# Patient Record
Sex: Male | Born: 1945 | Race: White | Hispanic: No | Marital: Married | Smoking: Former smoker
Health system: Southern US, Community
[De-identification: ages and names within clinical notes are randomized; demographics above are authoritative.]

## PROBLEM LIST (undated history)

## (undated) DIAGNOSIS — M1990 Unspecified osteoarthritis, unspecified site: Secondary | ICD-10-CM

## (undated) DIAGNOSIS — K219 Gastro-esophageal reflux disease without esophagitis: Secondary | ICD-10-CM

## (undated) DIAGNOSIS — E785 Hyperlipidemia, unspecified: Secondary | ICD-10-CM

## (undated) DIAGNOSIS — E7800 Pure hypercholesterolemia, unspecified: Secondary | ICD-10-CM

## (undated) DIAGNOSIS — I10 Essential (primary) hypertension: Secondary | ICD-10-CM

## (undated) DIAGNOSIS — C801 Malignant (primary) neoplasm, unspecified: Secondary | ICD-10-CM

## (undated) DIAGNOSIS — I517 Cardiomegaly: Secondary | ICD-10-CM

## (undated) DIAGNOSIS — H269 Unspecified cataract: Secondary | ICD-10-CM

## (undated) DIAGNOSIS — J309 Allergic rhinitis, unspecified: Secondary | ICD-10-CM

## (undated) DIAGNOSIS — R569 Unspecified convulsions: Secondary | ICD-10-CM

## (undated) DIAGNOSIS — E669 Obesity, unspecified: Secondary | ICD-10-CM

## (undated) DIAGNOSIS — I82409 Acute embolism and thrombosis of unspecified deep veins of unspecified lower extremity: Secondary | ICD-10-CM

## (undated) DIAGNOSIS — I6523 Occlusion and stenosis of bilateral carotid arteries: Secondary | ICD-10-CM

## (undated) HISTORY — DX: Cardiomegaly: I51.7

## (undated) HISTORY — DX: Obesity, unspecified: E66.9

## (undated) HISTORY — DX: Unspecified osteoarthritis, unspecified site: M19.90

## (undated) HISTORY — DX: Unspecified cataract: H26.9

## (undated) HISTORY — PX: CATARACT EXTRACTION, BILATERAL: SHX1313

## (undated) HISTORY — DX: Essential (primary) hypertension: I10

## (undated) HISTORY — DX: Acute embolism and thrombosis of unspecified deep veins of unspecified lower extremity: I82.409

## (undated) HISTORY — DX: Allergic rhinitis, unspecified: J30.9

## (undated) HISTORY — DX: Occlusion and stenosis of bilateral carotid arteries: I65.23

## (undated) HISTORY — DX: Hyperlipidemia, unspecified: E78.5

## (undated) HISTORY — DX: Pure hypercholesterolemia, unspecified: E78.00

---
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
## 2008-04-03 HISTORY — PX: SKIN CANCER EXCISION: SHX779

## 2012-11-22 DIAGNOSIS — I82409 Acute embolism and thrombosis of unspecified deep veins of unspecified lower extremity: Secondary | ICD-10-CM

## 2012-11-22 HISTORY — DX: Acute embolism and thrombosis of unspecified deep veins of unspecified lower extremity: I82.409

## 2013-04-03 HISTORY — PX: TOTAL KNEE ARTHROPLASTY: SHX125

## 2013-04-03 HISTORY — PX: COLONOSCOPY: SHX174

## 2015-04-07 LAB — HEPATIC FUNCTION PANEL
ALT: 54 U/L — AB (ref 10–40)
AST: 31 U/L (ref 14–40)
Alkaline Phosphatase: 73 U/L (ref 25–125)
BILIRUBIN, TOTAL: 0.6 mg/dL

## 2015-04-07 LAB — BASIC METABOLIC PANEL
BUN: 16 mg/dL (ref 4–21)
Creatinine: 1.3 mg/dL (ref 0.6–1.3)
GLUCOSE: 120 mg/dL
Potassium: 4.2 mmol/L (ref 3.4–5.3)
SODIUM: 140 mmol/L (ref 137–147)

## 2015-04-07 LAB — LIPID PANEL
Cholesterol: 155 mg/dL (ref 0–200)
HDL: 30 mg/dL — AB (ref 35–70)
LDL Cholesterol: 90 mg/dL
Triglycerides: 175 mg/dL — AB (ref 40–160)

## 2015-04-07 LAB — CBC AND DIFFERENTIAL
HEMATOCRIT: 47 % (ref 41–53)
HEMOGLOBIN: 16 g/dL (ref 13.5–17.5)
PLATELETS: 276 10*3/uL (ref 150–399)
WBC: 6.3 10*3/mL

## 2015-04-07 LAB — TSH: TSH: 2.26 u[IU]/mL (ref 0.41–5.90)

## 2015-07-09 LAB — HEPATIC FUNCTION PANEL
ALT: 61 U/L — AB (ref 10–40)
AST: 36 U/L (ref 14–40)
Alkaline Phosphatase: 79 U/L (ref 25–125)
BILIRUBIN, TOTAL: 0.6 mg/dL

## 2015-07-09 LAB — BASIC METABOLIC PANEL
BUN: 15 mg/dL (ref 4–21)
Creatinine: 1.2 mg/dL (ref 0.6–1.3)
Glucose: 132 mg/dL
POTASSIUM: 4.2 mmol/L (ref 3.4–5.3)
SODIUM: 141 mmol/L (ref 137–147)

## 2015-07-09 LAB — LIPID PANEL
Cholesterol: 163 mg/dL (ref 0–200)
HDL: 32 mg/dL — AB (ref 35–70)
LDL CALC: 94 mg/dL
TRIGLYCERIDES: 185 mg/dL — AB (ref 40–160)

## 2015-07-09 LAB — CBC AND DIFFERENTIAL
HEMATOCRIT: 49 % (ref 41–53)
Hemoglobin: 16.9 g/dL (ref 13.5–17.5)
Platelets: 285 10*3/uL (ref 150–399)
WBC: 9.5 10^3/mL

## 2015-10-12 DIAGNOSIS — I517 Cardiomegaly: Secondary | ICD-10-CM | POA: Diagnosis not present

## 2015-10-12 DIAGNOSIS — E669 Obesity, unspecified: Secondary | ICD-10-CM | POA: Diagnosis not present

## 2015-10-12 DIAGNOSIS — I6523 Occlusion and stenosis of bilateral carotid arteries: Secondary | ICD-10-CM | POA: Diagnosis not present

## 2015-10-12 DIAGNOSIS — Z1389 Encounter for screening for other disorder: Secondary | ICD-10-CM | POA: Diagnosis not present

## 2015-10-12 DIAGNOSIS — I10 Essential (primary) hypertension: Secondary | ICD-10-CM | POA: Diagnosis not present

## 2015-10-12 DIAGNOSIS — Z136 Encounter for screening for cardiovascular disorders: Secondary | ICD-10-CM | POA: Diagnosis not present

## 2015-10-12 DIAGNOSIS — Z6836 Body mass index (BMI) 36.0-36.9, adult: Secondary | ICD-10-CM | POA: Diagnosis not present

## 2015-10-12 DIAGNOSIS — E7800 Pure hypercholesterolemia, unspecified: Secondary | ICD-10-CM | POA: Diagnosis not present

## 2015-10-12 DIAGNOSIS — Z125 Encounter for screening for malignant neoplasm of prostate: Secondary | ICD-10-CM | POA: Diagnosis not present

## 2015-10-12 DIAGNOSIS — I80203 Phlebitis and thrombophlebitis of unspecified deep vessels of lower extremities, bilateral: Secondary | ICD-10-CM | POA: Diagnosis not present

## 2015-10-12 DIAGNOSIS — Z0000 Encounter for general adult medical examination without abnormal findings: Secondary | ICD-10-CM | POA: Diagnosis not present

## 2015-10-12 LAB — HEPATIC FUNCTION PANEL
ALT: 56 U/L — AB (ref 10–40)
AST: 33 U/L (ref 14–40)
Alkaline Phosphatase: 67 U/L (ref 25–125)
Bilirubin, Total: 0.5 mg/dL

## 2015-10-12 LAB — LIPID PANEL
CHOLESTEROL: 148 mg/dL (ref 0–200)
HDL: 35 mg/dL (ref 35–70)
LDL Cholesterol: 99 mg/dL
TRIGLYCERIDES: 138 mg/dL (ref 40–160)

## 2015-10-12 LAB — BASIC METABOLIC PANEL
BUN: 10 mg/dL (ref 4–21)
CREATININE: 1 mg/dL (ref 0.6–1.3)
Glucose: 123 mg/dL
POTASSIUM: 4.2 mmol/L (ref 3.4–5.3)
Sodium: 140 mmol/L (ref 137–147)

## 2015-10-12 LAB — CBC AND DIFFERENTIAL
HCT: 48 % (ref 41–53)
HEMOGLOBIN: 16.3 g/dL (ref 13.5–17.5)
PLATELETS: 318 10*3/uL (ref 150–399)
WBC: 7 10^3/mL

## 2015-10-12 LAB — PSA: PSA: 2.79

## 2015-10-12 LAB — TSH: TSH: 2.01 u[IU]/mL (ref 0.41–5.90)

## 2015-10-12 LAB — HEMOGLOBIN A1C: HEMOGLOBIN A1C: 6.34

## 2016-02-16 DIAGNOSIS — Z23 Encounter for immunization: Secondary | ICD-10-CM | POA: Diagnosis not present

## 2016-03-22 ENCOUNTER — Non-Acute Institutional Stay: Payer: Medicare Other | Admitting: Internal Medicine

## 2016-03-22 ENCOUNTER — Encounter: Payer: Self-pay | Admitting: Internal Medicine

## 2016-03-22 VITALS — BP 120/60 | HR 83 | Temp 98.3°F | Ht 72.0 in | Wt 245.0 lb

## 2016-03-22 DIAGNOSIS — G250 Essential tremor: Secondary | ICD-10-CM

## 2016-03-22 DIAGNOSIS — Z860100 Personal history of colon polyps, unspecified: Secondary | ICD-10-CM

## 2016-03-22 DIAGNOSIS — I10 Essential (primary) hypertension: Secondary | ICD-10-CM | POA: Diagnosis not present

## 2016-03-22 DIAGNOSIS — E7800 Pure hypercholesterolemia, unspecified: Secondary | ICD-10-CM

## 2016-03-22 DIAGNOSIS — M5442 Lumbago with sciatica, left side: Secondary | ICD-10-CM | POA: Diagnosis not present

## 2016-03-22 DIAGNOSIS — M25552 Pain in left hip: Secondary | ICD-10-CM

## 2016-03-22 DIAGNOSIS — G8929 Other chronic pain: Secondary | ICD-10-CM

## 2016-03-22 DIAGNOSIS — R739 Hyperglycemia, unspecified: Secondary | ICD-10-CM | POA: Diagnosis not present
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
## 2016-03-23 DIAGNOSIS — E7800 Pure hypercholesterolemia, unspecified: Secondary | ICD-10-CM | POA: Diagnosis not present

## 2016-03-23 DIAGNOSIS — I10 Essential (primary) hypertension: Secondary | ICD-10-CM | POA: Diagnosis not present

## 2016-03-23 DIAGNOSIS — G250 Essential tremor: Secondary | ICD-10-CM | POA: Diagnosis not present

## 2016-03-23 DIAGNOSIS — R739 Hyperglycemia, unspecified: Secondary | ICD-10-CM | POA: Diagnosis not present

## 2016-03-23 LAB — CBC AND DIFFERENTIAL
HCT: 48 % (ref 41–53)
Hemoglobin: 16.2 g/dL (ref 13.5–17.5)
Platelets: 256 10*3/uL (ref 150–399)
WBC: 6.7 10^3/mL

## 2016-03-24 DIAGNOSIS — R739 Hyperglycemia, unspecified: Secondary | ICD-10-CM | POA: Diagnosis not present

## 2016-03-24 DIAGNOSIS — E7800 Pure hypercholesterolemia, unspecified: Secondary | ICD-10-CM | POA: Diagnosis not present

## 2016-03-24 DIAGNOSIS — I10 Essential (primary) hypertension: Secondary | ICD-10-CM | POA: Diagnosis not present

## 2016-03-24 DIAGNOSIS — G250 Essential tremor: Secondary | ICD-10-CM | POA: Diagnosis not present

## 2016-03-24 LAB — HEPATIC FUNCTION PANEL
ALT: 38 U/L (ref 10–40)
AST: 25 U/L (ref 14–40)
Alkaline Phosphatase: 64 U/L (ref 25–125)
Bilirubin, Total: 0.5 mg/dL

## 2016-03-24 LAB — BASIC METABOLIC PANEL
BUN: 17 mg/dL (ref 4–21)
Creatinine: 1.1 mg/dL (ref 0.6–1.3)
Glucose: 134 mg/dL
Potassium: 4.2 mmol/L (ref 3.4–5.3)
Sodium: 142 mmol/L (ref 137–147)

## 2016-03-24 LAB — HEMOGLOBIN A1C: Hemoglobin A1C: 6.2

## 2016-03-24 LAB — LIPID PANEL
Cholesterol: 168 mg/dL (ref 0–200)
HDL: 36 mg/dL (ref 35–70)
LDL Cholesterol: 98 mg/dL
Triglycerides: 170 mg/dL — AB (ref 40–160)

## 2016-04-04 ENCOUNTER — Encounter: Payer: Self-pay | Admitting: Internal Medicine

## 2016-04-05 ENCOUNTER — Ambulatory Visit
Admission: RE | Admit: 2016-04-05 | Discharge: 2016-04-05 | Disposition: A | Discharge status: Home / Self Care | Payer: Medicare Other | Source: Ambulatory Visit | Attending: Internal Medicine | Admitting: Internal Medicine

## 2016-04-05 ENCOUNTER — Other Ambulatory Visit: Payer: Self-pay | Admitting: Internal Medicine

## 2016-04-05 DIAGNOSIS — M5442 Lumbago with sciatica, left side: Principal | ICD-10-CM

## 2016-04-05 DIAGNOSIS — G8929 Other chronic pain: Secondary | ICD-10-CM

## 2016-04-05 DIAGNOSIS — M25552 Pain in left hip: Secondary | ICD-10-CM

## 2016-04-05 DIAGNOSIS — M5126 Other intervertebral disc displacement, lumbar region: Secondary | ICD-10-CM | POA: Diagnosis not present

## 2016-04-05 DIAGNOSIS — M545 Low back pain: Principal | ICD-10-CM

## 2016-04-05 DIAGNOSIS — M1612 Unilateral primary osteoarthritis, left hip: Secondary | ICD-10-CM | POA: Diagnosis not present

## 2016-04-05 NOTE — Addendum Note (Signed)
Addended by: Logan Bores on: 04/05/2016 03:32 PM   Modules accepted: Orders

## 2016-04-11 ENCOUNTER — Encounter: Payer: Self-pay | Admitting: Internal Medicine

## 2016-04-11 ENCOUNTER — Other Ambulatory Visit: Payer: Self-pay | Admitting: Internal Medicine

## 2016-04-11 MED ORDER — GABAPENTIN 100 MG PO CAPS
100.0000 mg | ORAL_CAPSULE | Freq: Every day | ORAL | 0 refills | Status: DC
Start: 2016-04-11 — End: 2016-05-08

## 2016-04-11 MED ORDER — LOSARTAN POTASSIUM-HCTZ 100-12.5 MG PO TABS: 1.0000 | ORAL_TABLET | Freq: Every day | ORAL | 3 refills | Status: DC

## 2016-04-11 MED ORDER — ATORVASTATIN CALCIUM 20 MG PO TABS: 20.0000 mg | ORAL_TABLET | Freq: Every day | ORAL | 3 refills | Status: DC

## 2016-05-08 ENCOUNTER — Encounter: Payer: Self-pay | Admitting: Internal Medicine

## 2016-05-08 MED ORDER — GABAPENTIN 100 MG PO CAPS
100.0000 mg | ORAL_CAPSULE | Freq: Every day | ORAL | 0 refills | Status: DC
Start: 2016-05-08 — End: 2016-06-07

## 2016-06-07 ENCOUNTER — Other Ambulatory Visit: Payer: Self-pay | Admitting: Internal Medicine

## 2016-06-07 MED ORDER — GABAPENTIN 100 MG PO CAPS: 100.0000 mg | ORAL_CAPSULE | Freq: Every day | ORAL | 3 refills | Status: DC

## 2016-06-07 NOTE — Telephone Encounter (Signed)
Patient requested a refill via MyChart. Rx was sent to pharmacy electronically for gabapentin 100 mg. #30 with 3 RF.

## 2016-06-21 ENCOUNTER — Encounter: Payer: Self-pay | Admitting: Internal Medicine

## 2016-06-21 ENCOUNTER — Non-Acute Institutional Stay: Payer: Medicare Other | Admitting: Internal Medicine

## 2016-06-21 VITALS — BP 108/60 | HR 79 | Temp 97.9°F | Wt 238.0 lb

## 2016-06-21 DIAGNOSIS — I8390 Asymptomatic varicose veins of unspecified lower extremity: Secondary | ICD-10-CM | POA: Diagnosis not present

## 2016-06-21 DIAGNOSIS — I10 Essential (primary) hypertension: Secondary | ICD-10-CM

## 2016-06-21 DIAGNOSIS — G8929 Other chronic pain: Secondary | ICD-10-CM

## 2016-06-21 DIAGNOSIS — E66811 Obesity, class 1: Secondary | ICD-10-CM

## 2016-06-21 DIAGNOSIS — E669 Obesity, unspecified: Secondary | ICD-10-CM | POA: Diagnosis not present

## 2016-06-21 DIAGNOSIS — R739 Hyperglycemia, unspecified: Secondary | ICD-10-CM

## 2016-06-21 DIAGNOSIS — M5442 Lumbago with sciatica, left side: Secondary | ICD-10-CM

## 2016-06-21 DIAGNOSIS — M25552 Pain in left hip: Secondary | ICD-10-CM

## 2016-06-21 DIAGNOSIS — E7800 Pure hypercholesterolemia, unspecified: Secondary | ICD-10-CM | POA: Diagnosis not present

## 2016-06-21 MED ORDER — ATORVASTATIN CALCIUM 20 MG PO TABS: 20.0000 mg | ORAL_TABLET | Freq: Every day | ORAL | 3 refills | Status: DC

## 2016-06-21 MED ORDER — GABAPENTIN 100 MG PO CAPS: 100.0000 mg | ORAL_CAPSULE | Freq: Every day | ORAL | 3 refills | Status: DC

## 2016-06-21 MED ORDER — LOSARTAN POTASSIUM-HCTZ 100-12.5 MG PO TABS: 1.0000 | ORAL_TABLET | Freq: Every day | ORAL | 3 refills | Status: DC

## 2016-06-21 NOTE — Progress Notes (Signed)
Location:  Occupational psychologist of Service:  Clinic (12)  Provider: Khristina Janota L. Mariea Clonts, D.O., C.M.D.  Code Status: full code--had discussion with him about this today.  Goals of Care:  Advanced Directives 03/22/2016  Does Patient Have a Medical Advance Directive? Yes  Type of Advance Directive -  Does patient want to make changes to medical advance directive? -  Copy of [place] in Chart? No - copy requested   Chief Complaint  Patient presents with  . Medical Management of Chronic Issues    62mth follow-up    HPI: Patient is a 71 y.o. male seen today for medical management of chronic diseases.    When he started the gabapentin, he "blew up like a balloon".  He says he has the sense that his muscle mass is deteriorating and his abdomen is growing.  He's no longer having the urges to eat.  It is working well at keeping his back pain at [place].  It's only been a couple of times and similarly with his hip pain.    He signed up for the balance class that begins in May.  He's also going to the chair class.    HTN:  bp excellent.  Weight down and so is bp.  Usually 696-295 systolic.  Hyperglycemia:  Tries to avoid desserts.  Does not specifically eat a low carb diet, but he does not generally eat a lot of them.    Hyperlipidemia:  Lipids at goal at 28 in Dec.  On atorvastatin.    Past Medical History:  Diagnosis Date  . Allergic rhinitis   . Arthritis   . Carotid atherosclerosis, bilateral   . DVT (deep venous thrombosis) ([place]) 11/22/2012  . High blood pressure   . Hypercholesteremia   . Hyperlipidemia   . Left atrial dilatation   . LVH (left ventricular hypertrophy)   . Obesity     Past Surgical History:  Procedure Laterality Date  . right leg fracture s/p surgical repair with screws Right 1966  . SKIN CANCER EXCISION  2010   from his nose, no recurrence  . TOTAL KNEE ARTHROPLASTY Right 2015    Allergies  Allergen Reactions  . Asa  [Aspirin]     Allergies as of 06/21/2016      Reactions   Asa [aspirin]       Medication List       Accurate as of 06/21/16  8:29 AM. Always use your most recent med list.          atorvastatin 20 MG tablet Commonly known as:  LIPITOR Take 1 tablet (20 mg total) by mouth daily.   gabapentin 100 MG capsule Commonly known as:  NEURONTIN Take 1 capsule (100 mg total) by mouth at bedtime.   losartan-hydrochlorothiazide 100-12.5 MG tablet Commonly known as:  HYZAAR Take 1 tablet by mouth daily.   Vitamin D3 2000 units Tabs Take 1 tablet by mouth daily.       Review of Systems:  Review of Systems  Constitutional: Negative for chills and fever.  HENT: Negative for congestion.   Eyes:       Glasses  Respiratory: Negative for shortness of breath.   Cardiovascular: Negative for chest pain and palpitations.  Gastrointestinal: Negative for abdominal pain.  Genitourinary: Negative for dysuria.  Musculoskeletal: Positive for back pain and joint pain. Negative for falls.  Skin: Negative for rash.  Neurological: Positive for tremors. Negative for dizziness and loss of consciousness.  Psychiatric/Behavioral: Negative for depression.    Health Maintenance  Topic Date Due  . Hepatitis C Screening  21-Apr-1945  . PNA vac Low Risk Adult (2 of 2 - PCV13) 01/02/2016  . COLONOSCOPY  04/04/2023  . TETANUS/TDAP  04/04/2023  . INFLUENZA VACCINE  Completed    Physical Exam: Vitals:   06/21/16 0823  BP: 108/60  Pulse: 79  Temp: 97.9 F (36.6 C)  TempSrc: Oral  SpO2: 95%  Weight: 238 lb (108 kg)   Body mass index is 32.28 kg/m. Physical Exam  Constitutional: He is oriented to person, place, and time. He appears well-developed and well-nourished. No distress.  Cardiovascular: Normal rate, regular rhythm, normal heart sounds and intact distal pulses.   Pulmonary/Chest: Effort normal and breath sounds normal. No respiratory distress.  Musculoskeletal: Normal range of motion.    Ambulates w/o assistive device  Neurological: He is alert and oriented to person, place, and time.  Skin: Skin is warm and dry.  Right posterior calf with small dilated varicose vein that itches at times  Psychiatric: He has a normal mood and affect.    Labs reviewed: Basic Metabolic Panel:  Recent Labs  07/09/15 10/12/15 03/24/16  NA 141 140 142  K 4.2 4.2 4.2  BUN 15 10 17   CREATININE 1.2 1.0 1.1  TSH  --  2.01  --    Liver Function Tests:  Recent Labs  07/09/15 10/12/15 03/24/16 0202  AST 36 33 25  ALT 61* 56* 38  ALKPHOS 79 67 64   No results for input(s): LIPASE, AMYLASE in the last 8760 hours. No results for input(s): AMMONIA in the last 8760 hours. CBC:  Recent Labs  07/09/15 10/12/15 03/23/16  WBC 9.5 7.0 6.7  HGB 16.9 16.3 16.2  HCT 49 48 48  PLT 285 318 256   Lipid Panel:  Recent Labs  07/09/15 10/12/15  CHOL 163 148  HDL 32* 35  LDLCALC 94 99  TRIG 185* 138   Lab Results  Component Value Date   HGBA1C 6.2 03/24/2016    Assessment/Plan 1. Chronic left-sided low back pain with left-sided sciatica - doing better since starting gabapentin, also doing chair exercise class and will begin balance class in may - gabapentin (NEURONTIN) 100 MG capsule; Take 1 capsule (100 mg total) by mouth at bedtime.  Dispense: 90 capsule; Refill: 3  2. Chronic hip pain, left -also improved with gabapentin so cont same as well as exercise regimen - gabapentin (NEURONTIN) 100 MG capsule; Take 1 capsule (100 mg total) by mouth at bedtime.  Dispense: 90 capsule; Refill: 3  3. Essential hypertension, benign -bp at goal with current therapy, did not c/o dizziness - losartan-hydrochlorothiazide (HYZAAR) 100-12.5 MG tablet; Take 1 tablet by mouth daily.  Dispense: 90 tablet; Refill: 3 -check bmp due to diuretic use and gabapentin therapy  4. Hyperglycemia -suspect improved with 7 lb weight loss, cont diet and exercise -will reassess hba1c before his next appt in 3  mos  5. Pure hypercholesterolemia - cont statin therapy then recheck flp before next visit, cont diet and exercise - atorvastatin (LIPITOR) 20 MG tablet; Take 1 tablet (20 mg total) by mouth daily.  Dispense: 90 tablet; Refill: 3  6.  Varicose vein right leg:   -advised that this could become itchier or painful in which case the next intervention would be compression socks/hose -he is to let me know if this occurs  Labs/tests ordered:  Bmp, flp, hba1c before Next appt:  09/20/2016  [facility]  Lynelle Doctor, D.O. [facility] [address],  [address] Cell Phone (Mon-Fri 8am-5pm):  [phone] On Call:  [phone] & follow prompts after 5pm & weekends Office Phone:  [phone] Office Fax:  [phone]

## 2016-07-11 ENCOUNTER — Encounter: Payer: Self-pay | Admitting: Internal Medicine

## 2016-07-11 DIAGNOSIS — I10 Essential (primary) hypertension: Secondary | ICD-10-CM

## 2016-07-11 MED ORDER — LOSARTAN POTASSIUM-HCTZ 100-12.5 MG PO TABS: 1.0000 | ORAL_TABLET | Freq: Every day | ORAL | 3 refills | Status: DC

## 2016-07-12 ENCOUNTER — Encounter: Payer: Self-pay | Admitting: Internal Medicine

## 2016-07-12 DIAGNOSIS — I10 Essential (primary) hypertension: Secondary | ICD-10-CM

## 2016-07-12 MED ORDER — LOSARTAN POTASSIUM-HCTZ 100-12.5 MG PO TABS: 1.0000 | ORAL_TABLET | Freq: Every day | ORAL | 3 refills | Status: DC

## 2016-07-13 ENCOUNTER — Encounter: Payer: Self-pay | Admitting: Internal Medicine

## 2016-09-12 ENCOUNTER — Encounter: Payer: Self-pay | Admitting: Internal Medicine

## 2016-09-12 DIAGNOSIS — R739 Hyperglycemia, unspecified: Secondary | ICD-10-CM | POA: Diagnosis not present

## 2016-09-12 DIAGNOSIS — E7800 Pure hypercholesterolemia, unspecified: Secondary | ICD-10-CM | POA: Diagnosis not present

## 2016-09-12 DIAGNOSIS — E785 Hyperlipidemia, unspecified: Secondary | ICD-10-CM | POA: Diagnosis not present

## 2016-09-12 DIAGNOSIS — I10 Essential (primary) hypertension: Secondary | ICD-10-CM | POA: Diagnosis not present

## 2016-09-12 DIAGNOSIS — D649 Anemia, unspecified: Secondary | ICD-10-CM | POA: Diagnosis not present

## 2016-09-12 DIAGNOSIS — E119 Type 2 diabetes mellitus without complications: Secondary | ICD-10-CM | POA: Diagnosis not present

## 2016-09-12 LAB — BASIC METABOLIC PANEL WITH GFR
BUN: 11 (ref 4–21)
Creatinine: 1.1 (ref 0.6–1.3)
Glucose: 116
Potassium: 4.1 (ref 3.4–5.3)
Sodium: 140 (ref 137–147)

## 2016-09-12 LAB — LIPID PANEL
Cholesterol: 140 (ref 0–200)
HDL: 35 (ref 35–70)
LDL Cholesterol: 79
Triglycerides: 127 (ref 40–160)

## 2016-09-12 LAB — HEMOGLOBIN A1C: Hemoglobin A1C: 5.6

## 2016-09-15 ENCOUNTER — Encounter: Payer: Self-pay | Admitting: Internal Medicine

## 2016-09-20 ENCOUNTER — Encounter: Payer: Self-pay | Admitting: Internal Medicine

## 2016-09-20 ENCOUNTER — Non-Acute Institutional Stay: Payer: Medicare Other | Admitting: Internal Medicine

## 2016-09-20 VITALS — BP 128/80 | HR 74 | Temp 98.9°F | Wt 236.0 lb

## 2016-09-20 DIAGNOSIS — L821 Other seborrheic keratosis: Secondary | ICD-10-CM | POA: Diagnosis not present

## 2016-09-20 DIAGNOSIS — M25552 Pain in left hip: Secondary | ICD-10-CM

## 2016-09-20 DIAGNOSIS — K635 Polyp of colon: Secondary | ICD-10-CM

## 2016-09-20 DIAGNOSIS — I10 Essential (primary) hypertension: Secondary | ICD-10-CM | POA: Diagnosis not present

## 2016-09-20 DIAGNOSIS — E7800 Pure hypercholesterolemia, unspecified: Secondary | ICD-10-CM | POA: Diagnosis not present

## 2016-09-20 DIAGNOSIS — M48062 Spinal stenosis, lumbar region with neurogenic claudication: Secondary | ICD-10-CM

## 2016-09-20 DIAGNOSIS — G8929 Other chronic pain: Secondary | ICD-10-CM | POA: Diagnosis not present

## 2016-09-20 DIAGNOSIS — M5442 Lumbago with sciatica, left side: Secondary | ICD-10-CM | POA: Diagnosis not present

## 2016-09-20 DIAGNOSIS — R739 Hyperglycemia, unspecified: Secondary | ICD-10-CM

## 2016-09-20 NOTE — Progress Notes (Addendum)
Location:  [facility] Provider:  Taryn Nave L. Mariea Clonts, D.O., C.M.D.  Goals of Care: still need to discuss DNR Advanced Directives 09/20/2016  Does Patient Have a Medical Advance Directive? Yes  Type of Advance Directive [place]  Does patient want to make changes to medical advance directive? No - Patient declined  Copy of Joy in Chart? -   Chief Complaint  Patient presents with  . Medical Management of Chronic Issues    13mth follow-up    HPI: Patient is a 71 y.o. male seen today for medical management of chronic diseases.    Left temple lesion has been there for years--darkens in summer and lightens in winter, but has not grown or otherwise changed over years.    Nose extra sensitive to sun.  He's been using sunscreen.  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  Advised he should see a dermatologist annually for skin checks due to this history.    Due for his colonoscopy in October per his initial visit.  He went through a period where he was increasaingly constipated. Took a laxative and got around it.  That had him worried.  He is due to for his next.  Dr. Lezlie Octave was the GI physician.  Had polyps on three prior occasions.  Pt's wife has requested for the GI records to be sent.  Back in particular is bothersome.  Gabapentin works especially well much of the time.  hutst if there is pressure on his back--goes away if lies down.  If he wants to walk or sit, it's bothersome.  Can be really bad.  Had a couple of days where he took naproxen.  He did try two gabapentin instead of one.  When he took it a few days in a row, his muscles felt weak, he was nauseous and fatigued. He almost had to leave the reception at a wedding due to pain.  He's afraid to go anywhere b/c of this.  He does notice gabapentin affects balance, but he's doing well in the balance class.    Hip painful when he lies down.  Uses naproxen there.     He did switch his statin to hs and now his LDL has trended down 20 pts.  His diet is consistent.    Hyperglycemia has improved.  hba1c now 5.6.    Past Medical History:  Diagnosis Date  . Allergic rhinitis   . Arthritis   . Carotid atherosclerosis, bilateral   . DVT (deep venous thrombosis) ([place]) 11/22/2012  . High blood pressure   . Hypercholesteremia   . Hyperlipidemia   . Left atrial dilatation   . LVH (left ventricular hypertrophy)   . Obesity     Past Surgical History:  Procedure Laterality Date  . right leg fracture s/p surgical repair with screws Right 1966  . SKIN CANCER EXCISION  2010   from his nose, no recurrence  . TOTAL KNEE ARTHROPLASTY Right 2015    Allergies  Allergen Reactions  . Asa [Aspirin]     Allergies as of 09/20/2016      Reactions   Asa [aspirin]       Medication List       Accurate as of 09/20/16  8:36 AM. Always use your most recent med list.          atorvastatin 20 MG tablet Commonly known as:  LIPITOR Take 1 tablet (20 mg total) by mouth  daily.   gabapentin 100 MG capsule Commonly known as:  NEURONTIN Take 1 capsule (100 mg total) by mouth at bedtime.   losartan-hydrochlorothiazide 100-12.5 MG tablet Commonly known as:  HYZAAR Take 1 tablet by mouth daily.   Vitamin D3 2000 units Tabs Take 1 tablet by mouth daily.       Review of Systems:  Review of Systems  Constitutional: Negative for chills, fever and malaise/fatigue.  HENT: Positive for hearing loss. Negative for congestion.   Eyes: Negative for blurred vision.       Glasses  Respiratory: Negative for shortness of breath.   Cardiovascular: Negative for chest pain and palpitations.  Gastrointestinal: Positive for constipation. Negative for abdominal pain, blood in stool, diarrhea and melena.  Genitourinary: Negative for dysuria.  Musculoskeletal: Positive for back pain and joint pain. Negative for falls and myalgias.  Skin:       Left temple lesion    Neurological: Positive for dizziness, tingling and sensory change. Negative for loss of consciousness and weakness.  Endo/Heme/Allergies: Does not bruise/bleed easily.  Psychiatric/Behavioral: Negative for depression and memory loss. The patient is nervous/anxious.     Health Maintenance  Topic Date Due  . Hepatitis C Screening  03-30-1946  . PNA vac Low Risk Adult (2 of 2 - PCV13) 01/02/2016  . INFLUENZA VACCINE  11/01/2016  . COLONOSCOPY  04/04/2023  . TETANUS/TDAP  04/04/2023    Physical Exam: Vitals:   09/20/16 0826  BP: 128/80  Pulse: 74  Temp: 98.9 F (37.2 C)  TempSrc: Oral  SpO2: 96%  Weight: 236 lb (107 kg)   Body mass index is 32.01 kg/m. Physical Exam  Constitutional: He is oriented to person, place, and time. He appears well-developed and well-nourished. No distress.  HENT:  Head: Normocephalic and atraumatic.  Cardiovascular: Normal rate, regular rhythm, normal heart sounds and intact distal pulses.   Pulmonary/Chest: Effort normal and breath sounds normal. No respiratory distress.  Musculoskeletal: Normal range of motion. He exhibits tenderness.  Over lower lumbar spine and SI regions bilaterally  Neurological: He is alert and oriented to person, place, and time.  Skin: Skin is warm and dry.  Psychiatric: He has a normal mood and affect.    Labs reviewed: Basic Metabolic Panel:  Recent Labs  10/12/15 03/24/16 09/12/16 0800  NA 140 142 140  K 4.2 4.2 4.1  BUN 10 17 11   CREATININE 1.0 1.1 1.1  TSH 2.01  --   --    Liver Function Tests:  Recent Labs  10/12/15 03/24/16 0202  AST 33 25  ALT 56* 38  ALKPHOS 67 64   No results for input(s): LIPASE, AMYLASE in the last 8760 hours. No results for input(s): AMMONIA in the last 8760 hours. CBC:  Recent Labs  10/12/15 03/23/16  WBC 7.0 6.7  HGB 16.3 16.2  HCT 48 48  PLT 318 256   Lipid Panel:  Recent Labs  10/12/15 09/12/16 0800  CHOL 148 140  HDL 35 35  LDLCALC 99 79  TRIG 138 127    Lab Results  Component Value Date   HGBA1C 5.6 09/12/2016    Assessment/Plan 1. Polyp of colon, unspecified part of colon, unspecified type - historically on 3 prior cscopes, his wife requested these records from GI, but they have not been received by [place] -needs f/u cscope, did also have increased constipation for a period of time resolved with a laxative use - Ambulatory referral to Gastroenterology  2. Spinal stenosis of lumbar region with  neurogenic claudication - cont current gabapentin 100mg  only--higher doses lead to balance problems, nausea--not tolerated -he was not interested in lyrica unless I could give some sort of guarantee it would help him more than gabapentin - Ambulatory referral to Neurosurgery at his request - he seems to want a quick solution and I"m not sure his spinal stenosis and protruding disc can be fixed so easily  3. Seborrheic keratosis -left temple--stable, monitor this -advised due to his prior skin cancer on his nose, that he should follow with dermatology annually   4. Essential hypertension, benign -bp is well controlled, cont hyzaar which has been effective and well tolerated  5. Chronic left-sided low back pain with left-sided sciatica -he has a disc protrusion and a spinal stenosis noted on the MRI we did a few months ago -cont low dose gabapentin  6. Chronic hip pain, left -I believe this is actually his back based on results of imaging obtained, but he continues to call it hip pain  7. Hyperglycemia -cont to work on diet, has improved  8. Pure hypercholesterolemia -cont diet, exercise and lipitor therapy in the evening which has helped to trend down LDL--at goal considering now cad or diabetes  Labs/tests ordered:  No new--labs just done and normal F/u 6 mos med mgt   Leidi Astle L. Deonna Krummel, D.O. [facility] [address], [place] [address] Cell Phone (Mon-Fri 8am-5pm):   [phone] On Call:  [phone] & follow prompts after 5pm & weekends Office Phone:  [phone] Office Fax:  [phone]

## 2016-09-25 ENCOUNTER — Telehealth: Payer: Self-pay | Admitting: Gastroenterology

## 2016-10-02 ENCOUNTER — Encounter: Payer: Self-pay | Admitting: Gastroenterology

## 2016-10-02 NOTE — Telephone Encounter (Signed)
Dr. Havery Moros reviewed records and stated recall colon 01/2024 unless family hx of colon cancer and could schedule OV to clarify history.   Scheduled appt with patient for 11-21-16.  Patient notified

## 2016-10-16 ENCOUNTER — Other Ambulatory Visit: Payer: Self-pay | Admitting: Neurosurgery

## 2016-10-16 DIAGNOSIS — I10 Essential (primary) hypertension: Secondary | ICD-10-CM | POA: Diagnosis not present

## 2016-10-16 DIAGNOSIS — M7138 Other bursal cyst, other site: Secondary | ICD-10-CM | POA: Diagnosis not present

## 2016-10-16 DIAGNOSIS — Z6832 Body mass index (BMI) 32.0-32.9, adult: Secondary | ICD-10-CM | POA: Diagnosis not present

## 2016-10-27 ENCOUNTER — Ambulatory Visit
Admission: RE | Admit: 2016-10-27 | Discharge: 2016-10-27 | Disposition: A | Discharge status: Home / Self Care | Payer: Medicare Other | Source: Ambulatory Visit | Attending: Neurosurgery | Admitting: Neurosurgery

## 2016-10-27 DIAGNOSIS — M48061 Spinal stenosis, lumbar region without neurogenic claudication: Secondary | ICD-10-CM | POA: Diagnosis not present

## 2016-10-27 DIAGNOSIS — M7138 Other bursal cyst, other site: Secondary | ICD-10-CM

## 2016-10-30 DIAGNOSIS — M7138 Other bursal cyst, other site: Secondary | ICD-10-CM | POA: Diagnosis not present

## 2016-10-30 DIAGNOSIS — Z6832 Body mass index (BMI) 32.0-32.9, adult: Secondary | ICD-10-CM | POA: Diagnosis not present

## 2016-10-31 ENCOUNTER — Other Ambulatory Visit: Payer: Self-pay | Admitting: Neurosurgery

## 2016-11-02 ENCOUNTER — Telehealth: Payer: Self-pay

## 2016-11-02 DIAGNOSIS — H9190 Unspecified hearing loss, unspecified ear: Secondary | ICD-10-CM

## 2016-11-02 NOTE — Telephone Encounter (Signed)
Has appt with [facility] 11/16/16 needs referral from PCP decrease hearing. Phone [phone] fax [phone]

## 2016-11-02 NOTE — Telephone Encounter (Signed)
Referral placed.

## 2016-11-16 DIAGNOSIS — H903 Sensorineural hearing loss, bilateral: Secondary | ICD-10-CM | POA: Diagnosis not present

## 2016-11-21 ENCOUNTER — Ambulatory Visit (INDEPENDENT_AMBULATORY_CARE_PROVIDER_SITE_OTHER): Payer: Medicare Other | Admitting: Gastroenterology

## 2016-11-21 ENCOUNTER — Encounter: Payer: Self-pay | Admitting: Gastroenterology

## 2016-11-21 VITALS — BP 120/78 | HR 90 | Ht 72.0 in | Wt 238.0 lb

## 2016-11-21 DIAGNOSIS — Z86010 Personal history of colonic polyps: Secondary | ICD-10-CM | POA: Diagnosis not present

## 2016-11-21 DIAGNOSIS — Z800 Family history of malignant neoplasm of digestive organs: Secondary | ICD-10-CM

## 2016-11-21 NOTE — Progress Notes (Signed)
HPI :  71 y/o male with a history of DVT, obesity, LVH, here to discuss colon cancer screening, referred by Dr. Hollace Kinnier.  Father had colon cancer - diagnosed around age 92s or so. No other known FH of CRC. Father also had [place] but no evidence of cirrhosis per patient. He denies any routine bowel habits changes. No blood in the stools. No abdominal pains. He has occasional constipation and loose stools periodically. HE has been taking gabapentin which he thinks caused constipation initially, but has not been bothering him recently. He had a colonoscopy in 2011 showing 2 small adenomas. Last colonoscopy in 2015 without any polyps. He states he was told by his last GI he needed a colonoscopy every 3 years. Bowel prep was reported to be "good" on both colonoscopies.  History of DVT in 2014 following knee replacement, no anticoagulation. Prior tobacco use but quit  Colonoscopy 03/2010 - 2 x 49mm adenomas Colonoscopy 01/2014 - no polyps, diverticulosis, internal hemorrhoids     Past Medical History:  Diagnosis Date  . Allergic rhinitis   . Arthritis   . Carotid atherosclerosis, bilateral   . DVT (deep venous thrombosis) ([place]) 11/22/2012  . High blood pressure   . Hypercholesteremia   . Hyperlipidemia   . Left atrial dilatation   . LVH (left ventricular hypertrophy)   . Obesity      Past Surgical History:  Procedure Laterality Date  . right leg fracture s/p surgical repair with screws Right 1966  . SKIN CANCER EXCISION  2010   from his nose, no recurrence  . TOTAL KNEE ARTHROPLASTY Right 2015   Family History  Problem Relation Age of Onset  . Alzheimer's disease Mother   . Cancer Mother        Skin  . Alzheimer's disease Father   . Cancer Father        colon, liver, skin  . Colon polyps Father   . Colon cancer Father    Social History  Substance Use Topics  . Smoking status: Former Smoker    Packs/day: 1.00    Years: 20.00    Types: Cigarettes  . Smokeless tobacco:  Never Used  . Alcohol use No   Current Outpatient Prescriptions  Medication Sig Dispense Refill  . atorvastatin (LIPITOR) 20 MG tablet Take 1 tablet (20 mg total) by mouth daily. 90 tablet 3  . Cholecalciferol (VITAMIN D3) 2000 units TABS Take 1 tablet by mouth daily.    Marland Kitchen gabapentin (NEURONTIN) 100 MG capsule Take 1 capsule (100 mg total) by mouth at bedtime. 90 capsule 3  . losartan-hydrochlorothiazide (HYZAAR) 100-12.5 MG tablet Take 1 tablet by mouth daily. 90 tablet 3   No current facility-administered medications for this visit.    Allergies  Allergen Reactions  . Asa [Aspirin]      Review of Systems: All systems reviewed and negative except where noted in HPI.     Lab Results  Component Value Date   WBC 6.7 03/23/2016   HGB 16.2 03/23/2016   HCT 48 03/23/2016   PLT 256 03/23/2016    Physical Exam: BP 120/78   Pulse 90   Ht 6' (1.829 m)   Wt 238 lb (108 kg)   SpO2 98%   BMI 32.28 kg/m  Constitutional: Pleasant,well-developed, male in no acute distress. HEENT: Normocephalic and atraumatic. Conjunctivae are normal. No scleral icterus. Neck supple.  Cardiovascular: Normal rate, regular rhythm.  Pulmonary/chest: Effort normal and breath sounds normal. No wheezing, rales or rhonchi.  Abdominal: Soft, nondistended, nontender. There are no masses palpable. No hepatomegaly. Extremities: no edema Lymphadenopathy: No cervical adenopathy noted. Neurological: Alert and oriented to person place and time. Skin: Skin is warm and dry. No rashes noted. Psychiatric: Normal mood and affect. Behavior is normal.   ASSESSMENT AND PLAN: 71 year old male with history as outlined above, in consultation to discuss timing of his next colonoscopy.  While his father had colon cancer, it was diagnosed in age 33s, thus the patient should be screened at frequency of an average risk patient, or based on his history of polyps, per tri-service guidelines. The patient had 2 small adenomas in  2011, and a follow up colonoscopy in 2015 was normal. Based on current guidelines, he is not due for a colonoscopy until 2025.  The patient was a bit surprised by this, he was told he needed a colonoscopy every 3 years by his last GI provider, which is far too frequent, especially with no polyps on his last exam. I reviewed the tri-service guidelines with him reguarding this issue. He agreed that in the absence of any symptoms that colonoscopy is not needed currently, although he is not comfortable with waiting 10 years in between his exams, and is requesting to do this when is 5 years out from his last exam. For peace of mind I will offer this to him, we'll put in a recall for his next exam in 01/2019.   Wittmann Cellar, MD [facility] Pager [phone]  CC: Gayland Curry, DO

## 2016-11-24 NOTE — Pre-Procedure Instructions (Signed)
Colton Hart  11/24/2016      [id number] [place], [place] [address] [place] [address] Phone: [phone] Fax: [phone]    Your procedure is scheduled on Thursday, August 30th    Report to [facility] Admitting at 1:05 PM              (posted surgery time 3:05p -  5:01p                Call this number if you have problems the morning of surgery:  [phone], for other questions call [phone] Mon-Fri from 8a-4p   Remember:              4-5 days prior to surgery, STOP TAKING any Vitamins, Herbal Supplements, Anti-inflammatories.   Do not eat food or drink liquids after midnight Wednesday.   Take these medicines the morning of surgery with A SIP OF WATER : Gabapentin   Do not wear jewelry - no rings or watches.  Do not wear lotions, colognes or deoderant.             Men may shave face and neck.   Do not bring valuables to the hospital.  [facility] is not responsible for any belongings or valuables.  Contacts, dentures or bridgework may not be worn into surgery.  Leave your suitcase in the car.  After surgery it may be brought to your room.  For patients admitted to the hospital, discharge time will be determined by your treatment team.  Please read over the following fact sheets that you were given. Pain Booklet, MRSA Information and Surgical Site Infection Prevention       [place]- Preparing For Surgery  Before surgery, you can play an important role. Because skin is not sterile, your skin needs to be as free of germs as possible. You can reduce the number of germs on your skin by washing with CHG (chlorahexidine gluconate) Soap before surgery.  CHG is an antiseptic cleaner which kills germs and bonds with the skin to continue killing germs even after washing.  Please do not use if you have an allergy to CHG or antibacterial soaps. If your skin becomes reddened/irritated stop using the CHG.  Do not shave  (including legs and underarms) for at least 48 hours prior to first CHG shower. It is OK to shave your face.  Please follow these instructions carefully.   1. Shower the NIGHT BEFORE SURGERY and the MORNING OF SURGERY with CHG.   2. If you chose to wash your hair, wash your hair first as usual with your normal shampoo.  3. After you shampoo, rinse your hair and body thoroughly to remove the shampoo.  4. Use CHG as you would any other liquid soap. You can apply CHG directly to the skin and wash gently with a scrungie or a clean washcloth.   5. Apply the CHG Soap to your body ONLY FROM THE NECK DOWN.  Do not use on open wounds or open sores. Avoid contact with your eyes, ears, mouth and genitals (private parts). Wash genitals (private parts) with your normal soap.  6. Wash thoroughly, paying special attention to the area where your surgery will be performed.  7. Thoroughly rinse your body with warm water from the neck down.  8. DO NOT shower/wash with your normal soap after using and rinsing off the CHG Soap.  9. Pat yourself dry with a CLEAN TOWEL.  10. Wear CLEAN PAJAMAS   11. Place CLEAN SHEETS on your bed the night of your first shower and DO NOT SLEEP WITH PETS.    Day of Surgery: Do not apply any deodorants/lotions. Please wear clean clothes to the hospital/surgery center.

## 2016-11-27 ENCOUNTER — Encounter (HOSPITAL_COMMUNITY): Payer: Self-pay

## 2016-11-27 ENCOUNTER — Encounter (HOSPITAL_COMMUNITY)
Admission: RE | Admit: 2016-11-27 | Discharge: 2016-11-27 | Disposition: A | Discharge status: Home / Self Care | Payer: Medicare Other | Source: Ambulatory Visit | Attending: Neurosurgery | Admitting: Neurosurgery

## 2016-11-27 DIAGNOSIS — M7138 Other bursal cyst, other site: Secondary | ICD-10-CM | POA: Diagnosis not present

## 2016-11-27 DIAGNOSIS — Z01812 Encounter for preprocedural laboratory examination: Secondary | ICD-10-CM | POA: Insufficient documentation

## 2016-11-27 DIAGNOSIS — I4510 Unspecified right bundle-branch block: Secondary | ICD-10-CM | POA: Diagnosis not present

## 2016-11-27 DIAGNOSIS — Z01810 Encounter for preprocedural cardiovascular examination: Secondary | ICD-10-CM | POA: Diagnosis not present

## 2016-11-27 HISTORY — DX: Unspecified convulsions: R56.9

## 2016-11-27 HISTORY — DX: Malignant (primary) neoplasm, unspecified: C80.1

## 2016-11-27 HISTORY — DX: Gastro-esophageal reflux disease without esophagitis: K21.9

## 2016-11-27 LAB — CBC
HCT: 45.3 % (ref 39.0–52.0)
Hemoglobin: 15.6 g/dL (ref 13.0–17.0)
MCH: 32.9 pg (ref 26.0–34.0)
MCHC: 34.4 g/dL (ref 30.0–36.0)
MCV: 95.6 fL (ref 78.0–100.0)
Platelets: 243 10*3/uL (ref 150–400)
RBC: 4.74 MIL/uL (ref 4.22–5.81)
RDW: 13.8 % (ref 11.5–15.5)
WBC: 8.8 10*3/uL (ref 4.0–10.5)

## 2016-11-27 LAB — BASIC METABOLIC PANEL
Anion gap: 9 (ref 5–15)
BUN: 13 mg/dL (ref 6–20)
CALCIUM: 9.4 mg/dL (ref 8.9–10.3)
CO2: 24 mmol/L (ref 22–32)
Chloride: 107 mmol/L (ref 101–111)
Creatinine, Ser: 1.18 mg/dL (ref 0.61–1.24)
GFR calc Af Amer: >60 mL/min (ref >60)
GLUCOSE: 108 mg/dL — AB (ref 65–99)
Potassium: 3.9 mmol/L (ref 3.5–5.1)
Sodium: 140 mmol/L (ref 135–145)

## 2016-11-27 LAB — SURGICAL PCR SCREEN
MRSA, PCR: NEGATIVE
STAPHYLOCOCCUS AUREUS: NEGATIVE

## 2016-11-27 NOTE — Pre-Procedure Instructions (Addendum)
Garron Eline  11/27/2016      [id number] - [place], [place] [address] [place] [address] Phone: [phone] Fax: [phone]    Your procedure is scheduled on Thursday, August 30th    Report to [facility] Admitting   at 1:00 PM                         Call this number if you have problems the morning of surgery:  [phone], for other questions call [phone] Mon-Fri from 8a-4p   Remember:              7 days prior to surgery, STOP TAKING all Vitamins, Herbal Supplements, Anti-inflammatories.fish oil ,aspirin   Do not eat food or drink liquids after midnight Wednesday.   Take these medicines the morning of surgery with A SIP OF WATER : Gabapentin   Do not wear jewelry - no rings or watches.  Do not wear lotions, colognes or deoderant.             Men may shave face and neck.   Do not bring valuables to the hospital.  [facility] is not responsible for any belongings or valuables.  Contacts, dentures or bridgework may not be worn into surgery.  Leave your suitcase in the car.  After surgery it may be brought to your room.  For patients admitted to the hospital, discharge time will be determined by your treatment team.  Please read over the following fact sheets that you were given. Pain Booklet, MRSA Information and Surgical Site Infection Prevention       [place]- Preparing For Surgery  Before surgery, you can play an important role. Because skin is not sterile, your skin needs to be as free of germs as possible. You can reduce the number of germs on your skin by washing with CHG (chlorahexidine gluconate) Soap before surgery.  CHG is an antiseptic cleaner which kills germs and bonds with the skin to continue killing germs even after washing.  Please do not use if you have an allergy to CHG or antibacterial soaps. If your skin becomes reddened/irritated stop using the CHG.  Do not shave (including legs and  underarms) for at least 48 hours prior to first CHG shower. It is OK to shave your face.  Please follow these instructions carefully.   1. Shower the NIGHT BEFORE SURGERY and the MORNING OF SURGERY with CHG.   2. If you chose to wash your hair, wash your hair first as usual with your normal shampoo.  3. After you shampoo, rinse your hair and body thoroughly to remove the shampoo.  4. Use CHG as you would any other liquid soap. You can apply CHG directly to the skin and wash gently with a scrungie or a clean washcloth.   5. Apply the CHG Soap to your body ONLY FROM THE NECK DOWN.  Do not use on open wounds or open sores. Avoid contact with your eyes, ears, mouth and genitals (private parts). Wash genitals (private parts) with your normal soap.  6. Wash thoroughly, paying special attention to the area where your surgery will be performed.  7. Thoroughly rinse your body with warm water from the neck down.  8. DO NOT shower/wash with your normal soap after using and rinsing off the CHG Soap.  9. Pat yourself dry with a CLEAN TOWEL.   10. Wear CLEAN PAJAMAS  11. Place CLEAN SHEETS on your bed the night of your first shower and DO NOT SLEEP WITH PETS.    Day of Surgery: Do not apply any deodorants/lotions. Please wear clean clothes to the hospital/surgery center.

## 2016-11-28 NOTE — Progress Notes (Addendum)
Anesthesia Chart Review:  Pt is a 71 year old male scheduled for L4-5 laminectomy for facet/synovial cyst on 11/30/2016 with Ashok Pall, M.D.  - PCP is Hollace Kinnier, DO - Saw cardiologist Sonda Primes, MD in [place], [place] in 2014 for eval prior to TKA.  Echo and stress ordered, results below. Pt does not routinely see cardiology.  PMH includes: HTN, hyperlipidemia, DVT (2014), seizures (in 20s), carotid atherosclerosis, GERD. Former smoker. BMI 33.5. Sp TKA 2015  Medications include: Lipitor, losartan-HCTZ  Preoperative labs reviewed.  EKG 11/27/16: NSR. LAD. RBBB  Nuclear stress test 09/17/12 ([facility] in [place], [place]):  1. Normal pharmacologic nuclear stress test. 2. Images reveal no evidence for myocardial ischemia or myocardial infarction. 3. There is normal LV wall motion, EF 70%.  Echo 09/16/12 ([facility] in [place], [place]):  1. Technically limited and abnormal echo  2. Normal LV size with normal LV function and EF 50-55%.  3. Normal RV size and function. 4. LA dilatation, normal RA. 5. Aortic valve is normal. 6. Mitral valve is normal. Trace mitral regurgitation. 7. Tricuspid valve is normal. Trace tricuspid regurgitation. 8. Pulmonary valve is normal. Trace pulmonary regurgitation. 9. No pericardial effusion.  Carotid duplex 06/05/11 ([facility] in [place], [place]) 1. There is minimal amount of calcified plaque at the carotid bifurcations extending into the proximal L ICA 2. There is antegrade flow vertebral arteries with triphasic subclavian artery waveform  If no changes, I anticipate pt can proceed with surgery as scheduled.   Willeen Cass, FNP-BC [facility] Short Stay Surgical Center/Anesthesiology Phone: [phone] 11/28/2016 3:06 PM

## 2016-11-30 ENCOUNTER — Encounter (HOSPITAL_COMMUNITY)
Admission: RE | Disposition: A | Discharge status: Home / Self Care | Payer: Self-pay | Source: Ambulatory Visit | Attending: Neurosurgery

## 2016-11-30 ENCOUNTER — Inpatient Hospital Stay (HOSPITAL_COMMUNITY): Payer: Medicare Other

## 2016-11-30 ENCOUNTER — Encounter (HOSPITAL_COMMUNITY): Payer: Self-pay | Admitting: Urology

## 2016-11-30 ENCOUNTER — Ambulatory Visit (HOSPITAL_COMMUNITY)
Admission: RE | Admit: 2016-11-30 | Discharge: 2016-12-01 | Disposition: A | Discharge status: Home / Self Care | Payer: Medicare Other | Source: Ambulatory Visit | Attending: Neurosurgery | Admitting: Neurosurgery

## 2016-11-30 ENCOUNTER — Inpatient Hospital Stay (HOSPITAL_COMMUNITY): Payer: Medicare Other | Admitting: Emergency Medicine

## 2016-11-30 ENCOUNTER — Inpatient Hospital Stay (HOSPITAL_COMMUNITY): Payer: Medicare Other | Admitting: Anesthesiology

## 2016-11-30 DIAGNOSIS — M5126 Other intervertebral disc displacement, lumbar region: Secondary | ICD-10-CM | POA: Diagnosis not present

## 2016-11-30 DIAGNOSIS — I10 Essential (primary) hypertension: Secondary | ICD-10-CM | POA: Insufficient documentation

## 2016-11-30 DIAGNOSIS — Z886 Allergy status to analgesic agent status: Secondary | ICD-10-CM | POA: Diagnosis not present

## 2016-11-30 DIAGNOSIS — M7138 Other bursal cyst, other site: Secondary | ICD-10-CM | POA: Diagnosis not present

## 2016-11-30 DIAGNOSIS — Z87891 Personal history of nicotine dependence: Secondary | ICD-10-CM | POA: Insufficient documentation

## 2016-11-30 DIAGNOSIS — M5442 Lumbago with sciatica, left side: Secondary | ICD-10-CM | POA: Diagnosis not present

## 2016-11-30 DIAGNOSIS — Z419 Encounter for procedure for purposes other than remedying health state, unspecified: Secondary | ICD-10-CM

## 2016-11-30 DIAGNOSIS — M961 Postlaminectomy syndrome, not elsewhere classified: Secondary | ICD-10-CM | POA: Diagnosis not present

## 2016-11-30 DIAGNOSIS — R739 Hyperglycemia, unspecified: Secondary | ICD-10-CM | POA: Diagnosis not present

## 2016-11-30 HISTORY — PX: LUMBAR LAMINECTOMY/DECOMPRESSION MICRODISCECTOMY: SHX5026

## 2016-11-30 SURGERY — LUMBAR LAMINECTOMY/DECOMPRESSION MICRODISCECTOMY 1 LEVEL
Anesthesia: General | Site: Spine Lumbar | Laterality: Left

## 2016-11-30 MED ORDER — ACETAMINOPHEN 500 MG PO TABS
1000.0000 mg | ORAL_TABLET | Freq: Four times a day (QID) | ORAL | Status: AC
Start: 2016-11-30 — End: 2016-12-01
  Administered 2016-11-30 – 2016-12-01 (×3): 1000 mg via ORAL
  Filled 2016-11-30 (×3): qty 2

## 2016-11-30 MED ORDER — DEXAMETHASONE SODIUM PHOSPHATE 10 MG/ML IJ SOLN
INTRAMUSCULAR | Status: AC
  Filled 2016-11-30: qty 1

## 2016-11-30 MED ORDER — PHENYLEPHRINE 40 MCG/ML (10ML) SYRINGE FOR IV PUSH (FOR BLOOD PRESSURE SUPPORT)
PREFILLED_SYRINGE | INTRAVENOUS | Status: AC
  Filled 2016-11-30: qty 10

## 2016-11-30 MED ORDER — PROPOFOL 10 MG/ML IV BOLUS
INTRAVENOUS | Status: AC
  Filled 2016-11-30: qty 20

## 2016-11-30 MED ORDER — MIDAZOLAM HCL 2 MG/2ML IJ SOLN
INTRAMUSCULAR | Status: AC
  Filled 2016-11-30: qty 2

## 2016-11-30 MED ORDER — POTASSIUM CHLORIDE IN NACL 20-0.9 MEQ/L-% IV SOLN: INTRAVENOUS | Status: DC

## 2016-11-30 MED ORDER — SODIUM CHLORIDE 0.9% FLUSH: 3.0000 mL | INTRAVENOUS | Status: DC | PRN

## 2016-11-30 MED ORDER — LOSARTAN POTASSIUM 50 MG PO TABS
100.0000 mg | ORAL_TABLET | Freq: Every day | ORAL | Status: DC
  Administered 2016-11-30: 100 mg via ORAL
  Filled 2016-11-30 (×2): qty 2

## 2016-11-30 MED ORDER — HEMOSTATIC AGENTS (NO CHARGE) OPTIME
TOPICAL | Status: DC | PRN
  Administered 2016-11-30: 1 via TOPICAL

## 2016-11-30 MED ORDER — SUGAMMADEX SODIUM 200 MG/2ML IV SOLN
INTRAVENOUS | Status: DC | PRN
  Administered 2016-11-30: 200 mg via INTRAVENOUS

## 2016-11-30 MED ORDER — HYDROCHLOROTHIAZIDE 12.5 MG PO CAPS
12.5000 mg | ORAL_CAPSULE | Freq: Every day | ORAL | Status: DC
  Administered 2016-11-30: 12.5 mg via ORAL
  Filled 2016-11-30 (×2): qty 1

## 2016-11-30 MED ORDER — FENTANYL CITRATE (PF) 250 MCG/5ML IJ SOLN
INTRAMUSCULAR | Status: AC
  Filled 2016-11-30: qty 5

## 2016-11-30 MED ORDER — ZOLPIDEM TARTRATE 5 MG PO TABS: 5.0000 mg | ORAL_TABLET | Freq: Every evening | ORAL | Status: DC | PRN

## 2016-11-30 MED ORDER — CALCIUM CARBONATE ANTACID 500 MG PO CHEW
1.0000 | CHEWABLE_TABLET | ORAL | Status: DC | PRN
  Filled 2016-11-30: qty 1

## 2016-11-30 MED ORDER — ONDANSETRON HCL 4 MG/2ML IJ SOLN
INTRAMUSCULAR | Status: AC
  Filled 2016-11-30: qty 2

## 2016-11-30 MED ORDER — ACETAMINOPHEN 325 MG PO TABS: 650.0000 mg | ORAL_TABLET | ORAL | Status: DC | PRN

## 2016-11-30 MED ORDER — CHLORHEXIDINE GLUCONATE CLOTH 2 % EX PADS: 6.0000 | MEDICATED_PAD | Freq: Once | CUTANEOUS | Status: DC

## 2016-11-30 MED ORDER — GABAPENTIN 100 MG PO CAPS: 100.0000 mg | ORAL_CAPSULE | Freq: Every evening | ORAL | Status: DC | PRN

## 2016-11-30 MED ORDER — LACTATED RINGERS IV SOLN
INTRAVENOUS | Status: DC
  Administered 2016-11-30 (×2): via INTRAVENOUS

## 2016-11-30 MED ORDER — PHENYLEPHRINE HCL 10 MG/ML IJ SOLN
INTRAMUSCULAR | Status: AC
  Filled 2016-11-30: qty 1

## 2016-11-30 MED ORDER — CEFAZOLIN SODIUM-DEXTROSE 2-4 GM/100ML-% IV SOLN
INTRAVENOUS | Status: AC
  Filled 2016-11-30: qty 100

## 2016-11-30 MED ORDER — LIDOCAINE 2% (20 MG/ML) 5 ML SYRINGE
INTRAMUSCULAR | Status: AC
  Filled 2016-11-30: qty 5

## 2016-11-30 MED ORDER — PHENYLEPHRINE 40 MCG/ML (10ML) SYRINGE FOR IV PUSH (FOR BLOOD PRESSURE SUPPORT)
PREFILLED_SYRINGE | INTRAVENOUS | Status: DC | PRN
  Administered 2016-11-30 (×2): 80 ug via INTRAVENOUS
  Administered 2016-11-30: 40 ug via INTRAVENOUS
  Administered 2016-11-30: 80 ug via INTRAVENOUS
  Administered 2016-11-30: 40 ug via INTRAVENOUS
  Administered 2016-11-30: 80 ug via INTRAVENOUS

## 2016-11-30 MED ORDER — EPHEDRINE SULFATE-NACL 50-0.9 MG/10ML-% IV SOSY
PREFILLED_SYRINGE | INTRAVENOUS | Status: DC | PRN
  Administered 2016-11-30: 10 mg via INTRAVENOUS

## 2016-11-30 MED ORDER — DIAZEPAM 5 MG PO TABS
5.0000 mg | ORAL_TABLET | Freq: Four times a day (QID) | ORAL | Status: DC | PRN
  Administered 2016-11-30 – 2016-12-01 (×3): 5 mg via ORAL
  Filled 2016-11-30 (×3): qty 1

## 2016-11-30 MED ORDER — LOSARTAN POTASSIUM-HCTZ 100-12.5 MG PO TABS: 1.0000 | ORAL_TABLET | Freq: Every evening | ORAL | Status: DC

## 2016-11-30 MED ORDER — LIDOCAINE-EPINEPHRINE 0.5 %-1:200000 IJ SOLN
INTRAMUSCULAR | Status: DC | PRN
  Administered 2016-11-30: 10 mL

## 2016-11-30 MED ORDER — MENTHOL 3 MG MT LOZG: 1.0000 | LOZENGE | OROMUCOSAL | Status: DC | PRN

## 2016-11-30 MED ORDER — HYDROMORPHONE HCL 1 MG/ML IJ SOLN
0.2500 mg | INTRAMUSCULAR | Status: DC | PRN
  Administered 2016-11-30: 0.5 mg via INTRAVENOUS

## 2016-11-30 MED ORDER — PHENYLEPHRINE HCL 10 MG/ML IJ SOLN
INTRAVENOUS | Status: DC | PRN
  Administered 2016-11-30: 40 ug/min via INTRAVENOUS

## 2016-11-30 MED ORDER — LIDOCAINE 2% (20 MG/ML) 5 ML SYRINGE
INTRAMUSCULAR | Status: DC | PRN
  Administered 2016-11-30: 60 mg via INTRAVENOUS

## 2016-11-30 MED ORDER — THROMBIN 5000 UNITS EX SOLR
CUTANEOUS | Status: AC
  Filled 2016-11-30: qty 10000

## 2016-11-30 MED ORDER — PROPOFOL 10 MG/ML IV BOLUS
INTRAVENOUS | Status: DC | PRN
  Administered 2016-11-30: 50 mg via INTRAVENOUS
  Administered 2016-11-30: 150 mg via INTRAVENOUS

## 2016-11-30 MED ORDER — ONDANSETRON HCL 4 MG/2ML IJ SOLN
INTRAMUSCULAR | Status: DC | PRN
  Administered 2016-11-30: 4 mg via INTRAVENOUS

## 2016-11-30 MED ORDER — DEXAMETHASONE SODIUM PHOSPHATE 10 MG/ML IJ SOLN
INTRAMUSCULAR | Status: DC | PRN
  Administered 2016-11-30: 10 mg via INTRAVENOUS

## 2016-11-30 MED ORDER — 0.9 % SODIUM CHLORIDE (POUR BTL) OPTIME
TOPICAL | Status: DC | PRN
  Administered 2016-11-30: 1000 mL

## 2016-11-30 MED ORDER — VITAMIN D 1000 UNITS PO TABS
2000.0000 [IU] | ORAL_TABLET | Freq: Every evening | ORAL | Status: DC
  Administered 2016-11-30: 2000 [IU] via ORAL
  Filled 2016-11-30: qty 2

## 2016-11-30 MED ORDER — SODIUM CHLORIDE 0.9 % IV SOLN: 250.0000 mL | INTRAVENOUS | Status: DC

## 2016-11-30 MED ORDER — OXYCODONE HCL 5 MG PO TABS
5.0000 mg | ORAL_TABLET | ORAL | Status: DC | PRN
  Administered 2016-11-30 – 2016-12-01 (×4): 10 mg via ORAL
  Filled 2016-11-30 (×4): qty 2

## 2016-11-30 MED ORDER — SUGAMMADEX SODIUM 200 MG/2ML IV SOLN
INTRAVENOUS | Status: AC
  Filled 2016-11-30: qty 2

## 2016-11-30 MED ORDER — THROMBIN 5000 UNITS EX SOLR
CUTANEOUS | Status: DC | PRN
  Administered 2016-11-30 (×2): 5000 [IU] via TOPICAL

## 2016-11-30 MED ORDER — MIDAZOLAM HCL 2 MG/2ML IJ SOLN
INTRAMUSCULAR | Status: DC | PRN
  Administered 2016-11-30: 2 mg via INTRAVENOUS

## 2016-11-30 MED ORDER — EPHEDRINE 5 MG/ML INJ
INTRAVENOUS | Status: AC
  Filled 2016-11-30: qty 10

## 2016-11-30 MED ORDER — PHENOL 1.4 % MT LIQD: 1.0000 | OROMUCOSAL | Status: DC | PRN

## 2016-11-30 MED ORDER — HYDROMORPHONE HCL 1 MG/ML IJ SOLN
INTRAMUSCULAR | Status: AC
Start: 2016-11-30 — End: 2016-12-01
  Filled 2016-11-30: qty 1

## 2016-11-30 MED ORDER — ONDANSETRON HCL 4 MG PO TABS: 4.0000 mg | ORAL_TABLET | Freq: Four times a day (QID) | ORAL | Status: DC | PRN

## 2016-11-30 MED ORDER — ALBUTEROL SULFATE HFA 108 (90 BASE) MCG/ACT IN AERS
INHALATION_SPRAY | RESPIRATORY_TRACT | Status: AC
  Filled 2016-11-30: qty 6.7

## 2016-11-30 MED ORDER — PROMETHAZINE HCL 25 MG/ML IJ SOLN: 6.2500 mg | INTRAMUSCULAR | Status: DC | PRN

## 2016-11-30 MED ORDER — HYDROMORPHONE HCL 1 MG/ML IJ SOLN: 1.0000 mg | INTRAMUSCULAR | Status: DC | PRN

## 2016-11-30 MED ORDER — ATORVASTATIN CALCIUM 20 MG PO TABS
20.0000 mg | ORAL_TABLET | Freq: Every evening | ORAL | Status: DC
  Administered 2016-11-30: 20 mg via ORAL
  Filled 2016-11-30: qty 1

## 2016-11-30 MED ORDER — SODIUM CHLORIDE 0.9% FLUSH: 3.0000 mL | Freq: Two times a day (BID) | INTRAVENOUS | Status: DC

## 2016-11-30 MED ORDER — LIDOCAINE-EPINEPHRINE 0.5 %-1:200000 IJ SOLN
INTRAMUSCULAR | Status: AC
  Filled 2016-11-30: qty 1

## 2016-11-30 MED ORDER — FENTANYL CITRATE (PF) 250 MCG/5ML IJ SOLN
INTRAMUSCULAR | Status: DC | PRN
  Administered 2016-11-30: 50 ug via INTRAVENOUS

## 2016-11-30 MED ORDER — ARTIFICIAL TEARS OPHTHALMIC OINT
TOPICAL_OINTMENT | OPHTHALMIC | Status: AC
  Filled 2016-11-30: qty 3.5

## 2016-11-30 MED ORDER — CEFAZOLIN SODIUM-DEXTROSE 2-4 GM/100ML-% IV SOLN
2.0000 g | INTRAVENOUS | Status: AC
  Administered 2016-11-30: 2 g via INTRAVENOUS

## 2016-11-30 MED ORDER — ACETAMINOPHEN 650 MG RE SUPP: 650.0000 mg | RECTAL | Status: DC | PRN

## 2016-11-30 MED ORDER — ROCURONIUM BROMIDE 10 MG/ML (PF) SYRINGE
PREFILLED_SYRINGE | INTRAVENOUS | Status: AC
  Filled 2016-11-30: qty 5

## 2016-11-30 MED ORDER — OXYCODONE HCL ER 10 MG PO T12A
10.0000 mg | EXTENDED_RELEASE_TABLET | Freq: Two times a day (BID) | ORAL | Status: DC
  Administered 2016-11-30 – 2016-12-01 (×2): 10 mg via ORAL
  Filled 2016-11-30 (×2): qty 1

## 2016-11-30 MED ORDER — ALBUMIN HUMAN 5 % IV SOLN
INTRAVENOUS | Status: DC | PRN
  Administered 2016-11-30: 15:00:00 via INTRAVENOUS

## 2016-11-30 MED ORDER — ONDANSETRON HCL 4 MG/2ML IJ SOLN
4.0000 mg | Freq: Four times a day (QID) | INTRAMUSCULAR | Status: DC | PRN
Start: 2016-11-30 — End: 2016-12-01

## 2016-11-30 MED ORDER — ROCURONIUM BROMIDE 10 MG/ML (PF) SYRINGE
PREFILLED_SYRINGE | INTRAVENOUS | Status: DC | PRN
  Administered 2016-11-30: 60 mg via INTRAVENOUS

## 2016-11-30 SURGICAL SUPPLY — 55 items
BAG DECANTER FOR FLEXI CONT (MISCELLANEOUS) ×3 IMPLANT
BENZOIN TINCTURE PRP APPL 2/3 (GAUZE/BANDAGES/DRESSINGS) IMPLANT
BLADE CLIPPER SURG (BLADE) IMPLANT
BUR MATCHSTICK NEURO 3.0 LAGG (BURR) ×3 IMPLANT
BUR PRECISION FLUTE 5.0 (BURR) IMPLANT
CANISTER SUCT 3000ML PPV (MISCELLANEOUS) ×3 IMPLANT
CARTRIDGE OIL MAESTRO DRILL (MISCELLANEOUS) ×1 IMPLANT
CLOSURE WOUND 1/2 X4 (GAUZE/BANDAGES/DRESSINGS)
DECANTER SPIKE VIAL GLASS SM (MISCELLANEOUS) ×3 IMPLANT
DERMABOND ADVANCED (GAUZE/BANDAGES/DRESSINGS) ×2
DERMABOND ADVANCED .7 DNX12 (GAUZE/BANDAGES/DRESSINGS) ×1 IMPLANT
DIFFUSER DRILL AIR PNEUMATIC (MISCELLANEOUS) ×3 IMPLANT
DRAPE LAPAROTOMY 100X72X124 (DRAPES) ×3 IMPLANT
DRAPE MICROSCOPE LEICA (MISCELLANEOUS) ×3 IMPLANT
DRAPE POUCH INSTRU U-SHP 10X18 (DRAPES) ×3 IMPLANT
DRAPE SURG 17X23 STRL (DRAPES) ×3 IMPLANT
DURAPREP 26ML APPLICATOR (WOUND CARE) ×3 IMPLANT
ELECT REM PT RETURN 9FT ADLT (ELECTROSURGICAL) ×3
ELECTRODE REM PT RTRN 9FT ADLT (ELECTROSURGICAL) ×1 IMPLANT
GAUZE SPONGE 4X4 12PLY STRL (GAUZE/BANDAGES/DRESSINGS) IMPLANT
GAUZE SPONGE 4X4 16PLY XRAY LF (GAUZE/BANDAGES/DRESSINGS) IMPLANT
GLOVE BIOGEL PI IND STRL 7.0 (GLOVE) ×2 IMPLANT
GLOVE BIOGEL PI IND STRL 7.5 (GLOVE) ×2 IMPLANT
GLOVE BIOGEL PI INDICATOR 7.0 (GLOVE) ×4
GLOVE BIOGEL PI INDICATOR 7.5 (GLOVE) ×4
GLOVE ECLIPSE 6.5 STRL STRAW (GLOVE) ×3 IMPLANT
GLOVE EXAM NITRILE LRG STRL (GLOVE) IMPLANT
GLOVE EXAM NITRILE XL STR (GLOVE) IMPLANT
GLOVE EXAM NITRILE XS STR PU (GLOVE) IMPLANT
GLOVE SS N UNI LF 6.5 STRL (GLOVE) ×6 IMPLANT
GLOVE SS N UNI LF 7.0 STRL (GLOVE) ×6 IMPLANT
GOWN STRL REUS W/ TWL LRG LVL3 (GOWN DISPOSABLE) ×2 IMPLANT
GOWN STRL REUS W/ TWL XL LVL3 (GOWN DISPOSABLE) IMPLANT
GOWN STRL REUS W/TWL 2XL LVL3 (GOWN DISPOSABLE) IMPLANT
GOWN STRL REUS W/TWL LRG LVL3 (GOWN DISPOSABLE) ×4
GOWN STRL REUS W/TWL XL LVL3 (GOWN DISPOSABLE)
KIT BASIN OR (CUSTOM PROCEDURE TRAY) ×3 IMPLANT
KIT ROOM TURNOVER OR (KITS) ×3 IMPLANT
NEEDLE HYPO 25X1 1.5 SAFETY (NEEDLE) ×3 IMPLANT
NEEDLE SPNL 18GX3.5 QUINCKE PK (NEEDLE) IMPLANT
NS IRRIG 1000ML POUR BTL (IV SOLUTION) ×3 IMPLANT
OIL CARTRIDGE MAESTRO DRILL (MISCELLANEOUS) ×3
PACK LAMINECTOMY NEURO (CUSTOM PROCEDURE TRAY) ×3 IMPLANT
PAD ARMBOARD 7.5X6 YLW CONV (MISCELLANEOUS) ×9 IMPLANT
RUBBERBAND STERILE (MISCELLANEOUS) ×6 IMPLANT
SPONGE LAP 4X18 X RAY DECT (DISPOSABLE) IMPLANT
SPONGE SURGIFOAM ABS GEL SZ50 (HEMOSTASIS) ×3 IMPLANT
STRIP CLOSURE SKIN 1/2X4 (GAUZE/BANDAGES/DRESSINGS) IMPLANT
SUT VIC AB 0 CT1 18XCR BRD8 (SUTURE) ×1 IMPLANT
SUT VIC AB 0 CT1 8-18 (SUTURE) ×2
SUT VIC AB 2-0 CT1 18 (SUTURE) ×3 IMPLANT
SUT VIC AB 3-0 SH 8-18 (SUTURE) ×3 IMPLANT
TOWEL GREEN STERILE (TOWEL DISPOSABLE) ×3 IMPLANT
TOWEL GREEN STERILE FF (TOWEL DISPOSABLE) ×3 IMPLANT
WATER STERILE IRR 1000ML POUR (IV SOLUTION) ×3 IMPLANT

## 2016-11-30 NOTE — Transfer of Care (Signed)
Immediate Anesthesia Transfer of Care Note  Patient: Colton Hart  Procedure(s) Performed: Procedure(s) with comments: LEFT LUMBAR FOUR-FIVE LAMINECTOMY FOR FACET/SYNOVIAL CYST (Left) - LAMINECTOMY FOR FACET/SYNOVIAL CYST LUMBAR 4-LUMBAR 5, LEFT  Patient Location: PACU  Anesthesia Type:General  Level of Consciousness: awake, alert  and oriented  Airway & Oxygen Therapy: Patient Spontanous Breathing and Patient connected to nasal cannula oxygen  Post-op Assessment: Report given to RN and Post -op Vital signs reviewed and stable  Post vital signs: Reviewed and stable  Last Vitals:  Vitals:   11/30/16 1030 11/30/16 1634  BP: 138/76 (!) 124/58  Pulse: 78 92  Resp: 18 18  Temp: 36.7 C (!) 36.2 C  SpO2: 93% 96%    Last Pain: There were no vitals filed for this visit.       Complications: No apparent anesthesia complications

## 2016-11-30 NOTE — Anesthesia Procedure Notes (Signed)
Procedure Name: Intubation Date/Time: 11/30/2016 2:42 PM Performed by: Candis Shine Pre-anesthesia Checklist: Patient identified, Emergency Drugs available, Suction available and Patient being monitored Patient Re-evaluated:Patient Re-evaluated prior to induction Oxygen Delivery Method: Circle System Utilized Preoxygenation: Pre-oxygenation with 100% oxygen Induction Type: IV induction Ventilation: Mask ventilation without difficulty and Oral airway inserted - appropriate to patient size Laryngoscope Size: Mac and 3 Grade View: Grade I Tube type: Oral Tube size: 7.5 mm Number of attempts: 1 Airway Equipment and Method: Stylet and Oral airway Placement Confirmation: ETT inserted through vocal cords under direct vision,  positive ETCO2 and breath sounds checked- equal and bilateral Secured at: 22 cm Tube secured with: Tape Dental Injury: Teeth and Oropharynx as per pre-operative assessment

## 2016-11-30 NOTE — Anesthesia Preprocedure Evaluation (Signed)
Anesthesia Evaluation  Patient identified by MRN, date of birth, ID band Patient awake    Reviewed: Allergy & Precautions, NPO status , Patient's Chart, lab work & pertinent test results  Airway Mallampati: II  TM Distance: >3 FB Neck ROM: Full    Dental no notable dental hx.    Pulmonary neg pulmonary ROS, former smoker,    Pulmonary exam normal breath sounds clear to auscultation       Cardiovascular hypertension, Normal cardiovascular exam Rhythm:Regular Rate:Normal     Neuro/Psych negative neurological ROS  negative psych ROS   GI/Hepatic negative GI ROS, Neg liver ROS,   Endo/Other  negative endocrine ROS  Renal/GU negative Renal ROS  negative genitourinary   Musculoskeletal negative musculoskeletal ROS (+)   Abdominal   Peds negative pediatric ROS (+)  Hematology negative hematology ROS (+)   Anesthesia Other Findings   Reproductive/Obstetrics negative OB ROS                             Anesthesia Physical Anesthesia Plan  ASA: II  Anesthesia Plan: General   Post-op Pain Management:    Induction: Intravenous  PONV Risk Score and Plan: 2 and Ondansetron and Dexamethasone  Airway Management Planned: Oral ETT  Additional Equipment:   Intra-op Plan:   Post-operative Plan: Extubation in OR  Informed Consent: I have reviewed the patients History and Physical, chart, labs and discussed the procedure including the risks, benefits and alternatives for the proposed anesthesia with the patient or authorized representative who has indicated his/her understanding and acceptance.   Dental advisory given  Plan Discussed with: CRNA and Surgeon  Anesthesia Plan Comments:         Anesthesia Quick Evaluation

## 2016-11-30 NOTE — Anesthesia Postprocedure Evaluation (Signed)
Anesthesia Post Note  Patient: FITZHUGH VIZCARRONDO  Procedure(s) Performed: Procedure(s) (LRB): LEFT LUMBAR FOUR-FIVE LAMINECTOMY FOR FACET/SYNOVIAL CYST (Left)     Patient location during evaluation: PACU Anesthesia Type: General Level of consciousness: awake and alert Pain management: pain level controlled Vital Signs Assessment: post-procedure vital signs reviewed and stable Respiratory status: spontaneous breathing, nonlabored ventilation, respiratory function stable and patient connected to nasal cannula oxygen Cardiovascular status: blood pressure returned to baseline and stable Postop Assessment: no signs of nausea or vomiting Anesthetic complications: no    Last Vitals:  Vitals:   11/30/16 1815 11/30/16 1830  BP: 122/72 122/72  Pulse: 80 83  Resp: 13 13  Temp:  (!) 36 C  SpO2: 91% 93%                 Effie Berkshire

## 2016-11-30 NOTE — Op Note (Signed)
11/30/2016  4:37 PM  PATIENT:  [name]  71 y.o. 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 was taken to the operating room, intubated and placed under a general anesthetic without difficulty. He was positioned prone on a Wilson frame with all pressure points padded. His back was prepped and draped in a sterile manner. I opened the skin with a 10 blade and carried the dissection down to the thoracolumbar fascia. I used both sharp dissection and the monopolar cautery to expose the lamina of L4, and L5. I confirmed my location with an intraoperative xray.  I used the drill, Kerrison punches, and curettes to perform a semihemilaminectomy of L4. I used the punches to remove the ligamentum flavum to expose the thecal sac. I brought the microscope into the operative field. I started my  decompression of the spinal canal, thecal sac and L4, and L5 root(s). I cauterized epidural veins overlying the disc space then divided them sharply. I opened the disc space with a 15 blade and proceeded with the discectomy. I used pituitary rongeurs, curettes, and other instruments to remove disc material. After the discectomy was completed I inspected the L4, and L5 nerve root and felt it was well decompressed. I explored rostrally, laterally, medially, and caudally and was satisfied with the decompression. I irrigated the wound, then closed in layers. I approximated the thoracolumbar fascia, subcutaneous, and subcuticular planes with vicryl  sutures. I used dermabond for a sterile dressing.   PLAN OF CARE: Admit for overnight observation  PATIENT DISPOSITION:  PACU - hemodynamically stable.   Delay start of Pharmacological VTE agent (>24hrs) due to surgical blood loss or risk of bleeding:  yes

## 2016-11-30 NOTE — H&P (Signed)
BP 138/76   Pulse 78   Temp 98.1 F (36.7 C)   Resp 18   Wt 108.4 kg (239 lb)   SpO2 93%   BMI 33.33 kg/m  Mr. Sheddrick Lattanzio is a 71 year old gentleman who presents today for evaluation of pain that he has had in his back and left lower extremity. He has had this pain since 1991, but the specific pain that we speak of has gotten much worse recently. He had a knee replacement in 2014. No other surgeries. He has an allergy to aspirin. He takes Losartan, Atorvastatin, Gabapentin, Tylenol, and Naprosyn. He says the Gabapentin helped the back pain significantly, and he takes ibuprofen to supplement it at night. He says he is in enough pain that he would get an operation, and while he is not hurting now, this is something that he has noticed. He went on a vacation and wound up in the hotel because he simply could not walk secondary to the pain. Mother and Father both had Alzheimer's and both are deceased. REVIEW OF SYSTEMS: Positive for tinnitus balance problems sinus problem, mouth sores, joint pain, back pain, arthritis. He says the pain has gotten worse over the last 3 years, numbness and tingling in the left thigh. PHYSICAL EXAMINATION: Vital Signs: Height 5 feet 11.5 inches, weight 237 pounds. Temperature is 98.1, blood pressure is 129/76, pulse is 84. Right now, he is in no pain. Neurological: He is alert, oriented by 4, answers all questions appropriately. Memory, language, attention span, and fund of knowledge normal. Speech is clear and fluent. Normal muscle tone, bulk, coordination. He has a negative Romberg test. He can toe walk, heel walk without difficulty. Gait is normal. He has no reflex that I was able to elicit at either knee or ankle, 2+ at the biceps, triceps, brachioradialis. Proprioception is intact in the upper and lower extremities. No clonus. No Hoffmann sign.  I did review the MRI from January of 2018. It shows a synovial cyst and disc and hypertrophy of the ligamentum flavum, causing  severe stenosis on the left side at L4-5, and this is in the neural foramina. Mr. Borges returns today with a full account of the MRI. There was nothing at L3-4 that was of any significance. The main problem still remains at L4-5 where I think it is a synovial cyst. The radiologist thinks that it may be disc, but the left side of 4-5 is a problem. There is no pain on the right. We will not be addressing anything on the right side, just 4-5 for synovial cyst resection.  He is 5 feet 11 inches, weighs 237 pounds. Temperature is 98, blood pressure 116/70, pulse is 80. Pain is 3 out of 10. He says the pain waxes and wanes. He has good days, like today, and he has some very bad days. He spoke about injections but he rejected that. States that he just wants to take care of this.

## 2016-12-01 ENCOUNTER — Encounter (HOSPITAL_COMMUNITY): Payer: Self-pay | Admitting: Neurosurgery

## 2016-12-01 DIAGNOSIS — M5126 Other intervertebral disc displacement, lumbar region: Secondary | ICD-10-CM | POA: Diagnosis not present

## 2016-12-01 DIAGNOSIS — Z886 Allergy status to analgesic agent status: Secondary | ICD-10-CM | POA: Diagnosis not present

## 2016-12-01 DIAGNOSIS — I10 Essential (primary) hypertension: Secondary | ICD-10-CM | POA: Diagnosis not present

## 2016-12-01 DIAGNOSIS — Z87891 Personal history of nicotine dependence: Secondary | ICD-10-CM | POA: Diagnosis not present

## 2016-12-01 MED ORDER — OXYCODONE-ACETAMINOPHEN 5-325 MG PO TABS: 1.0000 | ORAL_TABLET | ORAL | 0 refills | Status: DC | PRN

## 2016-12-01 MED ORDER — CYCLOBENZAPRINE HCL 10 MG PO TABS: 10.0000 mg | ORAL_TABLET | Freq: Three times a day (TID) | ORAL | 2 refills | Status: DC | PRN

## 2016-12-01 NOTE — Progress Notes (Signed)
Patient is discharged from room 3C02 at this time. Alert and in stable condition. IV site d/c'd and instructions read to patient and wife with understanding verbalized. Left unit via wheelchair with all belongings at side.

## 2016-12-01 NOTE — Discharge Summary (Signed)
Physician Discharge Summary  Patient ID: Colton Hart MRN: [id number] DOB/AGE: Feb 18, 1946 71 y.o.  Admit date: 11/30/2016 Discharge date: 12/01/2016  Admission Diagnoses:  HNP, lumbar  Discharge Diagnoses:  Same Active Problems:   HNP (herniated nucleus pulposus), lumbar  Discharged Condition: Stable  Hospital Course:  Colton Hart is a 71 y.o. male who was admitted for the below procedure. There were no post operative complications. At time of discharge, pain was well controlled, ambulating, tolerating po, voiding normal. Ready for discharge.  Treatments: Surgery LEFT LUMBAR FOUR-FIVE semihemilaminectomy and discetomy Micro dissection  Discharge Exam: Blood pressure (!) 103/56, pulse 71, temperature 98.8 F (37.1 C), temperature source Oral, resp. rate 16, height 5\' 11"  (1.803 m), weight 108.4 kg (239 lb), SpO2 95 %. Awake, alert, oriented Speech fluent, appropriate CN grossly intact 5/5 BUE/BLE Wound c/d/i  Disposition: Home  Discharge Instructions    Call MD for:  difficulty breathing, headache or visual disturbances    Complete by:  As directed    Call MD for:  persistant dizziness or light-headedness    Complete by:  As directed    Call MD for:  redness, tenderness, or signs of infection (pain, swelling, redness, odor or green/yellow discharge around incision site)    Complete by:  As directed    Call MD for:  severe uncontrolled pain    Complete by:  As directed    Call MD for:  temperature >100.4    Complete by:  As directed    Diet general    Complete by:  As directed    Driving Restrictions    Complete by:  As directed    Do not drive until given clearance.   Increase activity slowly    Complete by:  As directed    Lifting restrictions    Complete by:  As directed    Do not lift anything >10lbs. Avoid bending and twisting in awkward positions. Avoid bending at the back.   May shower / Bathe    Complete by:  As directed    In 24 hours. Okay to wash  wound with warm soapy water. Avoid scrubbing the wound. Pat dry.   Remove dressing in 24 hours    Complete by:  As directed      Allergies as of 12/01/2016      Reactions   Asa [aspirin] Other (See Comments)   Seizures       Medication List    TAKE these medications   acetaminophen 500 MG tablet Commonly known as:  TYLENOL Take 500 mg by mouth at bedtime as needed for moderate pain.   ALEVE 220 MG tablet Generic drug:  naproxen sodium Take 220 mg by mouth daily as needed (pain).   atorvastatin 20 MG tablet Commonly known as:  LIPITOR Take 1 tablet (20 mg total) by mouth daily. What changed:  when to take this   calcium carbonate 500 MG chewable tablet Commonly known as:  TUMS - dosed in mg elemental calcium Chew 1 tablet by mouth as needed for indigestion or heartburn.   cyclobenzaprine 10 MG tablet Commonly known as:  FLEXERIL Take 1 tablet (10 mg total) by mouth 3 (three) times daily as needed for muscle spasms.   gabapentin 100 MG capsule Commonly known as:  NEURONTIN Take 1 capsule (100 mg total) by mouth at bedtime. What changed:  when to take this  reasons to take this   losartan-hydrochlorothiazide 100-12.5 MG tablet Commonly known as:  HYZAAR Take 1  tablet by mouth daily. What changed:  when to take this   oxyCODONE-acetaminophen 5-325 MG tablet Commonly known as:  ROXICET Take 1 tablet by mouth every 4 (four) hours as needed for severe pain.   Vitamin D3 2000 units Tabs Take 2,000 Units by mouth every evening.            Discharge Care Instructions        Start     Ordered   12/01/16 0000  Increase activity slowly     12/01/16 1544   12/01/16 0000  May shower / Bathe    Comments:  In 24 hours. Okay to wash wound with warm soapy water. Avoid scrubbing the wound. Pat dry.   12/01/16 1544   12/01/16 0000  Driving Restrictions    Comments:  Do not drive until given clearance.   12/01/16 1544   12/01/16 0000  Lifting restrictions     Comments:  Do not lift anything >10lbs. Avoid bending and twisting in awkward positions. Avoid bending at the back.   12/01/16 1544   12/01/16 0000  Diet general     12/01/16 1544   12/01/16 0000  Remove dressing in 24 hours     12/01/16 1544   12/01/16 0000  Call MD for:  temperature >100.4     12/01/16 1544   12/01/16 0000  Call MD for:  severe uncontrolled pain     12/01/16 1544   12/01/16 0000  Call MD for:  redness, tenderness, or signs of infection (pain, swelling, redness, odor or green/yellow discharge around incision site)     12/01/16 1544   12/01/16 0000  Call MD for:  difficulty breathing, headache or visual disturbances     12/01/16 1544   12/01/16 0000  Call MD for:  persistant dizziness or light-headedness     12/01/16 1544   12/01/16 0000  cyclobenzaprine (FLEXERIL) 10 MG tablet  3 times daily PRN     12/01/16 1544   12/01/16 0000  oxyCODONE-acetaminophen (ROXICET) 5-325 MG tablet  Every 4 hours PRN     12/01/16 1544     Follow-up Information    Ashok Pall, MD Follow up.   Specialty:  Neurosurgery Contact information: [address] [place] [address] [place] [address] [phone]           Signed: Traci Sermon 12/01/2016, 3:44 PM

## 2017-01-09 ENCOUNTER — Non-Acute Institutional Stay: Payer: Medicare Other

## 2017-01-09 VITALS — BP 138/76 | HR 87 | Temp 97.7°F | Ht 71.0 in | Wt 240.0 lb

## 2017-01-09 DIAGNOSIS — Z0000 Encounter for general adult medical examination without abnormal findings: Secondary | ICD-10-CM | POA: Diagnosis not present

## 2017-01-09 NOTE — Patient Instructions (Signed)
Colton Hart , Thank you for taking time to come for your Medicare Wellness Visit. I appreciate your ongoing commitment to your health goals. Please review the following plan we discussed and let me know if I can assist you in the future.   Screening recommendations/referrals: Colonoscopy up to date. Due 01/31/2024 Recommended yearly ophthalmology/optometry visit for glaucoma screening and checkup Recommended yearly dental visit for hygiene and checkup  Vaccinations: Influenza vaccine due, please receive when [facility] gets it in stock Pneumococcal vaccine up to date Tdap vaccine up to date. Due 04/04/2023 Shingles vaccine due, we will send prescription in January as requested    Advanced directives: In Chart  Conditions/risks identified: None  Next appointment: Dr. Mariea Clonts 01/10/2017 @ 9:30am  Preventive Care 65 Years and Older, Male Preventive care refers to lifestyle choices and visits with your health care provider that can promote health and wellness. What does preventive care include?  A yearly physical exam. This is also called an annual well check.  Dental exams once or twice a year.  Routine eye exams. Ask your health care provider how often you should have your eyes checked.  Personal lifestyle choices, including:  Daily care of your teeth and gums.  Regular physical activity.  Eating a healthy diet.  Avoiding tobacco and drug use.  Limiting alcohol use.  Practicing safe sex.  Taking low doses of aspirin every day.  Taking vitamin and mineral supplements as recommended by your health care provider. What happens during an annual well check? The services and screenings done by your health care provider during your annual well check will depend on your age, overall health, lifestyle risk factors, and family history of disease. Counseling  Your health care provider may ask you questions about your:  Alcohol use.  Tobacco use.  Drug use.  Emotional  well-being.  Home and relationship well-being.  Sexual activity.  Eating habits.  History of falls.  Memory and ability to understand (cognition).  Work and work Statistician. Screening  You may have the following tests or measurements:  Height, weight, and BMI.  Blood pressure.  Lipid and cholesterol levels. These may be checked every 5 years, or more frequently if you are over 41 years old.  Skin check.  Lung cancer screening. You may have this screening every year starting at age 32 if you have a 30-pack-year history of smoking and currently smoke or have quit within the past 15 years.  Fecal occult blood test (FOBT) of the stool. You may have this test every year starting at age 79.  Flexible sigmoidoscopy or colonoscopy. You may have a sigmoidoscopy every 5 years or a colonoscopy every 10 years starting at age 55.  Prostate cancer screening. Recommendations will vary depending on your family history and other risks.  Hepatitis C blood test.  Hepatitis B blood test.  Sexually transmitted disease (STD) testing.  Diabetes screening. This is done by checking your blood sugar (glucose) after you have not eaten for a while (fasting). You may have this done every 1-3 years.  Abdominal aortic aneurysm (AAA) screening. You may need this if you are a current or former smoker.  Osteoporosis. You may be screened starting at age 76 if you are at high risk. Talk with your health care provider about your test results, treatment options, and if necessary, the need for more tests. Vaccines  Your health care provider may recommend certain vaccines, such as:  Influenza vaccine. This is recommended every year.  Tetanus, diphtheria, and acellular  pertussis (Tdap, Td) vaccine. You may need a Td booster every 10 years.  Zoster vaccine. You may need this after age 19.  Pneumococcal 13-valent conjugate (PCV13) vaccine. One dose is recommended after age 42.  Pneumococcal  polysaccharide (PPSV23) vaccine. One dose is recommended after age 45. Talk to your health care provider about which screenings and vaccines you need and how often you need them. This information is not intended to replace advice given to you by your health care provider. Make sure you discuss any questions you have with your health care provider. Document Released: 04/16/2015 Document Revised: 12/08/2015 Document Reviewed: 01/19/2015 Elsevier Interactive Patient Education  2017 [place] Prevention in the Home Falls can cause injuries. They can happen to people of all ages. There are many things you can do to make your home safe and to help prevent falls. What can I do on the outside of my home?  Regularly fix the edges of walkways and driveways and fix any cracks.  Remove anything that might make you trip as you walk through a door, such as a raised step or threshold.  Trim any bushes or trees on the path to your home.  Use bright outdoor lighting.  Clear any walking paths of anything that might make someone trip, such as rocks or tools.  Regularly check to see if handrails are loose or broken. Make sure that both sides of any steps have handrails.  Any raised decks and porches should have guardrails on the edges.  Have any leaves, snow, or ice cleared regularly.  Use sand or salt on walking paths during winter.  Clean up any spills in your garage right away. This includes oil or grease spills. What can I do in the bathroom?  Use night lights.  Install grab bars by the toilet and in the tub and shower. Do not use towel bars as grab bars.  Use non-skid mats or decals in the tub or shower.  If you need to sit down in the shower, use a plastic, non-slip stool.  Keep the floor dry. Clean up any water that spills on the floor as soon as it happens.  Remove soap buildup in the tub or shower regularly.  Attach bath mats securely with double-sided non-slip rug  tape.  Do not have throw rugs and other things on the floor that can make you trip. What can I do in the bedroom?  Use night lights.  Make sure that you have a light by your bed that is easy to reach.  Do not use any sheets or blankets that are too big for your bed. They should not hang down onto the floor.  Have a firm chair that has side arms. You can use this for support while you get dressed.  Do not have throw rugs and other things on the floor that can make you trip. What can I do in the kitchen?  Clean up any spills right away.  Avoid walking on wet floors.  Keep items that you use a lot in easy-to-reach places.  If you need to reach something above you, use a strong step stool that has a grab bar.  Keep electrical cords out of the way.  Do not use floor polish or wax that makes floors slippery. If you must use wax, use non-skid floor wax.  Do not have throw rugs and other things on the floor that can make you trip. What can I do with my stairs?  Do not leave any items on the stairs.  Make sure that there are handrails on both sides of the stairs and use them. Fix handrails that are broken or loose. Make sure that handrails are as long as the stairways.  Check any carpeting to make sure that it is firmly attached to the stairs. Fix any carpet that is loose or worn.  Avoid having throw rugs at the top or bottom of the stairs. If you do have throw rugs, attach them to the floor with carpet tape.  Make sure that you have a light switch at the top of the stairs and the bottom of the stairs. If you do not have them, ask someone to add them for you. What else can I do to help prevent falls?  Wear shoes that:  Do not have high heels.  Have rubber bottoms.  Are comfortable and fit you well.  Are closed at the toe. Do not wear sandals.  If you use a stepladder:  Make sure that it is fully opened. Do not climb a closed stepladder.  Make sure that both sides of the  stepladder are locked into place.  Ask someone to hold it for you, if possible.  Clearly mark and make sure that you can see:  Any grab bars or handrails.  First and last steps.  Where the edge of each step is.  Use tools that help you move around (mobility aids) if they are needed. These include:  Canes.  Walkers.  Scooters.  Crutches.  Turn on the lights when you go into a dark area. Replace any light bulbs as soon as they burn out.  Set up your furniture so you have a clear path. Avoid moving your furniture around.  If any of your floors are uneven, fix them.  If there are any pets around you, be aware of where they are.  Review your medicines with your doctor. Some medicines can make you feel dizzy. This can increase your chance of falling. Ask your doctor what other things that you can do to help prevent falls. This information is not intended to replace advice given to you by your health care provider. Make sure you discuss any questions you have with your health care provider. Document Released: 01/14/2009 Document Revised: 08/26/2015 Document Reviewed: 04/24/2014 Elsevier Interactive Patient Education  2017 [employer].

## 2017-01-09 NOTE — Progress Notes (Signed)
Subjective:   HOBSON LAX is a 71 y.o. male who presents for an Initial Medicare Annual Wellness Visit at [facility]   Objective:    Today's Vitals   01/09/17 1003  BP: 138/76  Pulse: 87  Temp: 97.7 F (36.5 C)  TempSrc: Oral  SpO2: 95%  Weight: 240 lb (108.9 kg)  Height: 5\' 11"  (1.803 m)  PainSc: 5    Body mass index is 33.47 kg/m.  Current Medications (verified) Outpatient Encounter Prescriptions as of 01/09/2017  Medication Sig  . acetaminophen (TYLENOL) 500 MG tablet Take 500 mg by mouth at bedtime as needed for moderate pain.   Marland Kitchen atorvastatin (LIPITOR) 20 MG tablet Take 1 tablet (20 mg total) by mouth daily. (Patient taking differently: Take 20 mg by mouth every evening. )  . calcium carbonate (TUMS - DOSED IN MG ELEMENTAL CALCIUM) 500 MG chewable tablet Chew 1 tablet by mouth as needed for indigestion or heartburn.  . Cholecalciferol (VITAMIN D3) 2000 units TABS Take 2,000 Units by mouth every evening.   . gabapentin (NEURONTIN) 100 MG capsule Take 1 capsule (100 mg total) by mouth at bedtime. (Patient taking differently: Take 100 mg by mouth at bedtime as needed (pain). )  . losartan-hydrochlorothiazide (HYZAAR) 100-12.5 MG tablet Take 1 tablet by mouth daily. (Patient taking differently: Take 1 tablet by mouth every evening. )  . naproxen sodium (ALEVE) 220 MG tablet Take 220 mg by mouth daily as needed (pain).  . [DISCONTINUED] cyclobenzaprine (FLEXERIL) 10 MG tablet Take 1 tablet (10 mg total) by mouth 3 (three) times daily as needed for muscle spasms.  . [DISCONTINUED] oxyCODONE-acetaminophen (ROXICET) 5-325 MG tablet Take 1 tablet by mouth every 4 (four) hours as needed for severe pain.   No facility-administered encounter medications on file as of 01/09/2017.     Allergies (verified) Asa [aspirin]   History: Past Medical History:  Diagnosis Date  . Allergic rhinitis   . Arthritis   . Cancer (HCC)    basal cell nose  . Carotid  atherosclerosis, bilateral    dvt  ?14  . DVT (deep venous thrombosis) ([place]) 11/22/2012  . GERD (gastroesophageal reflux disease)    occ  . High blood pressure   . Hypercholesteremia   . Hyperlipidemia   . Left atrial dilatation   . LVH (left ventricular hypertrophy)   . Obesity   . Seizures ([place])    "in 20's from aspirin"   Past Surgical History:  Procedure Laterality Date  . LUMBAR LAMINECTOMY/DECOMPRESSION MICRODISCECTOMY Left 11/30/2016   Procedure: LEFT LUMBAR FOUR-FIVE LAMINECTOMY FOR FACET/SYNOVIAL CYST;  Surgeon: Ashok Pall, MD;  Location: [place];  Service: Neurosurgery;  Laterality: Left;  LAMINECTOMY FOR FACET/SYNOVIAL CYST LUMBAR 4-LUMBAR 5, LEFT  . right leg fracture s/p surgical repair with screws Right 1966  . SKIN CANCER EXCISION  2010   from his nose, no recurrence  . TOTAL KNEE ARTHROPLASTY Right 2015   Family History  Problem Relation Age of Onset  . Alzheimer's disease Mother   . Cancer Mother        Skin  . Alzheimer's disease Father   . Cancer Father        colon, liver, skin  . Colon polyps Father   . Colon cancer Father    Social History   Occupational History  . Not on file.   Social History Main Topics  . Smoking status: Former Smoker    Packs/day: 1.00    Years: 20.00  Types: Cigarettes    Quit date: 11/27/1988  . Smokeless tobacco: Never Used  . Alcohol use No  . Drug use: No  . Sexual activity: Not on file   Tobacco Counseling Counseling given: Not Answered   Activities of Daily Living In your present state of health, do you have any difficulty performing the following activities: 01/09/2017 12/01/2016  Hearing? N N  Vision? N N  Difficulty concentrating or making decisions? N N  Walking or climbing stairs? N N  Dressing or bathing? N Y  Doing errands, shopping? N N  Preparing Food and eating ? N -  Using the Toilet? N -  In the past six months, have you accidently leaked urine? N -  Do you have problems with loss of bowel  control? N -  Managing your Medications? N -  Managing your Finances? N -  Housekeeping or managing your Housekeeping? N -  Some recent data might be hidden    Immunizations and Health Maintenance Immunization History  Administered Date(s) Administered  . Hep A / Hep B 04/03/2002  . Influenza, High Dose Seasonal PF 01/09/2014, 01/07/2015  . Influenza-Unspecified 02/18/2016  . Pneumococcal-Unspecified 09/30/2013, 01/02/2015  . Tdap 04/03/2013   Health Maintenance Due  Topic Date Due  . Hepatitis C Screening  [birth year]  . PNA vac Low Risk Adult (2 of 2 - PCV13) 01/02/2016  . INFLUENZA VACCINE  11/01/2016    Patient Care Team: Gayland Curry, DO as PCP - General (Geriatric Medicine)  Indicate any recent Medical Services you may have received from other than [facility] providers in the past year (date may be approximate).    Assessment:   This is a routine wellness examination for [place].    Hearing/Vision screen No exam data present  Dietary issues and exercise activities discussed: Current Exercise Habits: Structured exercise class, Type of exercise: Other - see comments (chair fit), Time (Minutes): 30, Frequency (Times/Week): 3, Weekly Exercise (Minutes/Week): 90, Intensity: Mild, Exercise limited by: None identified  Goals    . Maintain Lifestyle          Starting today pt will maintain lifestyle.       Depression Screen PHQ 2/9 Scores 01/09/2017 09/20/2016 06/21/2016 03/22/2016  PHQ - 2 Score 1 0 0 0    Fall Risk Fall Risk  01/09/2017 09/20/2016 06/21/2016 03/22/2016  Falls in the past year? No No No No    Cognitive Function:        Screening Tests Health Maintenance  Topic Date Due  . Hepatitis C Screening  09/09/1945  . PNA vac Low Risk Adult (2 of 2 - PCV13) 01/02/2016  . INFLUENZA VACCINE  11/01/2016  . TETANUS/TDAP  04/04/2023  . COLONOSCOPY  01/31/2024        Plan:  I have personally reviewed and addressed the Medicare Annual Wellness  questionnaire and have noted the following in the patient's chart:  A. Medical and social history B. Use of alcohol, tobacco or illicit drugs  C. Current medications and supplements D. Functional ability and status E.  Nutritional status F.  Physical activity G. Advance directives H. List of other physicians I.  Hospitalizations, surgeries, and ER visits in previous 12 months J.  [place] to include hearing, vision, cognitive, depression L. Referrals and appointments - none  In addition, I have reviewed and discussed with patient certain preventive protocols, quality metrics, and best practice recommendations. A written personalized care plan for preventive services as well as general preventive health  recommendations were provided to patient.  See attached scanned questionnaire for additional information.   Signed,   Rich Reining, RN Nurse Health Advisor   Quick Notes   Health Maintenance: Hep C screen due. Flu vaccine will be received when [facility] gets it in stock. Shingles vaccine due-pt requested to get it next year.     Abnormal Screen: MMSE 30/30. Passed clock drawing.     Patient Concerns: None     Nurse Concerns: None

## 2017-01-10 ENCOUNTER — Non-Acute Institutional Stay: Payer: Medicare Other | Admitting: Internal Medicine

## 2017-01-10 ENCOUNTER — Encounter: Payer: Self-pay | Admitting: Internal Medicine

## 2017-01-10 VITALS — BP 130/80 | HR 75 | Temp 97.9°F | Wt 241.0 lb

## 2017-01-10 DIAGNOSIS — M25512 Pain in left shoulder: Secondary | ICD-10-CM

## 2017-01-10 DIAGNOSIS — M5126 Other intervertebral disc displacement, lumbar region: Secondary | ICD-10-CM | POA: Diagnosis not present

## 2017-01-10 DIAGNOSIS — M25612 Stiffness of left shoulder, not elsewhere classified: Secondary | ICD-10-CM | POA: Diagnosis not present

## 2017-01-10 NOTE — Patient Instructions (Signed)
1 

## 2017-01-10 NOTE — Progress Notes (Signed)
Location:  Occupational psychologist of Service:  Clinic (12)  Provider: Taysia Rivere L. Mariea Clonts, D.O., C.M.D.  Code Status: DNR Goals of Care:  Advanced Directives 01/10/2017  Does Patient Have a Medical Advance Directive? Yes  Type of Advance Directive [place]  Does patient want to make changes to medical advance directive? -  Copy of [place] in Chart? Yes     Chief Complaint  Patient presents with  . Acute Visit    left shoulder pain x 2 months    HPI: Patient is a 71 y.o. male seen today for acute visit for left shoulder pain for 2 months.    He underwent left lumbar L4-5 semihemilaminectomy and discectomy with microdissection on 11/30/16 and was discharged home on 12/01/16.  He'd had a cyst noted on his imaging, also.    He may have overdone with his shoulder after the surgery. 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  He can only flex the shoulder, but not abduct it.  It's very localized anteriorly over the shoulder.  He cannot find a decent position to sleep in.  It goes down his arm.  He either has to sleep in a recliner or on sitting up which does not work long term.  It will start to ache over the day.  Tylenol helps that pain.  [facility] went to [facility].  He had a terrible night where it hurt at his elbow even in his thumb and hand.  He took a gabapentin when he got up.  30 mins later it was better, but I as b/c he was not lying on it.  Tried aleve--took two at 8pm, but no results by 11pm so took two tylenol.  He had actually stopped the gabapentin even before the surgery.  It was coming on when he stood up straight pre-surgery.  He can now stand up straight w/o pain.    He had his wellness visit with Clarise Cruz and hep c screen due.  Will get flu shot at flu shot clinic at [facility].  Wants to wait on shingrix until next year.  He has had none of the risk factors for hepatitis C.  He does not have Part D coverage  right now.   Past Medical History:  Diagnosis Date  . Allergic rhinitis   . Arthritis   . Cancer (HCC)    basal cell nose  . Carotid atherosclerosis, bilateral    dvt  ?14  . DVT (deep venous thrombosis) ([place]) 11/22/2012  . GERD (gastroesophageal reflux disease)    occ  . High blood pressure   . Hypercholesteremia   . Hyperlipidemia   . Left atrial dilatation   . LVH (left ventricular hypertrophy)   . Obesity   . Seizures ([place])    "in 20's from aspirin"    Past Surgical History:  Procedure Laterality Date  . LUMBAR LAMINECTOMY/DECOMPRESSION MICRODISCECTOMY Left 11/30/2016   Procedure: LEFT LUMBAR FOUR-FIVE LAMINECTOMY FOR FACET/SYNOVIAL CYST;  Surgeon: Ashok Pall, MD;  Location: [place];  Service: Neurosurgery;  Laterality: Left;  LAMINECTOMY FOR FACET/SYNOVIAL CYST LUMBAR 4-LUMBAR 5, LEFT  . right leg fracture s/p surgical repair with screws Right 1966  . SKIN CANCER EXCISION  2010   from his nose, no recurrence  . TOTAL KNEE ARTHROPLASTY Right 2015    Allergies  Allergen Reactions  . Asa [Aspirin] Other (See Comments)    Seizures     Outpatient  Encounter Prescriptions as of 01/10/2017  Medication Sig  . acetaminophen (TYLENOL) 500 MG tablet Take 500 mg by mouth at bedtime as needed for moderate pain.   Marland Kitchen atorvastatin (LIPITOR) 20 MG tablet Take 1 tablet (20 mg total) by mouth daily.  . calcium carbonate (TUMS - DOSED IN MG ELEMENTAL CALCIUM) 500 MG chewable tablet Chew 1 tablet by mouth as needed for indigestion or heartburn.  . Cholecalciferol (VITAMIN D3) 2000 units TABS Take 2,000 Units by mouth every evening.   . gabapentin (NEURONTIN) 100 MG capsule Take 1 capsule (100 mg total) by mouth at bedtime.  Marland Kitchen losartan-hydrochlorothiazide (HYZAAR) 100-12.5 MG tablet Take 1 tablet by mouth daily.  . naproxen sodium (ALEVE) 220 MG tablet Take 220 mg by mouth daily as needed (pain).   No facility-administered encounter medications on file as of 01/10/2017.     Review  of Systems:  Review of Systems  Constitutional: Negative for malaise/fatigue.  Respiratory: Negative for shortness of breath.   Cardiovascular: Negative for chest pain.  Musculoskeletal: Positive for joint pain. Negative for back pain, falls and neck pain.  Neurological: Positive for tingling and sensory change. Negative for focal weakness and weakness.  Psychiatric/Behavioral: Negative for memory loss.    Health Maintenance  Topic Date Due  . Hepatitis C Screening  May 28, 1945  . PNA vac Low Risk Adult (2 of 2 - PCV13) 01/02/2016  . INFLUENZA VACCINE  11/01/2016  . TETANUS/TDAP  04/04/2023  . COLONOSCOPY  01/31/2024    Physical Exam: Vitals:   01/10/17 0938  BP: 130/80  Pulse: 75  Temp: 97.9 F (36.6 C)  TempSrc: Oral  SpO2: 96%  Weight: 241 lb (109.3 kg)   Body mass index is 33.61 kg/m. Physical Exam  Constitutional: He is oriented to person, place, and time. He appears well-developed and well-nourished. No distress.  Eyes:  glasses  Musculoskeletal:  Decreased ability to actively abduct left shoulder, but is able to maintain abduction after I passively abduct his arm, normal flexion and internal rotation, tender over anterior shoulder and near [facility] joint, no current radicular symptoms (at night)  Neurological: He is alert and oriented to person, place, and time.  Skin: Skin is warm and dry.    Labs reviewed: Basic Metabolic Panel:  Recent Labs  03/24/16 09/12/16 0800 11/27/16 0826  NA 142 140 140  K 4.2 4.1 3.9  CL  --   --  107  CO2  --   --  24  GLUCOSE  --   --  108*  BUN 17 11 13   CREATININE 1.1 1.1 1.18  CALCIUM  --   --  9.4   Liver Function Tests:  Recent Labs  03/24/16 0202  AST 25  ALT 38  ALKPHOS 64   No results for input(s): LIPASE, AMYLASE in the last 8760 hours. No results for input(s): AMMONIA in the last 8760 hours. CBC:  Recent Labs  03/23/16 11/27/16 0826  WBC 6.7 8.8  HGB 16.2 15.6  HCT 48 45.3  MCV  --  95.6  PLT 256 243    Lipid Panel:  Recent Labs  09/12/16 0800  CHOL 140  HDL 35  LDLCALC 79  TRIG 127   Lab Results  Component Value Date   HGBA1C 5.6 09/12/2016    Assessment/Plan 1. Left anterior shoulder pain - suspect rotator cuff partial tear, arthropathy, some tendinopathy, may also involve biceps tendon with pain at elbow at times - MR Shoulder Left Wo Contrast; Future  2. Decreased  abduction of left shoulder joint - MR Shoulder Left Wo Contrast; Future  3. HNP (herniated nucleus pulposus), lumbar -s/p surgical repair, doing great from this perspective   Labs/tests ordered:   Orders Placed This Encounter  Procedures  . MR Shoulder Left Wo Contrast    Standing Status:   Future    Standing Expiration Date:   03/12/2018    Order Specific Question:   What is the patient's sedation requirement?    Answer:   No Sedation    Order Specific Question:   Does the patient have a pacemaker or implanted devices?    Answer:   No    Order Specific Question:   Preferred imaging location?    Answer:   [facility] (table limit-550lbs)    Order Specific Question:   Radiology Contrast Protocol - do NOT remove file path    Answer:   [email or web address]    Next appt:  03/21/2017--keep appt with labs before  [place]. Merion Grimaldo, D.O. [facility] [address], [place] [address] Cell Phone (Mon-Fri 8am-5pm):  [phone] On Call:  [phone] & follow prompts after 5pm & weekends Office Phone:  [phone] Office Fax:  [phone]

## 2017-01-24 ENCOUNTER — Other Ambulatory Visit: Payer: Self-pay | Admitting: Internal Medicine

## 2017-01-24 ENCOUNTER — Encounter: Payer: Self-pay | Admitting: Internal Medicine

## 2017-01-24 ENCOUNTER — Ambulatory Visit
Admission: RE | Admit: 2017-01-24 | Discharge: 2017-01-24 | Disposition: A | Discharge status: Home / Self Care | Payer: Medicare Other | Source: Ambulatory Visit | Attending: Internal Medicine | Admitting: Internal Medicine

## 2017-01-24 DIAGNOSIS — M25512 Pain in left shoulder: Secondary | ICD-10-CM

## 2017-01-24 DIAGNOSIS — M25412 Effusion, left shoulder: Secondary | ICD-10-CM | POA: Diagnosis not present

## 2017-01-24 DIAGNOSIS — M25511 Pain in right shoulder: Secondary | ICD-10-CM

## 2017-01-24 DIAGNOSIS — M25612 Stiffness of left shoulder, not elsewhere classified: Secondary | ICD-10-CM

## 2017-01-25 DIAGNOSIS — Z23 Encounter for immunization: Secondary | ICD-10-CM | POA: Diagnosis not present

## 2017-01-25 NOTE — Addendum Note (Signed)
Addended by: Despina Hidden on: 01/25/2017 11:28 AM   Modules accepted: Orders

## 2017-01-26 ENCOUNTER — Encounter (INDEPENDENT_AMBULATORY_CARE_PROVIDER_SITE_OTHER): Payer: Self-pay | Admitting: Orthopaedic Surgery

## 2017-01-26 ENCOUNTER — Ambulatory Visit (INDEPENDENT_AMBULATORY_CARE_PROVIDER_SITE_OTHER): Payer: Medicare Other | Admitting: Orthopaedic Surgery

## 2017-01-26 VITALS — Ht 71.0 in | Wt 241.0 lb

## 2017-01-26 DIAGNOSIS — M7542 Impingement syndrome of left shoulder: Secondary | ICD-10-CM | POA: Diagnosis not present

## 2017-01-26 DIAGNOSIS — M19012 Primary osteoarthritis, left shoulder: Secondary | ICD-10-CM

## 2017-01-26 DIAGNOSIS — M67912 Unspecified disorder of synovium and tendon, left shoulder: Secondary | ICD-10-CM | POA: Diagnosis not present

## 2017-01-26 MED ORDER — LIDOCAINE HCL 1 % IJ SOLN
3.0000 mL | INTRAMUSCULAR | Status: AC | PRN
  Administered 2017-01-26: 3 mL

## 2017-01-26 MED ORDER — METHYLPREDNISOLONE ACETATE 40 MG/ML IJ SUSP
40.0000 mg | INTRAMUSCULAR | Status: AC | PRN
  Administered 2017-01-26: 40 mg via INTRA_ARTICULAR

## 2017-01-26 MED ORDER — BUPIVACAINE HCL 0.5 % IJ SOLN
3.0000 mL | INTRAMUSCULAR | Status: AC | PRN
  Administered 2017-01-26: 3 mL via INTRA_ARTICULAR

## 2017-01-26 NOTE — Progress Notes (Signed)
Office Visit Note   Patient: Colton Hart           Date of Birth: 01/30/1946           MRN: [id number] Visit Date: 01/26/2017              Requested by: Gayland Curry, DO [place], [place] [address] PCP: Gayland Curry, DO   Assessment & Plan: Visit Diagnoses:  1. Impingement syndrome of left shoulder   2. Tendinopathy of left rotator cuff   3. Primary osteoarthritis, left shoulder     Plan: Impression is left shoulder impingement syndrome, rotator cuff tendinosis, glenohumeral arthritis.  Subacromial injection was performed today.  I would like to also get him set up with an intra-articular shoulder injection with Dr. Ernestina Patches.  Physical therapy referral was given.  Follow-up in 6 weeks.  If not better may need to consider arthroscopic debridement.  We reviewed the findings on MRI today  Follow-Up Instructions: Return in about 6 weeks (around 03/09/2017).   Orders:  No orders of the defined types were placed in this encounter.  No orders of the defined types were placed in this encounter.     Procedures: Large Joint Inj Date/Time: 01/26/2017 9:12 AM Performed by: Leandrew Koyanagi Authorized by: Leandrew Koyanagi   Consent Given by:  Patient Timeout: prior to procedure the correct patient, procedure, and site was verified   Location:  Shoulder Site:  L subacromial bursa Prep: patient was prepped and draped in usual sterile fashion   Needle Size:  22 G Approach:  Posterior Ultrasound Guidance: No   Fluoroscopic Guidance: No   Arthrogram: No   Medications:  3 mL lidocaine 1 %; 3 mL bupivacaine 0.5 %; 40 mg methylPREDNISolone acetate 40 MG/ML     Clinical Data: No additional findings.   Subjective: Chief Complaint  Patient presents with  . Left Shoulder - Pain    Patient is a very pleasant 71 year old gentleman comes in with left shoulder pain for a couple of months.  He denies any injuries or numbness and tingling.  He states that he has a constant  dull ache throughout the day with worsening pain with shoulder abduction and with lifting.  He denies any radicular symptoms.Marland Kitchen  He is not having physical therapy or injections.    Review of Systems  Constitutional: Negative.   All other systems reviewed and are negative.    Objective: Vital Signs: Ht 5\' 11"  (1.803 m)   Wt 241 lb (109.3 kg)   BMI 33.61 kg/m   Physical Exam  Constitutional: He is oriented to person, place, and time. He appears well-developed and well-nourished.  HENT:  Head: Normocephalic and atraumatic.  Eyes: Pupils are equal, round, and reactive to light.  Neck: Neck supple.  Pulmonary/Chest: Effort normal.  Abdominal: Soft.  Musculoskeletal: Normal range of motion.  Neurological: He is alert and oriented to person, place, and time.  Skin: Skin is warm.  Psychiatric: He has a normal mood and affect. His behavior is normal. Judgment and thought content normal.  Nursing note and vitals reviewed.   Ortho Exam Left shoulder exam shows a positive impingement sign.  Mildly positive pain with rotator cuff testing.  Negative cross adduction sign.  He does have discomfort with rotation of the humeral head with the arm by the side.  Specialty Comments:  No specialty comments available.  Imaging: No results found.   PMFS History: Patient Active Problem List  Diagnosis Date Noted  . Tendinopathy of left rotator cuff 01/26/2017  . Impingement syndrome of left shoulder 01/26/2017  . Primary osteoarthritis, left shoulder 01/26/2017  . HNP (herniated nucleus pulposus), lumbar 11/30/2016  . Obesity (BMI 30.0-34.9) 06/21/2016  . Varicose vein of leg 06/21/2016  . Chronic left-sided low back pain with left-sided sciatica 03/22/2016  . Chronic hip pain, left 03/22/2016  . History of colonic polyps 03/22/2016  . Essential hypertension, benign 03/22/2016  . Pure hypercholesterolemia 03/22/2016  . Benign essential tremor 03/22/2016  . Hyperglycemia 03/22/2016    Past Medical History:  Diagnosis Date  . Allergic rhinitis   . Arthritis   . Cancer (HCC)    basal cell nose  . Carotid atherosclerosis, bilateral    dvt  ?14  . DVT (deep venous thrombosis) ([place]) 11/22/2012  . GERD (gastroesophageal reflux disease)    occ  . High blood pressure   . Hypercholesteremia   . Hyperlipidemia   . Left atrial dilatation   . LVH (left ventricular hypertrophy)   . Obesity   . Seizures ([place])    "in 20's from aspirin"    Family History  Problem Relation Age of Onset  . Alzheimer's disease Mother   . Cancer Mother        Skin  . Alzheimer's disease Father   . Cancer Father        colon, liver, skin  . Colon polyps Father   . Colon cancer Father     Past Surgical History:  Procedure Laterality Date  . LUMBAR LAMINECTOMY/DECOMPRESSION MICRODISCECTOMY Left 11/30/2016   Procedure: LEFT LUMBAR FOUR-FIVE LAMINECTOMY FOR FACET/SYNOVIAL CYST;  Surgeon: Ashok Pall, MD;  Location: [place];  Service: Neurosurgery;  Laterality: Left;  LAMINECTOMY FOR FACET/SYNOVIAL CYST LUMBAR 4-LUMBAR 5, LEFT  . right leg fracture s/p surgical repair with screws Right 1966  . SKIN CANCER EXCISION  2010   from his nose, no recurrence  . TOTAL KNEE ARTHROPLASTY Right 2015   Social History   Occupational History  . Not on file.   Social History Main Topics  . Smoking status: Former Smoker    Packs/day: 1.00    Years: 20.00    Types: Cigarettes    Quit date: 11/27/1988  . Smokeless tobacco: Never Used  . Alcohol use No  . Drug use: No  . Sexual activity: Not on file

## 2017-03-09 ENCOUNTER — Ambulatory Visit (INDEPENDENT_AMBULATORY_CARE_PROVIDER_SITE_OTHER): Payer: Medicare Other | Admitting: Orthopaedic Surgery

## 2017-03-09 ENCOUNTER — Encounter (INDEPENDENT_AMBULATORY_CARE_PROVIDER_SITE_OTHER): Payer: Self-pay | Admitting: Orthopaedic Surgery

## 2017-03-09 DIAGNOSIS — M19012 Primary osteoarthritis, left shoulder: Secondary | ICD-10-CM

## 2017-03-09 DIAGNOSIS — M67912 Unspecified disorder of synovium and tendon, left shoulder: Secondary | ICD-10-CM | POA: Diagnosis not present

## 2017-03-09 DIAGNOSIS — M7542 Impingement syndrome of left shoulder: Secondary | ICD-10-CM

## 2017-03-09 NOTE — Progress Notes (Signed)
Office Visit Note   Patient: Colton Hart           Date of Birth: Jan 01, 1946           MRN: [id number] Visit Date: 03/09/2017              Requested by: Gayland Curry, DO [place], [place] [address] PCP: Gayland Curry, DO   Assessment & Plan: Visit Diagnoses:  1. Primary osteoarthritis, left shoulder   2. Tendinopathy of left rotator cuff   3. Impingement syndrome of left shoulder     Plan: Patient has improved significantly from the injection.  At this point we will have him follow-up as needed.  Follow-Up Instructions: Return if symptoms worsen or fail to improve.   Orders:  No orders of the defined types were placed in this encounter.  No orders of the defined types were placed in this encounter.     Procedures: No procedures performed   Clinical Data: No additional findings.   Subjective: Chief Complaint  Patient presents with  . Left Shoulder - Pain, Follow-up    Patient following up for his left shoulder pain.  He received a subacromial injection on 01/26/2017 and this has helped.  He never got the intra-articular steroid injection because his pain was improved.  He does have some discomfort when he sleeps on the shoulder at night.  Otherwise he does not have any real complaints.    Review of Systems   Objective: Vital Signs: There were no vitals taken for this visit.  Physical Exam  Ortho Exam Left shoulder exam is benign. Specialty Comments:  No specialty comments available.  Imaging: No results found.   PMFS History: Patient Active Problem List   Diagnosis Date Noted  . Tendinopathy of left rotator cuff 01/26/2017  . Impingement syndrome of left shoulder 01/26/2017  . Primary osteoarthritis, left shoulder 01/26/2017  . HNP (herniated nucleus pulposus), lumbar 11/30/2016  . Obesity (BMI 30.0-34.9) 06/21/2016  . Varicose vein of leg 06/21/2016  . Chronic left-sided low back pain with left-sided sciatica 03/22/2016  .  Chronic hip pain, left 03/22/2016  . History of colonic polyps 03/22/2016  . Essential hypertension, benign 03/22/2016  . Pure hypercholesterolemia 03/22/2016  . Benign essential tremor 03/22/2016  . Hyperglycemia 03/22/2016   Past Medical History:  Diagnosis Date  . Allergic rhinitis   . Arthritis   . Cancer (HCC)    basal cell nose  . Carotid atherosclerosis, bilateral    dvt  ?14  . DVT (deep venous thrombosis) ([place]) 11/22/2012  . GERD (gastroesophageal reflux disease)    occ  . High blood pressure   . Hypercholesteremia   . Hyperlipidemia   . Left atrial dilatation   . LVH (left ventricular hypertrophy)   . Obesity   . Seizures ([place])    "in 20's from aspirin"    Family History  Problem Relation Age of Onset  . Alzheimer's disease Mother   . Cancer Mother        Skin  . Alzheimer's disease Father   . Cancer Father        colon, liver, skin  . Colon polyps Father   . Colon cancer Father     Past Surgical History:  Procedure Laterality Date  . LUMBAR LAMINECTOMY/DECOMPRESSION MICRODISCECTOMY Left 11/30/2016   Procedure: LEFT LUMBAR FOUR-FIVE LAMINECTOMY FOR FACET/SYNOVIAL CYST;  Surgeon: Ashok Pall, MD;  Location: [place];  Service: Neurosurgery;  Laterality: Left;  LAMINECTOMY FOR  FACET/SYNOVIAL CYST LUMBAR 4-LUMBAR 5, LEFT  . right leg fracture s/p surgical repair with screws Right 1966  . SKIN CANCER EXCISION  2010   from his nose, no recurrence  . TOTAL KNEE ARTHROPLASTY Right 2015   Social History   Occupational History  . Not on file  Tobacco Use  . Smoking status: Former Smoker    Packs/day: 1.00    Years: 20.00    Pack years: 20.00    Types: Cigarettes    Last attempt to quit: 11/27/1988    Years since quitting: 28.2  . Smokeless tobacco: Never Used  Substance and Sexual Activity  . Alcohol use: No  . Drug use: No  . Sexual activity: Not on file

## 2017-03-21 ENCOUNTER — Encounter: Payer: Self-pay | Admitting: Internal Medicine

## 2017-03-21 ENCOUNTER — Non-Acute Institutional Stay: Payer: Medicare Other | Admitting: Internal Medicine

## 2017-03-21 VITALS — BP 118/70 | HR 98 | Temp 97.8°F | Wt 240.0 lb

## 2017-03-21 DIAGNOSIS — I10 Essential (primary) hypertension: Secondary | ICD-10-CM

## 2017-03-21 DIAGNOSIS — M7542 Impingement syndrome of left shoulder: Secondary | ICD-10-CM

## 2017-03-21 DIAGNOSIS — E7800 Pure hypercholesterolemia, unspecified: Secondary | ICD-10-CM

## 2017-03-21 DIAGNOSIS — G8929 Other chronic pain: Secondary | ICD-10-CM | POA: Diagnosis not present

## 2017-03-21 DIAGNOSIS — M5442 Lumbago with sciatica, left side: Secondary | ICD-10-CM | POA: Diagnosis not present

## 2017-03-21 DIAGNOSIS — G250 Essential tremor: Secondary | ICD-10-CM | POA: Diagnosis not present

## 2017-03-21 DIAGNOSIS — E669 Obesity, unspecified: Secondary | ICD-10-CM | POA: Diagnosis not present

## 2017-03-21 NOTE — Progress Notes (Signed)
Location:  Occupational psychologist of Service:  Clinic (12)  Provider: Salam Chesterfield L. Mariea Clonts, D.O., C.M.D.  Code Status:DNR Goals of Care:  Advanced Directives 03/21/2017  Does Patient Have a Medical Advance Directive? Yes  Type of Advance Directive [place]  Does patient want to make changes to medical advance directive? No - Patient declined  Copy of [place] in Chart? Yes     Chief Complaint  Patient presents with  . Medical Management of Chronic Issues    25mth follow-up, discuss tremors    HPI: Patient is a 71 y.o. male seen today for medical management of chronic diseases.    Pt is being followed by Dr. Erlinda Hong since his left shoulder pain began.  He had the following diagnoses:  Primary osteoarthritis, left shoulder ,Tendinopathy of left rotator cuff, Impingement syndrome of left shoulder. He had an injection on 01/26/17 in his left subacromial bursa. Says it was like the fountain of youth for a few days.  That pain is gone.  Reports sleeping on his left arm under his pillow--has quit this now.  Has difficulty sleeping on his back.  Hurts if he sleeps on the left side for even 10-15 mins.    Earlier this year, he was having back pain with sciatica and saw Dr. Christella Noa.  He underwent L4-5 laminectomy and synovial cyst removal on 11/30/16.  Still doing much better.  If he has trouble going to sleep, he sits up and leans against the headboard.  Goes down leg.  No pain in spine itself. Will have to change position for leg pressure to go away.     Reports tremor has gotten worse.  It's bad when he goes through the bistro and serves himself--says it's like a 71 yo has been through there.    Bothers him when holding things--rattles if he carries a plate with silverware and holding a cup of coffee will overflow. Also notices difficulty with the keyboard.    Also discussed gabapentin which helped his back and sciatica.  Reports he did great  with one of these but had side effects with more.      Talked to Dr. Havery Moros and was told not due until 2020 for cscope.    Plans to start shingrix series in January.  He is not a high risk individual for hep C and opted out of screening. Wants to get Prevnar 13 in the new year.    Past Medical History:  Diagnosis Date  . Allergic rhinitis   . Arthritis   . Cancer (HCC)    basal cell nose  . Carotid atherosclerosis, bilateral    dvt  ?14  . DVT (deep venous thrombosis) ([place]) 11/22/2012  . GERD (gastroesophageal reflux disease)    occ  . High blood pressure   . Hypercholesteremia   . Hyperlipidemia   . Left atrial dilatation   . LVH (left ventricular hypertrophy)   . Obesity   . Seizures ([place])    "in 20's from aspirin"    Past Surgical History:  Procedure Laterality Date  . LUMBAR LAMINECTOMY/DECOMPRESSION MICRODISCECTOMY Left 11/30/2016   Procedure: LEFT LUMBAR FOUR-FIVE LAMINECTOMY FOR FACET/SYNOVIAL CYST;  Surgeon: Ashok Pall, MD;  Location: [facility];  Service: Neurosurgery;  Laterality: Left;  LAMINECTOMY FOR FACET/SYNOVIAL CYST LUMBAR 4-LUMBAR 5, LEFT  . right leg fracture s/p surgical repair with screws Right 1966  . SKIN CANCER EXCISION  2010   from his nose, no recurrence  .  TOTAL KNEE ARTHROPLASTY Right 2015    Allergies  Allergen Reactions  . Asa [Aspirin] Other (See Comments)    Seizures     Outpatient Encounter Medications as of 03/21/2017  Medication Sig  . acetaminophen (TYLENOL) 500 MG tablet Take 500 mg by mouth at bedtime as needed for moderate pain.   Marland Kitchen atorvastatin (LIPITOR) 20 MG tablet Take 1 tablet (20 mg total) by mouth daily.  . calcium carbonate (TUMS - DOSED IN MG ELEMENTAL CALCIUM) 500 MG chewable tablet Chew 1 tablet by mouth as needed for indigestion or heartburn.  . Cholecalciferol (VITAMIN D3) 2000 units TABS Take 2,000 Units by mouth every evening.   . gabapentin (NEURONTIN) 100 MG capsule Take 1 capsule (100 mg total) by mouth at  bedtime.  Marland Kitchen losartan-hydrochlorothiazide (HYZAAR) 100-12.5 MG tablet Take 1 tablet by mouth daily.  . naproxen sodium (ALEVE) 220 MG tablet Take 220 mg by mouth daily as needed (pain).   No facility-administered encounter medications on file as of 03/21/2017.     Review of Systems:  Review of Systems  Constitutional: Negative for chills and fever.  HENT: Positive for congestion and hearing loss.   Eyes: Negative for blurred vision.       Glasses  Respiratory: Negative for cough and shortness of breath.   Cardiovascular: Negative for chest pain, palpitations and leg swelling.  Gastrointestinal: Negative for abdominal pain, blood in stool, constipation and melena.  Genitourinary: Negative for dysuria.  Musculoskeletal: Positive for back pain and joint pain. Negative for falls.  Neurological: Positive for tremors. Negative for dizziness and loss of consciousness.  Endo/Heme/Allergies: Does not bruise/bleed easily.  Psychiatric/Behavioral: Negative for depression and memory loss. The patient is not nervous/anxious.     Health Maintenance  Topic Date Due  . Hepatitis C Screening  1945-12-08  . PNA vac Low Risk Adult (2 of 2 - PCV13) 01/02/2016  . TETANUS/TDAP  04/04/2023  . COLONOSCOPY  01/31/2024  . INFLUENZA VACCINE  Completed    Physical Exam: Vitals:   03/21/17 0828  BP: 118/70  Pulse: 98  Temp: 97.8 F (36.6 C)  TempSrc: Oral  SpO2: 95%  Weight: 240 lb (108.9 kg)   Body mass index is 33.47 kg/m. Physical Exam  Constitutional: He is oriented to person, place, and time. He appears well-developed and well-nourished. No distress.  HENT:  Head: Normocephalic and atraumatic.  Eyes:  glasses  Cardiovascular: Normal rate, regular rhythm, normal heart sounds and intact distal pulses.  Pulmonary/Chest: Effort normal and breath sounds normal. No respiratory distress.  Abdominal: Bowel sounds are normal.  Musculoskeletal: Normal range of motion.  Neurological: He is alert  and oriented to person, place, and time. No cranial nerve deficit.  Intention tremor left greater than right  Skin: Skin is warm and dry.  Psychiatric: He has a normal mood and affect. His behavior is normal. Judgment and thought content normal.    Labs reviewed: Basic Metabolic Panel: Recent Labs    03/24/16 09/12/16 0800 11/27/16 0826  NA 142 140 140  K 4.2 4.1 3.9  CL  --   --  107  CO2  --   --  24  GLUCOSE  --   --  108*  BUN 17 11 13   CREATININE 1.1 1.1 1.18  CALCIUM  --   --  9.4   Liver Function Tests: Recent Labs    03/24/16 0202  AST 25  ALT 38  ALKPHOS 64   No results for input(s): LIPASE, AMYLASE in  the last 8760 hours. No results for input(s): AMMONIA in the last 8760 hours. CBC: Recent Labs    03/23/16 11/27/16 0826  WBC 6.7 8.8  HGB 16.2 15.6  HCT 48 45.3  MCV  --  95.6  PLT 256 243   Lipid Panel: Recent Labs    09/12/16 0800  CHOL 140  HDL 35  LDLCALC 79  TRIG 127   Lab Results  Component Value Date   HGBA1C 5.6 09/12/2016    Procedures since last visit: No results found.  Assessment/Plan 1. Benign essential tremor -resume gabapentin 100mg  po qhs which he believes did help his tremor when he took it for his back  2. Impingement syndrome of left shoulder -much better after injection w/ Dr. Erlinda Hong -bothersome at night, but he thinks part of it is his mattress  3. Obesity (BMI 30.0-34.9) -cont exercise classes and activities here  4. Chronic left-sided low back pain with left-sided sciatica -not bothersome continually -should be better back on gabapentin   5. Essential hypertension, benign -bp at goal with hyzaar, but may need to reduce if use propranolol for tremor (if gabapentin inadequate)  6. Pure hypercholesterolemia -lipids have been at goal with lipitor -encourage healthy diet and exercise -f/u labs before next visit in 6 mos  Labs/tests ordered:  Cbc, cmp, flp Next appt: 6 mos med mgt labs before Plans to start  shingrix series in January. Prevnar in new year also.  Priseis Cratty L. Senica Crall, D.O. [facility] [address], [place] [address] Cell Phone (Mon-Fri 8am-5pm):  [phone] On Call:  [phone] & follow prompts after 5pm & weekends Office Phone:  [phone] Office Fax:  [phone]

## 2017-04-04 ENCOUNTER — Encounter: Payer: Self-pay | Admitting: Internal Medicine

## 2017-04-04 DIAGNOSIS — I10 Essential (primary) hypertension: Secondary | ICD-10-CM

## 2017-04-04 DIAGNOSIS — M25552 Pain in left hip: Secondary | ICD-10-CM

## 2017-04-04 DIAGNOSIS — E7800 Pure hypercholesterolemia, unspecified: Secondary | ICD-10-CM

## 2017-04-04 DIAGNOSIS — G8929 Other chronic pain: Secondary | ICD-10-CM

## 2017-04-04 DIAGNOSIS — M5442 Lumbago with sciatica, left side: Secondary | ICD-10-CM

## 2017-04-05 MED ORDER — LOSARTAN POTASSIUM-HCTZ 100-12.5 MG PO TABS: 1.0000 | ORAL_TABLET | Freq: Every day | ORAL | 3 refills | Status: DC

## 2017-04-05 MED ORDER — ATORVASTATIN CALCIUM 20 MG PO TABS: 20.0000 mg | ORAL_TABLET | Freq: Every day | ORAL | 3 refills | Status: DC

## 2017-04-05 MED ORDER — GABAPENTIN 100 MG PO CAPS: 100.0000 mg | ORAL_CAPSULE | Freq: Every day | ORAL | 3 refills | Status: DC

## 2017-04-05 MED ORDER — ZOSTER VAC RECOMB ADJUVANTED 50 MCG/0.5ML IM SUSR: 0.5000 mL | Freq: Once | INTRAMUSCULAR | 0 refills | Status: AC

## 2017-04-05 NOTE — Addendum Note (Signed)
Addended by: Despina Hidden on: 04/05/2017 01:17 PM   Modules accepted: Orders

## 2017-04-20 ENCOUNTER — Other Ambulatory Visit: Payer: Self-pay | Admitting: *Deleted

## 2017-04-20 DIAGNOSIS — E7800 Pure hypercholesterolemia, unspecified: Secondary | ICD-10-CM

## 2017-04-20 MED ORDER — ATORVASTATIN CALCIUM 20 MG PO TABS: 20.0000 mg | ORAL_TABLET | Freq: Every day | ORAL | 1 refills | Status: DC

## 2017-04-20 NOTE — Telephone Encounter (Signed)
[facility] 

## 2017-07-04 ENCOUNTER — Telehealth: Payer: Self-pay | Admitting: *Deleted

## 2017-07-04 NOTE — Telephone Encounter (Signed)
-----   Message from Enlow Robinsons sent at 07/04/2017  8:42 AM EDT ----- Regarding: Appointment Contact: [phone] Mr.Westrup called this morning for an appointment to see Dr. Mariea Clonts today at [facility].  He is having hip pain that has gotten worse.  He would like to see Dr. Mariea Clonts today if possible.  She did not have any open appointments on her schedule.  Please give patient a call to let him know if it's possible to be worked in today.

## 2017-07-04 NOTE — Telephone Encounter (Signed)
Left message on machine that we do not have any openings at [facility] today and will not be able to work anyone in. Asked if pt wanted to come to the main office today or tomorrow with Dr. Mariea Clonts.  Asked pt to return call to main office

## 2017-07-04 NOTE — Telephone Encounter (Signed)
Pt scheduled appt at the main office 07/05/17

## 2017-07-05 ENCOUNTER — Encounter: Payer: Self-pay | Admitting: Internal Medicine

## 2017-07-05 ENCOUNTER — Ambulatory Visit (INDEPENDENT_AMBULATORY_CARE_PROVIDER_SITE_OTHER): Payer: Medicare Other | Admitting: Internal Medicine

## 2017-07-05 VITALS — BP 120/80 | HR 73 | Temp 98.1°F | Ht 71.0 in | Wt 245.8 lb

## 2017-07-05 DIAGNOSIS — G8929 Other chronic pain: Secondary | ICD-10-CM

## 2017-07-05 DIAGNOSIS — R1030 Lower abdominal pain, unspecified: Secondary | ICD-10-CM

## 2017-07-05 DIAGNOSIS — R1032 Left lower quadrant pain: Secondary | ICD-10-CM

## 2017-07-05 DIAGNOSIS — Z23 Encounter for immunization: Secondary | ICD-10-CM

## 2017-07-05 DIAGNOSIS — I10 Essential (primary) hypertension: Secondary | ICD-10-CM

## 2017-07-05 DIAGNOSIS — E669 Obesity, unspecified: Secondary | ICD-10-CM | POA: Diagnosis not present

## 2017-07-05 DIAGNOSIS — M25552 Pain in left hip: Secondary | ICD-10-CM | POA: Diagnosis not present

## 2017-07-05 MED ORDER — TELMISARTAN-HCTZ 80-12.5 MG PO TABS: 1.0000 | ORAL_TABLET | Freq: Every day | ORAL | 3 refills | Status: DC

## 2017-07-05 NOTE — Progress Notes (Signed)
Location:  [facility] clinic Provider: Taffany Heiser L. Mariea Clonts, D.O., C.M.D.  Code Status: full code Goals of Care:  Advanced Directives 03/21/2017  Does Patient Have a Medical Advance Directive? Yes  Type of Advance Directive [place]  Does patient want to make changes to medical advance directive? No - Patient declined  Copy of [place] in Chart? Yes     Chief Complaint  Patient presents with  . Acute Visit    Pt stated having pain in L hip in lower back and in joints when standing and put weight on it;(Taking Tylenol and Aleve)for pain; Pain level 10  . Medication Refill    No Refills  . ACP    POA    HPI: Patient is a 72 y.o. male seen today for an acute visit for left hip pain that has suddenly gotten worse.   Has been having a pain in his left hip, but much worse the past couple of months.  Two things happening. Can't life the left leg over to turn in the bed. What's worse, when he goes to stand up on it, he gets a horrible pain.  Was afraid he'd fall when he stood up. Along with it, he gets pain in his lower back.  He thinks it's compensating for his hip. It's fine when sitting, but bad when walking or standing for any period of time. Has been using tylenol and aleve for pain.  Aleve works better.  Taking mostly tylenol and sitting much of the day.  Will hobble around at first, but does lighten up after a bit.  The 10/10 is when he first gets out of bed.   Sits with his laptop in his lap with his left leg over his right knee.  Cannot move the right that way.  He's trying to avoid that position.  Requests his pneumonia vaccine today--needs prevnar.  Past Medical History:  Diagnosis Date  . Allergic rhinitis   . Arthritis   . Cancer (HCC)    basal cell nose  . Carotid atherosclerosis, bilateral    dvt  ?14  . DVT (deep venous thrombosis) ([place]) 11/22/2012  . GERD (gastroesophageal reflux disease)    occ  . High blood pressure   .  Hypercholesteremia   . Hyperlipidemia   . Left atrial dilatation   . LVH (left ventricular hypertrophy)   . Obesity   . Seizures ([place])    "in 20's from aspirin"    Past Surgical History:  Procedure Laterality Date  . LUMBAR LAMINECTOMY/DECOMPRESSION MICRODISCECTOMY Left 11/30/2016   Procedure: LEFT LUMBAR FOUR-FIVE LAMINECTOMY FOR FACET/SYNOVIAL CYST;  Surgeon: Ashok Pall, MD;  Location: [facility];  Service: Neurosurgery;  Laterality: Left;  LAMINECTOMY FOR FACET/SYNOVIAL CYST LUMBAR 4-LUMBAR 5, LEFT  . right leg fracture s/p surgical repair with screws Right 1966  . SKIN CANCER EXCISION  2010   from his nose, no recurrence  . TOTAL KNEE ARTHROPLASTY Right 2015    Allergies  Allergen Reactions  . Asa [Aspirin] Other (See Comments)    Seizures     Outpatient Encounter Medications as of 07/05/2017  Medication Sig  . acetaminophen (TYLENOL) 500 MG tablet Take 500 mg by mouth at bedtime as needed for moderate pain.   Marland Kitchen atorvastatin (LIPITOR) 20 MG tablet Take 1 tablet (20 mg total) by mouth daily.  . calcium carbonate (TUMS - DOSED IN MG ELEMENTAL CALCIUM) 500 MG chewable tablet Chew 1 tablet by mouth as needed for indigestion or  heartburn.  . Cholecalciferol (VITAMIN D3) 2000 units TABS Take 2,000 Units by mouth every evening.   . gabapentin (NEURONTIN) 100 MG capsule Take 1 capsule (100 mg total) by mouth at bedtime.  Marland Kitchen losartan-hydrochlorothiazide (HYZAAR) 100-12.5 MG tablet Take 1 tablet by mouth daily.  . naproxen sodium (ALEVE) 220 MG tablet Take 220 mg by mouth daily as needed (pain).   No facility-administered encounter medications on file as of 07/05/2017.     Review of Systems:  Review of Systems  Constitutional: Negative for chills, fever and malaise/fatigue.  HENT: Negative for congestion.   Eyes: Negative for blurred vision.       Glasses  Respiratory: Negative for shortness of breath.   Cardiovascular: Negative for chest pain.  Gastrointestinal: Negative for  abdominal pain.  Genitourinary: Negative for dysuria.  Musculoskeletal: Positive for back pain and joint pain. Negative for falls.  Skin: Negative for itching and rash.  Neurological: Negative for dizziness.  Psychiatric/Behavioral: Negative for depression and memory loss. The patient does not have insomnia.     Health Maintenance  Topic Date Due  . Hepatitis C Screening  1945-07-22  . PNA vac Low Risk Adult (2 of 2 - PCV13) 01/02/2016  . INFLUENZA VACCINE  11/01/2017  . TETANUS/TDAP  04/04/2023  . COLONOSCOPY  01/31/2024    Physical Exam: Vitals:   07/05/17 1052  BP: 120/80  Pulse: 73  Temp: 98.1 F (36.7 C)  TempSrc: Oral  SpO2: 96%  Weight: 245 lb 12.8 oz (111.5 kg)  Height: 5\' 11"  (1.803 m)   Body mass index is 34.28 kg/m. Physical Exam  Constitutional: He is oriented to person, place, and time. He appears well-developed and well-nourished.  Cardiovascular: Normal rate, regular rhythm, normal heart sounds and intact distal pulses.  Pulmonary/Chest: Effort normal and breath sounds normal. No respiratory distress.  Musculoskeletal: Normal range of motion. He exhibits no tenderness or deformity.  Very slight offloading of left hip  Neurological: He is alert and oriented to person, place, and time.  Psychiatric: He has a normal mood and affect.    Labs reviewed: Basic Metabolic Panel: Recent Labs    09/12/16 0800 11/27/16 0826  NA 140 140  K 4.1 3.9  CL  --  107  CO2  --  24  GLUCOSE  --  108*  BUN 11 13  CREATININE 1.1 1.18  CALCIUM  --  9.4   Liver Function Tests: No results for input(s): AST, ALT, ALKPHOS, BILITOT, PROT, ALBUMIN in the last 8760 hours. No results for input(s): LIPASE, AMYLASE in the last 8760 hours. No results for input(s): AMMONIA in the last 8760 hours. CBC: Recent Labs    11/27/16 0826  WBC 8.8  HGB 15.6  HCT 45.3  MCV 95.6  PLT 243   Lipid Panel: Recent Labs    09/12/16 0800  CHOL 140  HDL 35  LDLCALC 79  TRIG 127    Lab Results  Component Value Date   HGBA1C 5.6 09/12/2016    Assessment/Plan 1. Chronic hip pain, left - suspect progressive OA - Ambulatory referral to Orthopedic Surgery  2. Severe left groin pain - with concern for giving way, suspect progressive OA of hip, will refer to orthopedics for possible injection, need for further imaging or surgical intervention, was mild on xrays when obtained with his back pain 04/05/16 - Ambulatory referral to Orthopedic Surgery  3. Essential hypertension, benign -bp at goal, discussed changing from losartan/hctz to one of the newer arb/hctz combos that may  actually be more effective due to recalls that have been going on, but he reports his particular lot and brand was not included -if cost prohibitive, we may not change after all  4. Obesity (BMI 30.0-34.9) -ongoing, encouraged increased physical activity and decreased portion sizes, sweets and starchy foods  5. Need for vaccination with 13-polyvalent pneumococcal conjugate vaccine -prevnar given today  Labs/tests ordered:   Orders Placed This Encounter  Procedures  . Ambulatory referral to Orthopedic Surgery    Referral Priority:   Routine    Referral Type:   Surgical    Referral Reason:   Specialty Services Required    Requested Specialty:   Orthopedic Surgery    Number of Visits Requested:   1   Next appt:  09/19/2017  Coen Miyasato L. Tika Hannis, D.O. [facility] [address], [place] [address] Cell Phone (Mon-Fri 8am-5pm):  [phone] On Call:  [phone] & follow prompts after 5pm & weekends Office Phone:  [phone] Office Fax:  [phone]

## 2017-07-05 NOTE — Patient Instructions (Signed)
Please cut down on the desserts.  Losing weight will also help your back and hips.    I have referred you to Dr. Erlinda Hong for your hip pain.

## 2017-07-09 ENCOUNTER — Encounter (INDEPENDENT_AMBULATORY_CARE_PROVIDER_SITE_OTHER): Payer: Self-pay | Admitting: Orthopaedic Surgery

## 2017-07-09 ENCOUNTER — Ambulatory Visit (INDEPENDENT_AMBULATORY_CARE_PROVIDER_SITE_OTHER): Payer: Medicare Other

## 2017-07-09 ENCOUNTER — Ambulatory Visit (INDEPENDENT_AMBULATORY_CARE_PROVIDER_SITE_OTHER): Payer: Medicare Other | Admitting: Orthopaedic Surgery

## 2017-07-09 DIAGNOSIS — M1612 Unilateral primary osteoarthritis, left hip: Secondary | ICD-10-CM | POA: Diagnosis not present

## 2017-07-09 NOTE — Progress Notes (Signed)
Patient   Office Visit Note   Patient: Colton Hart           Date of Birth: 1945/10/07           MRN: [id number] Visit Date: 07/09/2017              Requested by: Gayland Curry, DO [place], [place] [address] PCP: Gayland Curry, DO   Assessment & Plan: Visit Diagnoses:  1. Primary osteoarthritis of left hip     Plan: Impression is left hip arthritis and degenerative joint disease.  I recommend intra-articular steroid injection with Dr. Ernestina Patches which we will set him up with.  Patient is in agreement with this.  Questions encouraged and answered.  Follow-up as needed.  Follow-Up Instructions: Return if symptoms worsen or fail to improve.   Orders:  Orders Placed This Encounter  Procedures  . XR HIP UNILAT W OR W/O PELVIS 2-3 VIEWS LEFT  . Ambulatory referral to Physical Medicine Rehab   No orders of the defined types were placed in this encounter.     Procedures: No procedures performed   Clinical Data: No additional findings.   Subjective: Chief Complaint  Patient presents with  . Left Hip - Pain    Patient is a 72 year old gentleman comes in with worsening left hip pain.  I saw him back a few years ago for this.  This is getting worse.  It is worse in the morning and after prolonged standing.  Denies any radicular symptoms.   Review of Systems  Constitutional: Negative.   All other systems reviewed and are negative.    Objective: Vital Signs: There were no vitals taken for this visit.  Physical Exam  Constitutional: He is oriented to person, place, and time. He appears well-developed and well-nourished.  Pulmonary/Chest: Effort normal.  Abdominal: Soft.  Neurological: He is alert and oriented to person, place, and time.  Skin: Skin is warm.  Psychiatric: He has a normal mood and affect. His behavior is normal. Judgment and thought content normal.  Nursing note and vitals reviewed.   Ortho Exam Left hip exam shows decreased internal  rotation without any significant pain with external or internal rotation.  Negative Stinchfield sign.  Lateral hip is nontender.  Negative straight leg. Specialty Comments:  No specialty comments available.  Imaging: Xr Hip Unilat W Or W/o Pelvis 2-3 Views Left  Result Date: 07/09/2017 Degenerative joint disease left hip with moderate joint space narrowing    PMFS History: Patient Active Problem List   Diagnosis Date Noted  . Tendinopathy of left rotator cuff 01/26/2017  . Impingement syndrome of left shoulder 01/26/2017  . Primary osteoarthritis, left shoulder 01/26/2017  . HNP (herniated nucleus pulposus), lumbar 11/30/2016  . Obesity (BMI 30.0-34.9) 06/21/2016  . Varicose vein of leg 06/21/2016  . Chronic left-sided low back pain with left-sided sciatica 03/22/2016  . Chronic hip pain, left 03/22/2016  . History of colonic polyps 03/22/2016  . Essential hypertension, benign 03/22/2016  . Pure hypercholesterolemia 03/22/2016  . Benign essential tremor 03/22/2016  . Hyperglycemia 03/22/2016   Past Medical History:  Diagnosis Date  . Allergic rhinitis   . Arthritis   . Cancer (HCC)    basal cell nose  . Carotid atherosclerosis, bilateral    dvt  ?14  . DVT (deep venous thrombosis) ([place]) 11/22/2012  . GERD (gastroesophageal reflux disease)    occ  . High blood pressure   . Hypercholesteremia   . Hyperlipidemia   .  Left atrial dilatation   . LVH (left ventricular hypertrophy)   . Obesity   . Seizures ([place])    "in 20's from aspirin"    Family History  Problem Relation Age of Onset  . Alzheimer's disease Mother   . Cancer Mother        Skin  . Alzheimer's disease Father   . Cancer Father        colon, liver, skin  . Colon polyps Father   . Colon cancer Father     Past Surgical History:  Procedure Laterality Date  . LUMBAR LAMINECTOMY/DECOMPRESSION MICRODISCECTOMY Left 11/30/2016   Procedure: LEFT LUMBAR FOUR-FIVE LAMINECTOMY FOR FACET/SYNOVIAL CYST;  Surgeon:  Ashok Pall, MD;  Location: [facility];  Service: Neurosurgery;  Laterality: Left;  LAMINECTOMY FOR FACET/SYNOVIAL CYST LUMBAR 4-LUMBAR 5, LEFT  . right leg fracture s/p surgical repair with screws Right 1966  . SKIN CANCER EXCISION  2010   from his nose, no recurrence  . TOTAL KNEE ARTHROPLASTY Right 2015   Social History   Occupational History  . Not on file  Tobacco Use  . Smoking status: Former Smoker    Packs/day: 1.00    Years: 20.00    Pack years: 20.00    Types: Cigarettes    Last attempt to quit: 11/27/1988    Years since quitting: 28.6  . Smokeless tobacco: Never Used  Substance and Sexual Activity  . Alcohol use: No  . Drug use: No  . Sexual activity: Not on file

## 2017-07-12 ENCOUNTER — Encounter: Payer: Self-pay | Admitting: Internal Medicine

## 2017-07-12 DIAGNOSIS — I10 Essential (primary) hypertension: Secondary | ICD-10-CM

## 2017-07-12 MED ORDER — LOSARTAN POTASSIUM-HCTZ 100-12.5 MG PO TABS: 1.0000 | ORAL_TABLET | Freq: Every day | ORAL | 3 refills | Status: DC

## 2017-07-13 NOTE — Telephone Encounter (Signed)
Spoke with patient on the phone, pt switched back to losartan and had refills so he took it upon himself to call in refill to [facility]. Pt was not asking for a refill he was asking for approval. Pt was not nice and said since starting with [place], his refills have been messed up. I tried explaining to pt he need to be more clearer.

## 2017-07-23 ENCOUNTER — Ambulatory Visit (INDEPENDENT_AMBULATORY_CARE_PROVIDER_SITE_OTHER): Payer: Medicare Other

## 2017-07-23 ENCOUNTER — Encounter (INDEPENDENT_AMBULATORY_CARE_PROVIDER_SITE_OTHER): Payer: Self-pay | Admitting: Physical Medicine and Rehabilitation

## 2017-07-23 ENCOUNTER — Ambulatory Visit (INDEPENDENT_AMBULATORY_CARE_PROVIDER_SITE_OTHER): Payer: Medicare Other | Admitting: Physical Medicine and Rehabilitation

## 2017-07-23 DIAGNOSIS — M25552 Pain in left hip: Secondary | ICD-10-CM | POA: Diagnosis not present

## 2017-07-23 NOTE — Progress Notes (Signed)
Colton Hart - 72 y.o. male MRN [id number]  Date of birth: January 28, 1946  Office Visit Note: Visit Date: 07/23/2017 PCP: Gayland Curry, DO Referred by: Gayland Curry, DO  Subjective: Chief Complaint  Patient presents with  . Left Hip - Pain   HPI: Mr. Colton Hart is a 72 year old gentleman who comes in today at the request of Dr. Erlinda Hong for diagnostic and hopefully therapeutic anesthetic hip arthrogram on the right.  The patient is having chronic worsening severe at times right hip and groin pain.   ROS Otherwise per HPI.  Assessment & Plan: Visit Diagnoses:  1. Pain in left hip     Plan: Findings:  Diagnostic note for therapeutic anesthetic hip arthrogram.  Patient did have relief during the anesthetic phase of the injection.    Meds & Orders: No orders of the defined types were placed in this encounter.   Orders Placed This Encounter  Procedures  . Large Joint Inj: L hip joint  . XR C-ARM NO REPORT    Follow-up: Return if symptoms worsen or fail to improve, for Dr. Erlinda Hong.   Procedures: Large Joint Inj: L hip joint on 07/23/2017 9:57 AM Indications: pain and diagnostic evaluation Details: 22 G needle, anterior approach  Arthrogram: Yes  Medications: 80 mg triamcinolone acetonide 40 MG/ML; 3 mL bupivacaine 0.5 % Outcome: tolerated well, no immediate complications  Arthrogram demonstrated excellent flow of contrast throughout the joint surface without extravasation or obvious defect.  The patient had relief of symptoms during the anesthetic phase of the injection.  Procedure, treatment alternatives, risks and benefits explained, specific risks discussed. Consent was given by the patient. Immediately prior to procedure a time out was called to verify the correct patient, procedure, equipment, support staff and site/side marked as required. Patient was prepped and draped in the usual sterile fashion.      No notes on file   Clinical History: No specialty comments available.    He reports that he quit smoking about 28 years ago. His smoking use included cigarettes. He has a 20.00 pack-year smoking history. He has never used smokeless tobacco.  Recent Labs    09/12/16 0800  HGBA1C 5.6    Objective:  VS:  HT:    WT:   BMI:     BP:   HR: bpm  TEMP: ( )  RESP:  Physical Exam  Musculoskeletal:  Concordant pain with right hip rotation.    Ortho Exam Imaging: Xr C-arm No Report  Result Date: 07/23/2017 Please see Notes or Procedures tab for imaging impression.   Past Medical/Family/Surgical/Social History: Medications & Allergies reviewed per EMR, new medications updated. Patient Active Problem List   Diagnosis Date Noted  . Tendinopathy of left rotator cuff 01/26/2017  . Impingement syndrome of left shoulder 01/26/2017  . Primary osteoarthritis, left shoulder 01/26/2017  . HNP (herniated nucleus pulposus), lumbar 11/30/2016  . Obesity (BMI 30.0-34.9) 06/21/2016  . Varicose vein of leg 06/21/2016  . Chronic left-sided low back pain with left-sided sciatica 03/22/2016  . Chronic hip pain, left 03/22/2016  . History of colonic polyps 03/22/2016  . Essential hypertension, benign 03/22/2016  . Pure hypercholesterolemia 03/22/2016  . Benign essential tremor 03/22/2016  . Hyperglycemia 03/22/2016   Past Medical History:  Diagnosis Date  . Allergic rhinitis   . Arthritis   . Cancer (HCC)    basal cell nose  . Carotid atherosclerosis, bilateral    dvt  ?14  . DVT (deep venous thrombosis) ([place]) 11/22/2012  .  GERD (gastroesophageal reflux disease)    occ  . High blood pressure   . Hypercholesteremia   . Hyperlipidemia   . Left atrial dilatation   . LVH (left ventricular hypertrophy)   . Obesity   . Seizures ([place])    "in 20's from aspirin"   Family History  Problem Relation Age of Onset  . Alzheimer's disease Mother   . Cancer Mother        Skin  . Alzheimer's disease Father   . Cancer Father        colon, liver, skin  . Colon polyps  Father   . Colon cancer Father    Past Surgical History:  Procedure Laterality Date  . LUMBAR LAMINECTOMY/DECOMPRESSION MICRODISCECTOMY Left 11/30/2016   Procedure: LEFT LUMBAR FOUR-FIVE LAMINECTOMY FOR FACET/SYNOVIAL CYST;  Surgeon: Ashok Pall, MD;  Location: [facility];  Service: Neurosurgery;  Laterality: Left;  LAMINECTOMY FOR FACET/SYNOVIAL CYST LUMBAR 4-LUMBAR 5, LEFT  . right leg fracture s/p surgical repair with screws Right 1966  . SKIN CANCER EXCISION  2010   from his nose, no recurrence  . TOTAL KNEE ARTHROPLASTY Right 2015   Social History   Occupational History  . Not on file  Tobacco Use  . Smoking status: Former Smoker    Packs/day: 1.00    Years: 20.00    Pack years: 20.00    Types: Cigarettes    Last attempt to quit: 11/27/1988    Years since quitting: 28.6  . Smokeless tobacco: Never Used  Substance and Sexual Activity  . Alcohol use: No  . Drug use: No  . Sexual activity: Not on file

## 2017-07-23 NOTE — Patient Instructions (Signed)

## 2017-07-23 NOTE — Progress Notes (Signed)
   Numeric Pain Rating Scale and Functional Assessment Average Pain 5   In the last MONTH (on 0-10 scale) has pain interfered with the following?  1. General activity like being  able to carry out your everyday physical activities such as walking, climbing stairs, carrying groceries, or moving a chair?  Rating(1)   +Driver, -BT, -Dye Allergies.

## 2017-07-24 MED ORDER — TRIAMCINOLONE ACETONIDE 40 MG/ML IJ SUSP
80.0000 mg | INTRAMUSCULAR | Status: AC | PRN
  Administered 2017-07-23: 80 mg via INTRA_ARTICULAR

## 2017-07-24 MED ORDER — BUPIVACAINE HCL 0.5 % IJ SOLN
3.0000 mL | INTRAMUSCULAR | Status: AC | PRN
  Administered 2017-07-23: 3 mL via INTRA_ARTICULAR

## 2017-09-11 DIAGNOSIS — G250 Essential tremor: Secondary | ICD-10-CM | POA: Diagnosis not present

## 2017-09-11 DIAGNOSIS — E7800 Pure hypercholesterolemia, unspecified: Secondary | ICD-10-CM | POA: Diagnosis not present

## 2017-09-11 DIAGNOSIS — I10 Essential (primary) hypertension: Secondary | ICD-10-CM | POA: Diagnosis not present

## 2017-09-11 DIAGNOSIS — E785 Hyperlipidemia, unspecified: Secondary | ICD-10-CM | POA: Diagnosis not present

## 2017-09-11 LAB — BASIC METABOLIC PANEL
BUN: 13 (ref 4–21)
Creatinine: 1 (ref 0.6–1.3)
Glucose: 144
Potassium: 4.5 (ref 3.4–5.3)
Sodium: 139 (ref 137–147)

## 2017-09-11 LAB — CBC AND DIFFERENTIAL
HCT: 45 (ref 41–53)
Hemoglobin: 15.7 (ref 13.5–17.5)
Platelets: 276 (ref 150–399)
WBC: 8

## 2017-09-11 LAB — LIPID PANEL
Cholesterol: 153 (ref 0–200)
HDL: 34 — AB (ref 35–70)
LDL Cholesterol: 87
Triglycerides: 159 (ref 40–160)

## 2017-09-11 LAB — HEPATIC FUNCTION PANEL
ALT: 48 — AB (ref 10–40)
AST: 29 (ref 14–40)
Alkaline Phosphatase: 61 (ref 25–125)
Bilirubin, Total: 0.6

## 2017-09-13 ENCOUNTER — Encounter: Payer: Self-pay | Admitting: Internal Medicine

## 2017-09-19 ENCOUNTER — Encounter: Payer: Self-pay | Admitting: Internal Medicine

## 2017-09-19 ENCOUNTER — Non-Acute Institutional Stay: Payer: Medicare Other | Admitting: Internal Medicine

## 2017-09-19 VITALS — BP 112/60 | HR 88 | Temp 98.5°F | Ht 71.0 in | Wt 234.0 lb

## 2017-09-19 DIAGNOSIS — R0789 Other chest pain: Secondary | ICD-10-CM

## 2017-09-19 DIAGNOSIS — E7800 Pure hypercholesterolemia, unspecified: Secondary | ICD-10-CM | POA: Diagnosis not present

## 2017-09-19 DIAGNOSIS — M159 Polyosteoarthritis, unspecified: Secondary | ICD-10-CM | POA: Diagnosis not present

## 2017-09-19 DIAGNOSIS — G8929 Other chronic pain: Secondary | ICD-10-CM

## 2017-09-19 DIAGNOSIS — E669 Obesity, unspecified: Secondary | ICD-10-CM | POA: Diagnosis not present

## 2017-09-19 DIAGNOSIS — G250 Essential tremor: Secondary | ICD-10-CM

## 2017-09-19 DIAGNOSIS — M25552 Pain in left hip: Secondary | ICD-10-CM | POA: Diagnosis not present

## 2017-09-19 DIAGNOSIS — R739 Hyperglycemia, unspecified: Secondary | ICD-10-CM | POA: Diagnosis not present

## 2017-09-19 DIAGNOSIS — Z23 Encounter for immunization: Secondary | ICD-10-CM | POA: Diagnosis not present

## 2017-09-19 MED ORDER — GABAPENTIN 100 MG PO CAPS: 200.0000 mg | ORAL_CAPSULE | Freq: Every day | ORAL | 3 refills | Status: DC

## 2017-09-19 NOTE — Progress Notes (Signed)
Location:  Occupational psychologist of Service:  Clinic (12)  Provider: Rayana Geurin L. Mariea Clonts, D.O., C.M.D.  Goals of Care:  Advanced Directives 09/19/2017  Does Patient Have a Medical Advance Directive? Yes  Type of Advance Directive [place]  Does patient want to make changes to medical advance directive? No - Patient declined  Copy of [place] in Chart? Yes     Chief Complaint  Patient presents with  . Medical Management of Chronic Issues    90mth follow-up    HPI: Patient is a 72 y.o. male seen today for medical management of chronic diseases.    Hips and shoulders are not as bad as they used to be.  Says he's aware he may have OA of his joints, but hs injection worked well.  If he stands a long time, his hip still grows weak, but does not hurt.  It has not tried to give out lately.  It had been when he was stepping down the stairs with that leg first.  He's learned not to do that now.  Not worried about falling.    He gets a chest pain that he thinks is in his rib cage.  Happens when he sits and leans back real briefly like seconds. Happens 2-3 times per week.  Not after exercise, more positional than anything.  Not pleuritic.  Not when exercising.  Can work up a good heart rate and it does not hurt.    Has a few questions: taking vitamin D 2000 units for 5-6 years after a blood test.  Advised to continue for bone strength  Tremors have gotten worse again to where they were pre-gabapentin.  He's struggling to write legibly.  He did not initially react well to the higher dose--says he felt catatonic before.  He felt like his mouth got irritated and almost numb in his head and neck.  It did stop after a couple of months.     He had his first shingrix done.    He was meant to get prevnar 13 last time, but did not.  He will get it today--previously had two pneumovax, it appears.  He put on 12 lbs after the hip injection and now  he's down to baseline preshot.  He's not going to the chair exercise class now.  Not walking as much as he should.  He has decreased his portion sizes.  He is using a small plate.  He will completely fill his plate and eat all of it.  He's not getting hungry b/w.  He will sometimes be hungry due to boredom, not true hunger.  He has reduced his desserts also.   He is eating a lot of veggies and does eat quite a bit of fish.  Is aiming for a close to [employer].   He did eat something before his labs so sugar was 144.   Reviewed lipid panel and other labs which are ok.    Past Medical History:  Diagnosis Date  . Allergic rhinitis   . Arthritis   . Cancer (HCC)    basal cell nose  . Carotid atherosclerosis, bilateral    dvt  ?14  . DVT (deep venous thrombosis) ([facility]) 11/22/2012  . GERD (gastroesophageal reflux disease)    occ  . High blood pressure   . Hypercholesteremia   . Hyperlipidemia   . Left atrial dilatation   . LVH (left ventricular hypertrophy)   . Obesity   .  Seizures ([place])    "in 20's from aspirin"    Past Surgical History:  Procedure Laterality Date  . LUMBAR LAMINECTOMY/DECOMPRESSION MICRODISCECTOMY Left 11/30/2016   Procedure: LEFT LUMBAR FOUR-FIVE LAMINECTOMY FOR FACET/SYNOVIAL CYST;  Surgeon: Ashok Pall, MD;  Location: [facility];  Service: Neurosurgery;  Laterality: Left;  LAMINECTOMY FOR FACET/SYNOVIAL CYST LUMBAR 4-LUMBAR 5, LEFT  . right leg fracture s/p surgical repair with screws Right 1966  . SKIN CANCER EXCISION  2010   from his nose, no recurrence  . TOTAL KNEE ARTHROPLASTY Right 2015    Allergies  Allergen Reactions  . Asa [Aspirin] Other (See Comments)    Seizures     Outpatient Encounter Medications as of 09/19/2017  Medication Sig  . acetaminophen (TYLENOL) 500 MG tablet Take 500 mg by mouth at bedtime as needed for moderate pain.   Marland Kitchen atorvastatin (LIPITOR) 20 MG tablet Take 1 tablet (20 mg total) by mouth daily.  . calcium carbonate (TUMS -  DOSED IN MG ELEMENTAL CALCIUM) 500 MG chewable tablet Chew 1 tablet by mouth as needed for indigestion or heartburn.  . Cholecalciferol (VITAMIN D3) 2000 units TABS Take 2,000 Units by mouth every evening.   . gabapentin (NEURONTIN) 100 MG capsule Take 1 capsule (100 mg total) by mouth at bedtime.  Marland Kitchen losartan-hydrochlorothiazide (HYZAAR) 100-12.5 MG tablet Take 1 tablet by mouth daily.  . naproxen sodium (ALEVE) 220 MG tablet Take 220 mg by mouth daily as needed (pain).   No facility-administered encounter medications on file as of 09/19/2017.     Review of Systems:  Review of Systems  Constitutional: Negative for chills, fever and malaise/fatigue.  Eyes: Negative for blurred vision.       Glasses  Respiratory: Negative for cough and shortness of breath.   Cardiovascular: Positive for chest pain. Negative for palpitations and leg swelling.  Gastrointestinal: Negative for abdominal pain, blood in stool, constipation, heartburn and melena.  Genitourinary: Negative for dysuria.  Musculoskeletal: Positive for joint pain and myalgias. Negative for falls.  Skin: Negative for itching and rash.  Neurological: Positive for tremors. Negative for dizziness.       Tremor worse  Psychiatric/Behavioral: Negative for depression and memory loss. The patient is not nervous/anxious.     Health Maintenance  Topic Date Due  . Hepatitis C Screening  1945/08/12  . PNA vac Low Risk Adult (2 of 2 - PCV13) 01/02/2016  . INFLUENZA VACCINE  11/01/2017  . TETANUS/TDAP  04/04/2023  . COLONOSCOPY  01/31/2024    Physical Exam: Vitals:   09/19/17 0832  BP: 112/60  Pulse: 88  Temp: 98.5 F (36.9 C)  TempSrc: Oral  SpO2: 92%  Weight: 234 lb (106.1 kg)  Height: 5\' 11"  (1.803 m)   Body mass index is 32.64 kg/m. Physical Exam  Constitutional: He is oriented to person, place, and time. He appears well-developed and well-nourished. No distress.  HENT:  Head: Normocephalic and atraumatic.  Eyes:  glasses   Cardiovascular: Normal rate, regular rhythm, normal heart sounds and intact distal pulses.  Pulmonary/Chest: Effort normal and breath sounds normal. No respiratory distress.  Abdominal: Bowel sounds are normal.  Musculoskeletal: Normal range of motion. He exhibits no tenderness.  No tenderness of chest  Neurological: He is alert and oriented to person, place, and time.  Skin: Skin is warm and dry. Capillary refill takes less than 2 seconds.  Psychiatric: He has a normal mood and affect.    Labs reviewed: Basic Metabolic Panel: Recent Labs    11/27/16 [id number]  09/11/17 0800  NA 140 139  K 3.9 4.5  CL 107  --   CO2 24  --   GLUCOSE 108*  --   BUN 13 13  CREATININE 1.18 1.0  CALCIUM 9.4  --    Liver Function Tests: Recent Labs    09/11/17 0800  AST 29  ALT 48*  ALKPHOS 61   No results for input(s): LIPASE, AMYLASE in the last 8760 hours. No results for input(s): AMMONIA in the last 8760 hours. CBC: Recent Labs    11/27/16 0826 09/11/17 0800  WBC 8.8 8.0  HGB 15.6 15.7  HCT 45.3 45  MCV 95.6  --   PLT 243 276   Lipid Panel: Recent Labs    09/11/17 0800  CHOL 153  HDL 34*  LDLCALC 87  TRIG 159   Lab Results  Component Value Date   HGBA1C 5.6 09/12/2016    Assessment/Plan 1. Obesity (BMI 30.0-34.9) -ongoing, is working on decreased portion sizes and sweets--counseled on this and increasing walking and chair fit  2. Muscular chest pain -new when he sits down, last seconds, noncardiac-sounding  3. Pure hypercholesterolemia -LDL at goal for him--cont lipitor therapy -work on diet and exercise  4. Benign essential tremor -worse, back to where it was pre-gabapentin in his opinion and affecting ability to write so will try increased gabapentin as step 1 -if not helpful, next step would be adding primidone or propranolol (in which case I would have to reduce his losartan/hctz dose)  5. Generalized osteoarthritis of multiple sites -he feels his pains are  more muscular now, but he's aware of his OA of joints, uses tylenol at bedtime when needed  6. Hyperglycemia -ongoing, cont to decrease desserts and portions and increase exercise   Labs/tests ordered:  Cbc, cmp, flp, hba1c before Next appt:  6 mos with labs before  Danuta Huseman L. Symphani Eckstrom, D.O. [facility] [address], [place] [address] Cell Phone (Mon-Fri 8am-5pm):  [phone] On Call:  [phone] & follow prompts after 5pm & weekends Office Phone:  [phone] Office Fax:  [phone]

## 2017-09-19 NOTE — Addendum Note (Signed)
Addended by: Despina Hidden on: 09/19/2017 09:27 AM   Modules accepted: Orders

## 2017-09-19 NOTE — Patient Instructions (Addendum)
Let's try gabapentin 200mg  instead of 100mg  to help the tremors.  If you have too many side effects, let me know.    Continue to work on cutting back on desserts and using smaller plates to limit portion sizes, increase veggies and fish.

## 2018-01-02 ENCOUNTER — Encounter: Payer: Self-pay | Admitting: Internal Medicine

## 2018-01-02 DIAGNOSIS — E7800 Pure hypercholesterolemia, unspecified: Secondary | ICD-10-CM

## 2018-01-02 MED ORDER — ATORVASTATIN CALCIUM 20 MG PO TABS: 20.0000 mg | ORAL_TABLET | Freq: Every day | ORAL | 1 refills | Status: DC

## 2018-01-09 DIAGNOSIS — Z23 Encounter for immunization: Secondary | ICD-10-CM | POA: Diagnosis not present

## 2018-01-12 ENCOUNTER — Encounter: Payer: Self-pay | Admitting: Internal Medicine

## 2018-01-12 DIAGNOSIS — I10 Essential (primary) hypertension: Secondary | ICD-10-CM

## 2018-01-14 ENCOUNTER — Other Ambulatory Visit: Payer: Self-pay | Admitting: Internal Medicine

## 2018-01-14 DIAGNOSIS — I10 Essential (primary) hypertension: Secondary | ICD-10-CM

## 2018-01-14 MED ORDER — LOSARTAN POTASSIUM-HCTZ 100-12.5 MG PO TABS
1.0000 | ORAL_TABLET | Freq: Every day | ORAL | 0 refills | Status: DC
Start: 2018-01-14 — End: 2018-01-14

## 2018-02-05 ENCOUNTER — Non-Acute Institutional Stay: Payer: Medicare Other

## 2018-02-05 VITALS — BP 124/68 | HR 100 | Temp 97.5°F | Ht 71.0 in | Wt 241.0 lb

## 2018-02-05 DIAGNOSIS — Z0000 Encounter for general adult medical examination without abnormal findings: Secondary | ICD-10-CM

## 2018-02-05 NOTE — Progress Notes (Signed)
Subjective:   Colton Hart is a 72 y.o. male who presents for Medicare Annual/Subsequent preventive examination at [facility]-01/09/2017  `    Objective:    Vitals: BP 124/68 (BP Location: Left Arm, Patient Position: Sitting)   Pulse 100   Temp (!) 97.5 F (36.4 C) (Oral)   Ht 5\' 11"  (1.803 m)   Wt 241 lb (109.3 kg)   SpO2 94%   BMI 33.61 kg/m   Body mass index is 33.61 kg/m.  Advanced Directives 02/05/2018 09/19/2017 03/21/2017 01/10/2017 01/09/2017 12/01/2016 11/27/2016  Does Patient Have a Medical Advance Directive? Yes Yes Yes Yes Yes Yes Yes  Type of [place] administrator Power of [place] of [place] of [place];Living will [place];Living will  Does patient want to make changes to medical advance directive? No - Patient declined No - Patient declined No - Patient declined - No - Patient declined No - Patient declined No - Patient declined  Copy of [place] in Chart? No - copy requested Yes Yes Yes Yes No - copy requested No - copy requested    Tobacco Social History   Tobacco Use  Smoking Status Former Smoker  . Packs/day: 1.00  . Years: 20.00  . Pack years: 20.00  . Types: Cigarettes  . Last attempt to quit: 11/27/1988  . Years since quitting: 29.2  Smokeless Tobacco Never Used     Counseling given: Not Answered   Clinical Intake:  Pre-visit preparation completed: No  Pain : No/denies pain     Diabetes: No  How often do you need to have someone help you when you read instructions, pamphlets, or other written materials from your doctor or pharmacy?: 1 - Never What is the last grade level you completed in school?: PHD  Interpreter Needed?: No  Information entered by :: Tyson Dense, RN  Past Medical History:  Diagnosis Date  . Allergic rhinitis   .  Arthritis   . Cancer (HCC)    basal cell nose  . Carotid atherosclerosis, bilateral    dvt  ?14  . DVT (deep venous thrombosis) ([facility]) 11/22/2012  . GERD (gastroesophageal reflux disease)    occ  . High blood pressure   . Hypercholesteremia   . Hyperlipidemia   . Left atrial dilatation   . LVH (left ventricular hypertrophy)   . Obesity   . Seizures ([place] [facility])    "in 20's from aspirin"   Past Surgical History:  Procedure Laterality Date  . LUMBAR LAMINECTOMY/DECOMPRESSION MICRODISCECTOMY Left 11/30/2016   Procedure: LEFT LUMBAR FOUR-FIVE LAMINECTOMY FOR FACET/SYNOVIAL CYST;  Surgeon: Ashok Pall, MD;  Location: [facility];  Service: Neurosurgery;  Laterality: Left;  LAMINECTOMY FOR FACET/SYNOVIAL CYST LUMBAR 4-LUMBAR 5, LEFT  . right leg fracture s/p surgical repair with screws Right 1966  . SKIN CANCER EXCISION  2010   from his nose, no recurrence  . TOTAL KNEE ARTHROPLASTY Right 2015   Family History  Problem Relation Age of Onset  . Alzheimer's disease Mother   . Cancer Mother        Skin  . Alzheimer's disease Father   . Cancer Father        colon, liver, skin  . Colon polyps Father   . Colon cancer Father    Social History   Socioeconomic History  . Marital status: Married    Spouse name: Not  on file  . Number of children: Not on file  . Years of education: Not on file  . Highest education level: Not on file  Occupational History  . Not on file  Social Needs  . Financial resource strain: Not hard at all  . Food insecurity:    Worry: Never true    Inability: Never true  . Transportation needs:    Medical: No    Non-medical: No  Tobacco Use  . Smoking status: Former Smoker    Packs/day: 1.00    Years: 20.00    Pack years: 20.00    Types: Cigarettes    Last attempt to quit: 11/27/1988    Years since quitting: 29.2  . Smokeless tobacco: Never Used  Substance and Sexual Activity  . Alcohol use: No  . Drug use: No  . Sexual activity: Not on file  Lifestyle  .  Physical activity:    Days per week: 7 days    Minutes per session: 30 min  . Stress: Only a little  Relationships  . Social connections:    Talks on phone: More than three times a week    Gets together: More than three times a week    Attends religious service: Never    Active member of club or organization: No    Attends meetings of clubs or organizations: Never    Relationship status: Married  Other Topics Concern  . Not on file  Social History Narrative   Diet?        Do you drink/eat things with caffeine? Yes      Marital status?            Married                        What year were you married? 2001      Do you live in a house, apartment, assisted living, condo, trailer, etc.? home      Is it one or more stories? no      How many persons live in your home? 2      Do you have any pets in your home? (please list) no      Current or past profession: IT consultant      Do you exercise?      No                                Type & how often? Walk 1-2 miles, 1-2 times per week      Do you have a living will? No      Do you have a DNR form?    No                              If not, do you want to discuss one? Yes      Do you have signed POA/HPOA for forms?  Yes    Outpatient Encounter Medications as of 02/05/2018  Medication Sig  . acetaminophen (TYLENOL) 500 MG tablet Take 500 mg by mouth at bedtime as needed for moderate pain.   Marland Kitchen atorvastatin (LIPITOR) 20 MG tablet Take 1 tablet (20 mg total) by mouth daily.  . calcium carbonate (TUMS - DOSED IN MG ELEMENTAL CALCIUM) 500 MG chewable tablet Chew 1 tablet by mouth as needed for indigestion or heartburn.  . Cholecalciferol (  VITAMIN D3) 2000 units TABS Take 2,000 Units by mouth every evening.   Marland Kitchen losartan-hydrochlorothiazide (HYZAAR) 100-12.5 MG tablet TAKE 1 TABLET BY MOUTH EVERY DAY  . naproxen sodium (ALEVE) 220 MG tablet Take 220 mg by mouth daily as needed (pain).  Marland Kitchen gabapentin (NEURONTIN) 100 MG capsule Take 2  capsules (200 mg total) by mouth at bedtime. (Patient not taking: Reported on 02/05/2018)   No facility-administered encounter medications on file as of 02/05/2018.     Activities of Daily Living In your present state of health, do you have any difficulty performing the following activities: 02/05/2018  Hearing? N  Vision? N  Difficulty concentrating or making decisions? N  Walking or climbing stairs? N  Dressing or bathing? N  Doing errands, shopping? N  Preparing Food and eating ? N  Using the Toilet? N  In the past six months, have you accidently leaked urine? N  Do you have problems with loss of bowel control? N  Managing your Medications? N  Managing your Finances? N  Housekeeping or managing your Housekeeping? N  Some recent data might be hidden    Patient Care Team: Gayland Curry, DO as PCP - General (Geriatric Medicine)   Assessment:   This is a routine wellness examination for [place].  Exercise Activities and Dietary recommendations Current Exercise Habits: Home exercise routine, Type of exercise: walking, Time (Minutes): 30, Frequency (Times/Week): 7, Weekly Exercise (Minutes/Week): 210, Intensity: Mild, Exercise limited by: None identified  Goals    . Maintain Lifestyle     Starting today pt will maintain lifestyle.        Fall Risk Fall Risk  02/05/2018 09/19/2017 07/05/2017 03/21/2017 01/10/2017  Falls in the past year? 0 No No No No  Number falls in past yr: 0 - - - -  Injury with Fall? 0 - - - -   Is the patient's home free of loose throw rugs in walkways, pet beds, electrical cords, etc?   yes      Grab bars in the bathroom? yes      Handrails on the stairs?   yes      Adequate lighting?   yes  Depression Screen PHQ 2/9 Scores 02/05/2018 09/19/2017 03/21/2017 01/10/2017  PHQ - 2 Score 0 0 0 0    Cognitive Function MMSE - Mini Mental State Exam 02/05/2018 01/09/2017  Orientation to time 5 5  Orientation to Place 5 5  Registration 3 3  Attention/  Calculation 5 5  Recall 3 3  Language- name 2 objects 2 2  Language- repeat 1 1  Language- follow 3 step command 3 3  Language- read & follow direction 1 1  Write a sentence 1 1  Copy design 1 1  Total score 30 30        Immunization History  Administered Date(s) Administered  . Hep A / Hep B 04/03/2002  . Influenza, High Dose Seasonal PF 01/09/2014, 01/07/2015, 01/09/2018  . Influenza-Unspecified 02/18/2016, 01/22/2017  . Pneumococcal Conjugate-13 09/19/2017  . Pneumococcal-Unspecified 09/30/2013, 01/02/2015  . Tdap 04/03/2013  . Zoster Recombinat (Shingrix) 08/18/2017    Qualifies for Shingles Vaccine? Up to date, completed  Screening Tests Health Maintenance  Topic Date Due  . Hepatitis C Screening  27-May-1945  . TETANUS/TDAP  04/04/2023  . COLONOSCOPY  01/31/2024  . INFLUENZA VACCINE  Completed  . PNA vac Low Risk Adult  Completed   Cancer Screenings: Lung: Low Dose CT Chest recommended if Age 44-80 years, 30 pack-year currently  smoking OR have quit w/in 15years. Patient does not qualify. Colorectal: up to date  Additional Screenings:  Hepatitis C Screening: declined      Plan:    I have personally reviewed and addressed the Medicare Annual Wellness questionnaire and have noted the following in the patient's chart:  A. Medical and social history B. Use of alcohol, tobacco or illicit drugs  C. Current medications and supplements D. Functional ability and status E.  Nutritional status F.  Physical activity G. Advance directives H. List of other physicians I.  Hospitalizations, surgeries, and ER visits in previous 12 months J.  [place] to include hearing, vision, cognitive, depression L. Referrals and appointments - none  In addition, I have reviewed and discussed with patient certain preventive protocols, quality metrics, and best practice recommendations. A written personalized care plan for preventive services as well as general preventive  health recommendations were provided to patient.  See attached scanned questionnaire for additional information.   Signed,   Tyson Dense, RN Nurse Health Advisor  Patient Concerns: None

## 2018-02-05 NOTE — Patient Instructions (Addendum)
Colton Hart , Thank you for taking time to come for your Medicare Wellness Visit. I appreciate your ongoing commitment to your health goals. Please review the following plan we discussed and let me know if I can assist you in the future.   Screening recommendations/referrals: Colonoscopy up to date, due 01/31/2024 Recommended yearly ophthalmology/optometry visit for glaucoma screening and checkup Recommended yearly dental visit for hygiene and checkup  Vaccinations: Influenza vaccine up to date Pneumococcal vaccine up to date, completed Tdap vaccine up to date, due 04/04/2023 Shingles vaccine up to date, completed    Advanced directives: In chart  Conditions/risks identified: none  Next appointment: Dr. Mariea Clonts 03/20/2018 @ 8:30am  Preventive Care 65 Years and Older, Male Preventive care refers to lifestyle choices and visits with your health care provider that can promote health and wellness. What does preventive care include?  A yearly physical exam. This is also called an annual well check.  Dental exams once or twice a year.  Routine eye exams. Ask your health care provider how often you should have your eyes checked.  Personal lifestyle choices, including:  Daily care of your teeth and gums.  Regular physical activity.  Eating a healthy diet.  Avoiding tobacco and drug use.  Limiting alcohol use.  Practicing safe sex.  Taking low doses of aspirin every day.  Taking vitamin and mineral supplements as recommended by your health care provider. What happens during an annual well check? The services and screenings done by your health care provider during your annual well check will depend on your age, overall health, lifestyle risk factors, and family history of disease. Counseling  Your health care provider may ask you questions about your:  Alcohol use.  Tobacco use.  Drug use.  Emotional well-being.  Home and relationship well-being.  Sexual  activity.  Eating habits.  History of falls.  Memory and ability to understand (cognition).  Work and work Statistician. Screening  You may have the following tests or measurements:  Height, weight, and BMI.  Blood pressure.  Lipid and cholesterol levels. These may be checked every 5 years, or more frequently if you are over 58 years old.  Skin check.  Lung cancer screening. You may have this screening every year starting at age 82 if you have a 30-pack-year history of smoking and currently smoke or have quit within the past 15 years.  Fecal occult blood test (FOBT) of the stool. You may have this test every year starting at age 74.  Flexible sigmoidoscopy or colonoscopy. You may have a sigmoidoscopy every 5 years or a colonoscopy every 10 years starting at age 33.  Prostate cancer screening. Recommendations will vary depending on your family history and other risks.  Hepatitis C blood test.  Hepatitis B blood test.  Sexually transmitted disease (STD) testing.  Diabetes screening. This is done by checking your blood sugar (glucose) after you have not eaten for a while (fasting). You may have this done every 1-3 years.  Abdominal aortic aneurysm (AAA) screening. You may need this if you are a current or former smoker.  Osteoporosis. You may be screened starting at age 16 if you are at high risk. Talk with your health care provider about your test results, treatment options, and if necessary, the need for more tests. Vaccines  Your health care provider may recommend certain vaccines, such as:  Influenza vaccine. This is recommended every year.  Tetanus, diphtheria, and acellular pertussis (Tdap, Td) vaccine. You may need a Td booster  every 10 years.  Zoster vaccine. You may need this after age 22.  Pneumococcal 13-valent conjugate (PCV13) vaccine. One dose is recommended after age 14.  Pneumococcal polysaccharide (PPSV23) vaccine. One dose is recommended after age  75. Talk to your health care provider about which screenings and vaccines you need and how often you need them. This information is not intended to replace advice given to you by your health care provider. Make sure you discuss any questions you have with your health care provider. Document Released: 04/16/2015 Document Revised: 12/08/2015 Document Reviewed: 01/19/2015 Elsevier Interactive Patient Education  2017 [place] Prevention in the Home Falls can cause injuries. They can happen to people of all ages. There are many things you can do to make your home safe and to help prevent falls. What can I do on the outside of my home?  Regularly fix the edges of walkways and driveways and fix any cracks.  Remove anything that might make you trip as you walk through a door, such as a raised step or threshold.  Trim any bushes or trees on the path to your home.  Use bright outdoor lighting.  Clear any walking paths of anything that might make someone trip, such as rocks or tools.  Regularly check to see if handrails are loose or broken. Make sure that both sides of any steps have handrails.  Any raised decks and porches should have guardrails on the edges.  Have any leaves, snow, or ice cleared regularly.  Use sand or salt on walking paths during winter.  Clean up any spills in your garage right away. This includes oil or grease spills. What can I do in the bathroom?  Use night lights.  Install grab bars by the toilet and in the tub and shower. Do not use towel bars as grab bars.  Use non-skid mats or decals in the tub or shower.  If you need to sit down in the shower, use a plastic, non-slip stool.  Keep the floor dry. Clean up any water that spills on the floor as soon as it happens.  Remove soap buildup in the tub or shower regularly.  Attach bath mats securely with double-sided non-slip rug tape.  Do not have throw rugs and other things on the floor that can make  you trip. What can I do in the bedroom?  Use night lights.  Make sure that you have a light by your bed that is easy to reach.  Do not use any sheets or blankets that are too big for your bed. They should not hang down onto the floor.  Have a firm chair that has side arms. You can use this for support while you get dressed.  Do not have throw rugs and other things on the floor that can make you trip. What can I do in the kitchen?  Clean up any spills right away.  Avoid walking on wet floors.  Keep items that you use a lot in easy-to-reach places.  If you need to reach something above you, use a strong step stool that has a grab bar.  Keep electrical cords out of the way.  Do not use floor polish or wax that makes floors slippery. If you must use wax, use non-skid floor wax.  Do not have throw rugs and other things on the floor that can make you trip. What can I do with my stairs?  Do not leave any items on the stairs.  Make  sure that there are handrails on both sides of the stairs and use them. Fix handrails that are broken or loose. Make sure that handrails are as long as the stairways.  Check any carpeting to make sure that it is firmly attached to the stairs. Fix any carpet that is loose or worn.  Avoid having throw rugs at the top or bottom of the stairs. If you do have throw rugs, attach them to the floor with carpet tape.  Make sure that you have a light switch at the top of the stairs and the bottom of the stairs. If you do not have them, ask someone to add them for you. What else can I do to help prevent falls?  Wear shoes that:  Do not have high heels.  Have rubber bottoms.  Are comfortable and fit you well.  Are closed at the toe. Do not wear sandals.  If you use a stepladder:  Make sure that it is fully opened. Do not climb a closed stepladder.  Make sure that both sides of the stepladder are locked into place.  Ask someone to hold it for you, if  possible.  Clearly mark and make sure that you can see:  Any grab bars or handrails.  First and last steps.  Where the edge of each step is.  Use tools that help you move around (mobility aids) if they are needed. These include:  Canes.  Walkers.  Scooters.  Crutches.  Turn on the lights when you go into a dark area. Replace any light bulbs as soon as they burn out.  Set up your furniture so you have a clear path. Avoid moving your furniture around.  If any of your floors are uneven, fix them.  If there are any pets around you, be aware of where they are.  Review your medicines with your doctor. Some medicines can make you feel dizzy. This can increase your chance of falling. Ask your doctor what other things that you can do to help prevent falls. This information is not intended to replace advice given to you by your health care provider. Make sure you discuss any questions you have with your health care provider. Document Released: 01/14/2009 Document Revised: 08/26/2015 Document Reviewed: 04/24/2014 Elsevier Interactive Patient Education  2017 [employer].

## 2018-03-03 ENCOUNTER — Encounter: Payer: Self-pay | Admitting: Internal Medicine

## 2018-03-12 DIAGNOSIS — D649 Anemia, unspecified: Secondary | ICD-10-CM | POA: Diagnosis not present

## 2018-03-12 DIAGNOSIS — E785 Hyperlipidemia, unspecified: Secondary | ICD-10-CM | POA: Diagnosis not present

## 2018-03-12 DIAGNOSIS — E7800 Pure hypercholesterolemia, unspecified: Secondary | ICD-10-CM | POA: Diagnosis not present

## 2018-03-12 DIAGNOSIS — I10 Essential (primary) hypertension: Secondary | ICD-10-CM | POA: Diagnosis not present

## 2018-03-12 DIAGNOSIS — R739 Hyperglycemia, unspecified: Secondary | ICD-10-CM | POA: Diagnosis not present

## 2018-03-12 DIAGNOSIS — E119 Type 2 diabetes mellitus without complications: Secondary | ICD-10-CM | POA: Diagnosis not present

## 2018-03-12 LAB — HEMOGLOBIN A1C: Hemoglobin A1C: 6.6

## 2018-03-12 LAB — HEPATIC FUNCTION PANEL
ALT: 16 (ref 10–40)
AST: 14 (ref 14–40)
Alkaline Phosphatase: 64 (ref 25–125)
Bilirubin, Total: 0.6

## 2018-03-12 LAB — BASIC METABOLIC PANEL
BUN: 11 (ref 4–21)
Creatinine: 0.9 (ref 0.6–1.3)
Glucose: 141
Potassium: 4.2 (ref 3.4–5.3)
Sodium: 141 (ref 137–147)

## 2018-03-12 LAB — LIPID PANEL
Cholesterol: 132 (ref 0–200)
HDL: 35 (ref 35–70)
LDL Cholesterol: 74
Triglycerides: 118 (ref 40–160)

## 2018-03-12 LAB — CBC AND DIFFERENTIAL
HCT: 43 (ref 41–53)
Hemoglobin: 14.8 (ref 13.5–17.5)
Platelets: 419 — AB (ref 150–399)
WBC: 9.8

## 2018-03-15 ENCOUNTER — Encounter: Payer: Self-pay | Admitting: Internal Medicine

## 2018-03-20 ENCOUNTER — Non-Acute Institutional Stay: Payer: Medicare Other | Admitting: Internal Medicine

## 2018-03-20 ENCOUNTER — Encounter: Payer: Self-pay | Admitting: Internal Medicine

## 2018-03-20 VITALS — BP 142/70 | HR 100 | Temp 98.5°F | Ht 71.0 in | Wt 233.0 lb

## 2018-03-20 DIAGNOSIS — M25512 Pain in left shoulder: Secondary | ICD-10-CM

## 2018-03-20 DIAGNOSIS — M25511 Pain in right shoulder: Secondary | ICD-10-CM

## 2018-03-20 DIAGNOSIS — M1990 Unspecified osteoarthritis, unspecified site: Secondary | ICD-10-CM | POA: Diagnosis not present

## 2018-03-20 DIAGNOSIS — G250 Essential tremor: Secondary | ICD-10-CM | POA: Diagnosis not present

## 2018-03-20 DIAGNOSIS — G8929 Other chronic pain: Secondary | ICD-10-CM

## 2018-03-20 MED ORDER — PROPRANOLOL HCL 10 MG PO TABS: 10.0000 mg | ORAL_TABLET | Freq: Two times a day (BID) | ORAL | 3 refills | Status: DC

## 2018-03-20 NOTE — Progress Notes (Signed)
Location:  Occupational psychologist of Service:  Clinic (12)  Provider: Carisa Backhaus L. Mariea Clonts, D.O., C.M.D.  Code Status: DNR Goals of Care:  Advanced Directives 03/20/2018  Does Patient Have a Medical Advance Directive? Yes  Type of Advance Directive [place]  Does patient want to make changes to medical advance directive? No - Patient declined  Copy of [place] in Chart? Yes - validated most recent copy scanned in chart (See row information)     Chief Complaint  Patient presents with  . Medical Management of Chronic Issues    22mh follow-up    HPI: Patient is a 72y.o. male seen today for medical management of chronic diseases.    Shoulders are really bothering him along the lines of where the one was about a year ago and he had an injection.  If he stands he is fine--no shoulder pain.  If he lies down, his arms start to ache shoulders down.  Has to sit up in the bed, but it starts to happen there and then he has to move to a recliner.  Only sleeping 2-3 hrs at night.  Previously shoulder bothered him when he tried to lie on his side which is his preferred sleep position.    [place]  The back of his right hand was swollen to where he could not close the hands.  Pain also radiates up his arm and his wrist also hurts and feels stress if he puts his palm down with pressure.  It feels like someone is sticking a pin into his wrist and knuckles.  No neck trouble.  If he sits long enough, his forearms start to ache.    Opening the door, trying to dress, etc. really hurts the hands.    He walked over here and pulse and bp was way up.  Typically bp in 120s.    He admits he got really bad at skipping desserts.  He thinks he could work at the hba1c.  It's up in the diabetic range.  He was actually fasting.  He didn't care anymore for a while due to his significant pain.    Platelets were elevated on  labs.  ALT is normal this time when previously a bit high.  Gabapentin--was using more for tremors.  These have been getting worse.  He'd doubled the dose which didn't help, but made him a zombie.  That makes him depressed. Tremors do improve if he does not have his coffee.  He does not drink alcohol at all.    Past Medical History:  Diagnosis Date  . Allergic rhinitis   . Arthritis   . Cancer (HCC)    basal cell nose  . Carotid atherosclerosis, bilateral    dvt  ?14  . [place] [place]  . GERD (gastroesophageal reflux disease)    occ  . High blood pressure   . Hypercholesteremia   . Hyperlipidemia   . Left atrial dilatation   . LVH (left ventricular hypertrophy)   . Obesity   . [place]    "in 20's from aspirin"    Past Surgical History:  Procedure Laterality Date  . LUMBAR LAMINECTOMY/DECOMPRESSION MICRODISCECTOMY Left 11/30/2016   Procedure: LEFT LUMBAR FOUR-FIVE LAMINECTOMY FOR FACET/SYNOVIAL CYST;  Surgeon: CAshok Pall MD;  [place] [facility]  Service: Neurosurgery;  Laterality: Left;  LAMINECTOMY FOR FACET/SYNOVIAL CYST LUMBAR 4-LUMBAR 5, LEFT  . right  leg fracture s/p surgical repair with screws Right 1966  . SKIN CANCER EXCISION  2010   from his nose, no recurrence  . TOTAL KNEE ARTHROPLASTY Right 2015    Allergies  Allergen Reactions  . Asa [Aspirin] Other (See Comments)    Seizures     Outpatient Encounter Medications as of 03/20/2018  Medication Sig  . acetaminophen (TYLENOL) 500 MG tablet Take 500 mg by mouth at bedtime as needed for moderate pain.   Marland Kitchen atorvastatin (LIPITOR) 20 MG tablet Take 1 tablet (20 mg total) by mouth daily.  . calcium carbonate (TUMS - DOSED IN MG ELEMENTAL CALCIUM) 500 MG chewable tablet Chew 1 tablet by mouth as needed for indigestion or heartburn.  . Cholecalciferol (VITAMIN D3) 2000 units TABS Take 2,000 Units by mouth every evening.   Marland Kitchen losartan-hydrochlorothiazide (HYZAAR) 100-12.5 MG tablet  TAKE 1 TABLET BY MOUTH EVERY DAY  . naproxen sodium (ALEVE) 220 MG tablet Take 220 mg by mouth daily as needed (pain).  . propranolol (INDERAL) 10 MG tablet Take 1 tablet (10 mg total) by mouth 2 (two) times daily.  . [DISCONTINUED] gabapentin (NEURONTIN) 100 MG capsule Take 2 capsules (200 mg total) by mouth at bedtime. (Patient not taking: Reported on 02/05/2018)   No facility-administered encounter medications on file as of 03/20/2018.     Review of Systems:  Review of Systems  Constitutional: Negative for chills and fever.  HENT: Positive for hearing loss. Negative for congestion.   Eyes: Negative for blurred vision.       Glasses  Respiratory: Negative for cough, sputum production and shortness of breath.   Cardiovascular: Negative for chest pain, palpitations and leg swelling.  Gastrointestinal: Negative for abdominal pain, blood in stool, constipation and diarrhea.  Genitourinary: Negative for dysuria.  Musculoskeletal: Negative for back pain.  Skin: Negative for rash.  Neurological: Positive for tingling and sensory change. Negative for loss of consciousness and weakness.  Psychiatric/Behavioral: Positive for depression. Negative for memory loss. The patient is not nervous/anxious and does not have insomnia.     Health Maintenance  Topic Date Due  . Hepatitis C Screening  1945-05-12  . TETANUS/TDAP  04/04/2023  . COLONOSCOPY  01/31/2024  . INFLUENZA VACCINE  Completed  . PNA vac Low Risk Adult  Completed    Physical Exam: Vitals:   03/20/18 0827 03/20/18 0914  BP: (!) 158/70 (!) 142/70  Pulse: 100   Temp: 98.5 F (36.9 C)   TempSrc: Oral   SpO2: 95%   Weight: 233 lb (105.7 kg)   Height: '5\' 11"'$  (1.803 m)    Body mass index is 32.5 kg/m. Physical Exam Constitutional:      Appearance: Normal appearance.  Eyes:     Comments: glasses  Neck:     Musculoskeletal: Normal range of motion and neck supple.  Cardiovascular:     Rate and Rhythm: Regular rhythm.  Tachycardia present.     Pulses: Normal pulses.     Heart sounds: Normal heart sounds.  Pulmonary:     Effort: Pulmonary effort is normal.     Breath sounds: Normal breath sounds.  Abdominal:     Comments: obese  Musculoskeletal:     Comments: Decreased active abduction of both shoulders right limited more than left; also limited flexion  Skin:    General: Skin is warm and dry.     Capillary Refill: Capillary refill takes less than 2 seconds.  Neurological:     General: No focal deficit present.  Mental Status: He is alert and oriented to person, place, and time.     Comments: Tremor with activity, right hand worse than left  Psychiatric:        Mood and Affect: Mood normal.        Behavior: Behavior normal.        Thought Content: Thought content normal.        Judgment: Judgment normal.     Labs reviewed: Basic Metabolic Panel: Recent Labs    09/11/17 03/12/18 0500  NA 139 141  K 4.5 4.2  BUN 13 11  CREATININE 1.0 0.9   Liver Function Tests: Recent Labs    09/11/17 03/12/18 0500  AST 29 14  ALT 48* 16  ALKPHOS 61 64   No results for input(s): LIPASE, AMYLASE in the last 8760 hours. No results for input(s): AMMONIA in the last 8760 hours. CBC: Recent Labs    09/11/17 03/12/18 0500  WBC 8.0 9.8  HGB 15.7 14.8  HCT 45 43  PLT 276 419*   Lipid Panel: Recent Labs    09/11/17 03/12/18 0500  CHOL 153 132  HDL 34* 35  LDLCALC 87 74  TRIG 159 118   Lab Results  Component Value Date   HGBA1C 6.6 03/12/2018   Reviewed old MRI of left shoulde  Assessment/Plan 1. 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 Benign essential tremor -worse lately -no longer tolerating gabapentin -start propranolol low dose and see if helps and may also lower bp which was high today for him - propranolol (INDERAL) 10 MG tablet; Take 1 tablet (10 mg total) by mouth 2 (two) times daily.  Dispense: 60  tablet; Refill: 3  3. Chronic pain of both shoulders -seems he has tendinopathy/rotator cuff tendinitis primarily at this point; had prior MRI left, but now right worse than left -benefited from shoulder injection left by Dr. Erlinda Hong before - Ambulatory referral to Orthopedic Surgery -check basic xrays bilateral shoulders at [facility]  Labs/tests ordered:  ESR, CRP in am; bilateral shoulder xrays at his convenience  Manvir Prabhu L. Avo Schlachter, D.O. [facility] [address], [place] [address] Cell Phone (Mon-Fri 8am-5pm):  [phone] On Call:  [phone] & follow prompts after 5pm & weekends Office Phone:  [phone] Office Fax:  [phone]

## 2018-03-21 ENCOUNTER — Ambulatory Visit
Admission: RE | Admit: 2018-03-21 | Discharge: 2018-03-21 | Disposition: A | Discharge status: Home / Self Care | Payer: Medicare Other | Source: Ambulatory Visit | Attending: Internal Medicine | Admitting: Internal Medicine

## 2018-03-21 DIAGNOSIS — M2550 Pain in unspecified joint: Secondary | ICD-10-CM | POA: Diagnosis not present

## 2018-03-21 DIAGNOSIS — M25511 Pain in right shoulder: Principal | ICD-10-CM

## 2018-03-21 DIAGNOSIS — G8929 Other chronic pain: Secondary | ICD-10-CM

## 2018-03-21 DIAGNOSIS — M1990 Unspecified osteoarthritis, unspecified site: Secondary | ICD-10-CM | POA: Diagnosis not present

## 2018-03-21 DIAGNOSIS — M25512 Pain in left shoulder: Principal | ICD-10-CM

## 2018-03-21 DIAGNOSIS — M19012 Primary osteoarthritis, left shoulder: Secondary | ICD-10-CM | POA: Diagnosis not present

## 2018-03-28 ENCOUNTER — Ambulatory Visit (INDEPENDENT_AMBULATORY_CARE_PROVIDER_SITE_OTHER): Payer: Medicare Other | Admitting: Orthopaedic Surgery

## 2018-03-28 DIAGNOSIS — M7532 Calcific tendinitis of left shoulder: Secondary | ICD-10-CM | POA: Diagnosis not present

## 2018-03-28 DIAGNOSIS — M7531 Calcific tendinitis of right shoulder: Secondary | ICD-10-CM

## 2018-03-28 MED ORDER — METHYLPREDNISOLONE ACETATE 40 MG/ML IJ SUSP
40.0000 mg | INTRAMUSCULAR | Status: AC | PRN
  Administered 2018-03-28: 40 mg via INTRA_ARTICULAR

## 2018-03-28 MED ORDER — BUPIVACAINE HCL 0.5 % IJ SOLN
3.0000 mL | INTRAMUSCULAR | Status: AC | PRN
  Administered 2018-03-28: 3 mL via INTRA_ARTICULAR

## 2018-03-28 MED ORDER — LIDOCAINE HCL 1 % IJ SOLN
3.0000 mL | INTRAMUSCULAR | Status: AC | PRN
  Administered 2018-03-28: 3 mL

## 2018-03-28 NOTE — Progress Notes (Signed)
Office Visit Note   Patient: Colton Hart           Date of Birth: [birth year]           MRN: [id number] Visit Date: 03/28/2018              Requested by: Gayland Curry, DO [place], [place] [address] PCP: Gayland Curry, DO   Assessment & Plan: Visit Diagnoses:  1. Calcific tendinitis of both shoulders     Plan: Impression is right greater than left calcific tendinitis.  Bilateral subacromial injections performed today.  Patient tolerated this well.  Follow-up as needed.  Follow-Up Instructions: Return if symptoms worsen or fail to improve.   Orders:  No orders of the defined types were placed in this encounter.  No orders of the defined types were placed in this encounter.     Procedures: Large Joint Inj: bilateral subacromial bursa on 03/28/2018 9:23 AM Indications: pain Details: 22 G needle Medications (Right): 3 mL lidocaine 1 %; 3 mL bupivacaine 0.5 %; 40 mg methylPREDNISolone acetate 40 MG/ML Medications (Left): 3 mL lidocaine 1 %; 3 mL bupivacaine 0.5 %; 40 mg methylPREDNISolone acetate 40 MG/ML Outcome: tolerated well, no immediate complications Patient was prepped and draped in the usual sterile fashion.       Clinical Data: No additional findings.   Subjective: Chief Complaint  Patient presents with  . Right Shoulder - Pain  . Left Shoulder - Pain    Colton Hart is here for bilateral shoulder pain right greater than left.  I saw him about a year ago for left shoulder pain and calcific tendinitis which significantly improved from a shoulder injection.  He states that he has been having increasing pain in the last several months in his shoulders.  He would like to try another injection.  He denies any injuries.  Denies any numbness and tingling.   Review of Systems  Constitutional: Negative.   All other systems reviewed and are negative.    Objective: Vital Signs: There were no vitals taken for this visit.  Physical Exam Vitals signs  and nursing note reviewed.  Constitutional:      Appearance: He is well-developed.  Pulmonary:     Effort: Pulmonary effort is normal.  Abdominal:     Palpations: Abdomen is soft.  Skin:    General: Skin is warm.  Neurological:     Mental Status: He is alert and oriented to person, place, and time.  Psychiatric:        Behavior: Behavior normal.        Thought Content: Thought content normal.        Judgment: Judgment normal.     Ortho Exam Bilateral shoulder exam shows pain with empty can testing.  Muscle strength is normal to manual testing.  Positive impingement sign. Specialty Comments:  No specialty comments available.  Imaging: No results found.   PMFS History: Patient Active Problem List   Diagnosis Date Noted  . Tendinopathy of left rotator cuff 01/26/2017  . Impingement syndrome of left shoulder 01/26/2017  . Primary osteoarthritis, left shoulder 01/26/2017  . HNP (herniated nucleus pulposus), lumbar 11/30/2016  . Obesity (BMI 30.0-34.9) 06/21/2016  . Varicose vein of leg 06/21/2016  . Chronic left-sided low back pain with left-sided sciatica 03/22/2016  . Chronic hip pain, left 03/22/2016  . History of colonic polyps 03/22/2016  . Essential hypertension, benign 03/22/2016  . Pure hypercholesterolemia 03/22/2016  . Benign essential tremor 03/22/2016  .  Hyperglycemia 03/22/2016   Past Medical History:  Diagnosis Date  . Allergic rhinitis   . Arthritis   . Cancer (HCC)    basal cell nose  . Carotid atherosclerosis, bilateral    dvt  ?14  . DVT (deep venous thrombosis) ([place]) 11/22/2012  . GERD (gastroesophageal reflux disease)    occ  . High blood pressure   . Hypercholesteremia   . Hyperlipidemia   . Left atrial dilatation   . LVH (left ventricular hypertrophy)   . Obesity   . Seizures ([place])    "in 20's from aspirin"    Family History  Problem Relation Age of Onset  . Alzheimer's disease Mother   . Cancer Mother        Skin  . Alzheimer's  disease Father   . Cancer Father        colon, liver, skin  . Colon polyps Father   . Colon cancer Father     Past Surgical History:  Procedure Laterality Date  . LUMBAR LAMINECTOMY/DECOMPRESSION MICRODISCECTOMY Left 11/30/2016   Procedure: LEFT LUMBAR FOUR-FIVE LAMINECTOMY FOR FACET/SYNOVIAL CYST;  Surgeon: Ashok Pall, MD;  Location: [facility];  Service: Neurosurgery;  Laterality: Left;  LAMINECTOMY FOR FACET/SYNOVIAL CYST LUMBAR 4-LUMBAR 5, LEFT  . right leg fracture s/p surgical repair with screws Right 1966  . SKIN CANCER EXCISION  2010   from his nose, no recurrence  . TOTAL KNEE ARTHROPLASTY Right 2015   Social History   Occupational History  . Not on file  Tobacco Use  . Smoking status: Former Smoker    Packs/day: 1.00    Years: 20.00    Pack years: 20.00    Types: Cigarettes    Last attempt to quit: 11/27/1988    Years since quitting: 29.3  . Smokeless tobacco: Never Used  Substance and Sexual Activity  . Alcohol use: No  . Drug use: No  . Sexual activity: Not on file

## 2018-04-05 DIAGNOSIS — M25511 Pain in right shoulder: Secondary | ICD-10-CM | POA: Diagnosis not present

## 2018-04-05 DIAGNOSIS — M7530 Calcific tendinitis of unspecified shoulder: Secondary | ICD-10-CM | POA: Diagnosis not present

## 2018-04-05 DIAGNOSIS — M25612 Stiffness of left shoulder, not elsewhere classified: Secondary | ICD-10-CM | POA: Diagnosis not present

## 2018-04-05 DIAGNOSIS — M25611 Stiffness of right shoulder, not elsewhere classified: Secondary | ICD-10-CM | POA: Diagnosis not present

## 2018-04-09 DIAGNOSIS — M7530 Calcific tendinitis of unspecified shoulder: Secondary | ICD-10-CM | POA: Diagnosis not present

## 2018-04-09 DIAGNOSIS — M25511 Pain in right shoulder: Secondary | ICD-10-CM | POA: Diagnosis not present

## 2018-04-09 DIAGNOSIS — M25611 Stiffness of right shoulder, not elsewhere classified: Secondary | ICD-10-CM | POA: Diagnosis not present

## 2018-04-09 DIAGNOSIS — M25612 Stiffness of left shoulder, not elsewhere classified: Secondary | ICD-10-CM | POA: Diagnosis not present

## 2018-04-11 DIAGNOSIS — M7530 Calcific tendinitis of unspecified shoulder: Secondary | ICD-10-CM | POA: Diagnosis not present

## 2018-04-11 DIAGNOSIS — M25611 Stiffness of right shoulder, not elsewhere classified: Secondary | ICD-10-CM | POA: Diagnosis not present

## 2018-04-11 DIAGNOSIS — M25511 Pain in right shoulder: Secondary | ICD-10-CM | POA: Diagnosis not present

## 2018-04-11 DIAGNOSIS — M25612 Stiffness of left shoulder, not elsewhere classified: Secondary | ICD-10-CM | POA: Diagnosis not present

## 2018-04-16 DIAGNOSIS — M7530 Calcific tendinitis of unspecified shoulder: Secondary | ICD-10-CM | POA: Diagnosis not present

## 2018-04-16 DIAGNOSIS — M25612 Stiffness of left shoulder, not elsewhere classified: Secondary | ICD-10-CM | POA: Diagnosis not present

## 2018-04-16 DIAGNOSIS — M25511 Pain in right shoulder: Secondary | ICD-10-CM | POA: Diagnosis not present

## 2018-04-16 DIAGNOSIS — M25611 Stiffness of right shoulder, not elsewhere classified: Secondary | ICD-10-CM | POA: Diagnosis not present

## 2018-04-18 DIAGNOSIS — M7530 Calcific tendinitis of unspecified shoulder: Secondary | ICD-10-CM | POA: Diagnosis not present

## 2018-04-18 DIAGNOSIS — M25611 Stiffness of right shoulder, not elsewhere classified: Secondary | ICD-10-CM | POA: Diagnosis not present

## 2018-04-18 DIAGNOSIS — M25612 Stiffness of left shoulder, not elsewhere classified: Secondary | ICD-10-CM | POA: Diagnosis not present

## 2018-04-18 DIAGNOSIS — M25511 Pain in right shoulder: Secondary | ICD-10-CM | POA: Diagnosis not present

## 2018-04-23 DIAGNOSIS — M25612 Stiffness of left shoulder, not elsewhere classified: Secondary | ICD-10-CM | POA: Diagnosis not present

## 2018-04-23 DIAGNOSIS — M25611 Stiffness of right shoulder, not elsewhere classified: Secondary | ICD-10-CM | POA: Diagnosis not present

## 2018-04-23 DIAGNOSIS — M25511 Pain in right shoulder: Secondary | ICD-10-CM | POA: Diagnosis not present

## 2018-04-23 DIAGNOSIS — M7530 Calcific tendinitis of unspecified shoulder: Secondary | ICD-10-CM | POA: Diagnosis not present

## 2018-04-25 DIAGNOSIS — M25611 Stiffness of right shoulder, not elsewhere classified: Secondary | ICD-10-CM | POA: Diagnosis not present

## 2018-04-25 DIAGNOSIS — M25612 Stiffness of left shoulder, not elsewhere classified: Secondary | ICD-10-CM | POA: Diagnosis not present

## 2018-04-25 DIAGNOSIS — M7530 Calcific tendinitis of unspecified shoulder: Secondary | ICD-10-CM | POA: Diagnosis not present

## 2018-04-25 DIAGNOSIS — M25511 Pain in right shoulder: Secondary | ICD-10-CM | POA: Diagnosis not present

## 2018-04-30 DIAGNOSIS — M25611 Stiffness of right shoulder, not elsewhere classified: Secondary | ICD-10-CM | POA: Diagnosis not present

## 2018-04-30 DIAGNOSIS — M25612 Stiffness of left shoulder, not elsewhere classified: Secondary | ICD-10-CM | POA: Diagnosis not present

## 2018-04-30 DIAGNOSIS — M7530 Calcific tendinitis of unspecified shoulder: Secondary | ICD-10-CM | POA: Diagnosis not present

## 2018-04-30 DIAGNOSIS — M25511 Pain in right shoulder: Secondary | ICD-10-CM | POA: Diagnosis not present

## 2018-05-01 ENCOUNTER — Non-Acute Institutional Stay: Payer: Medicare Other | Admitting: Internal Medicine

## 2018-05-01 ENCOUNTER — Encounter: Payer: Self-pay | Admitting: Internal Medicine

## 2018-05-01 VITALS — BP 110/60 | HR 90 | Temp 98.3°F | Ht 71.0 in | Wt 227.0 lb

## 2018-05-01 DIAGNOSIS — G8929 Other chronic pain: Secondary | ICD-10-CM

## 2018-05-01 DIAGNOSIS — I10 Essential (primary) hypertension: Secondary | ICD-10-CM

## 2018-05-01 DIAGNOSIS — M25511 Pain in right shoulder: Secondary | ICD-10-CM | POA: Diagnosis not present

## 2018-05-01 DIAGNOSIS — M25512 Pain in left shoulder: Secondary | ICD-10-CM | POA: Diagnosis not present

## 2018-05-01 DIAGNOSIS — M19042 Primary osteoarthritis, left hand: Secondary | ICD-10-CM | POA: Diagnosis not present

## 2018-05-01 DIAGNOSIS — M19041 Primary osteoarthritis, right hand: Secondary | ICD-10-CM | POA: Diagnosis not present

## 2018-05-01 MED ORDER — LOSARTAN POTASSIUM-HCTZ 100-12.5 MG PO TABS: 1.0000 | ORAL_TABLET | Freq: Every day | ORAL | 3 refills | Status: DC

## 2018-05-01 NOTE — Progress Notes (Signed)
Location:  Occupational psychologist of Service:  Clinic (12)  Provider: Wilber Fini L. Mariea Clonts, D.O., C.M.D.  Code Status: DNR Goals of Care:  Advanced Directives 03/20/2018  Does Colton Hart Have a Medical Advance Directive? Yes  Type of Advance Directive [place]  Does Colton Hart want to make changes to medical advance directive? No - Colton Hart declined  Copy of [place] in Chart? Yes - validated most recent copy scanned in chart (See row information)     Chief Complaint  Colton Hart presents with  . Medical Management of Chronic Issues    6 week follow-up    HPI: Colton Hart is a 73 y.o. male seen today for medical management of chronic diseases.    Propranolol is working well.  Tremor is much better.  Colton Hart is able to write legibly and hold small items again.    Went to Dr. Erlinda Hong.  Got shots in both shoulders. They worked [employer]. Colton Hart also gave a Rx for PT at [facility].  Stretching tendons and working on strength.  It's a 10 wk duration.  Still pulls on right shoulder when Colton Hart reaches.    BPs are doing great in lower 100s.  No dizziness.  Colton Hart recorded his bps and we reviewed them.  Colton Hart still has pain in his hands and it hurts to grab things sometimes--it's in his knuckles.  Swelling went down and all.  Uses aleve as needed--very rarely takes it.  Hasn't even been using tylenol since the cortisone shots in his shoulders.  ESR was only mildly elevated--30 and Colton Hart is 73 yo so this is not outside of what would be expected.     Past Medical History:  Diagnosis Date  . Allergic rhinitis   . Arthritis   . Cancer (HCC)    basal cell nose  . Carotid atherosclerosis, bilateral    dvt  ?14  . DVT (deep venous thrombosis) ([place]) 11/22/2012  . GERD (gastroesophageal reflux disease)    occ  . High blood pressure   . Hypercholesteremia   . Hyperlipidemia   . Left atrial dilatation   . LVH (left ventricular hypertrophy)   . Obesity   .  Seizures ([place])    "in 20's from aspirin"    Past Surgical History:  Procedure Laterality Date  . LUMBAR LAMINECTOMY/DECOMPRESSION MICRODISCECTOMY Left 11/30/2016   Procedure: LEFT LUMBAR FOUR-FIVE LAMINECTOMY FOR FACET/SYNOVIAL CYST;  Surgeon: Ashok Pall, MD;  Location: [facility];  Service: Neurosurgery;  Laterality: Left;  LAMINECTOMY FOR FACET/SYNOVIAL CYST LUMBAR 4-LUMBAR 5, LEFT  . right leg fracture s/p surgical repair with screws Right 1966  . SKIN CANCER EXCISION  2010   from his nose, no recurrence  . TOTAL KNEE ARTHROPLASTY Right 2015    Allergies  Allergen Reactions  . Asa [Aspirin] Other (See Comments)    Seizures     Outpatient Encounter Medications as of 05/01/2018  Medication Sig  . acetaminophen (TYLENOL) 500 MG tablet Take 500 mg by mouth at bedtime as needed for moderate pain.   Marland Kitchen atorvastatin (LIPITOR) 20 MG tablet Take 1 tablet (20 mg total) by mouth daily.  . calcium carbonate (TUMS - DOSED IN MG ELEMENTAL CALCIUM) 500 MG chewable tablet Chew 1 tablet by mouth as needed for indigestion or heartburn.  . Cholecalciferol (VITAMIN D3) 2000 units TABS Take 2,000 Units by mouth every evening.   Marland Kitchen losartan-hydrochlorothiazide (HYZAAR) 100-12.5 MG tablet TAKE 1 TABLET BY MOUTH EVERY DAY  . naproxen sodium (  ALEVE) 220 MG tablet Take 220 mg by mouth daily as needed (pain).  . propranolol (INDERAL) 10 MG tablet Take 1 tablet (10 mg total) by mouth 2 (two) times daily.   No facility-administered encounter medications on file as of 05/01/2018.     Review of Systems:  Review of Systems  Constitutional: Negative for fever and malaise/fatigue.  HENT: Negative for congestion.   Eyes: Negative for blurred vision.       Glasses  Respiratory: Negative for cough and shortness of breath.   Cardiovascular: Negative for chest pain, palpitations and leg swelling.  Gastrointestinal: Negative for abdominal pain.  Genitourinary: Negative for dysuria.  Musculoskeletal: Positive for  joint pain (in finger bilaterally, right shoulder still bothersome when Colton Hart flexes it or internally rotates the right arm) and myalgias. Negative for back pain, falls and neck pain.       No inflammation of finger joints now, no erythema or warmth  Skin: Negative for rash.  Neurological: Positive for tremors.       Essential tremor much improved with propranolol  Endo/Heme/Allergies: Does not bruise/bleed easily.  Psychiatric/Behavioral: Negative for depression and memory loss. The Colton Hart is not nervous/anxious and does not have insomnia.     Health Maintenance  Topic Date Due  . Hepatitis C Screening  07-09-1945  . TETANUS/TDAP  04/04/2023  . COLONOSCOPY  01/31/2024  . INFLUENZA VACCINE  Completed  . PNA vac Low Risk Adult  Completed    Physical Exam: Vitals:   05/01/18 0834  BP: 110/60  Pulse: 90  Temp: 98.3 F (36.8 C)  TempSrc: Oral  SpO2: 95%  Weight: 227 lb (103 kg)  Height: _0  (1.803 m)   Body mass index is 31.66 kg/m. Physical Exam Constitutional:      Appearance: Normal appearance.  HENT:     Head: Normocephalic and atraumatic.  Eyes:     Comments: glasses  Cardiovascular:     Rate and Rhythm: Normal rate and regular rhythm.     Pulses: Normal pulses.     Heart sounds: Normal heart sounds.     Comments: HR slightly elevated which seems to be chronic for him Pulmonary:     Effort: Pulmonary effort is normal.     Breath sounds: Normal breath sounds.  Musculoskeletal: Normal range of motion.        General: No swelling, tenderness or deformity.  Skin:    General: Skin is warm and dry.  Neurological:     General: No focal deficit present.     Mental Status: Colton Hart is alert and oriented to person, place, and time.     Sensory: No sensory deficit.     Motor: No weakness.     Coordination: Coordination normal.     Comments: Mild fast tremor right greater than left UE  Psychiatric:        Mood and Affect: Mood normal.        Behavior: Behavior normal.          Thought Content: Thought content normal.        Judgment: Judgment normal.     Labs reviewed: Basic Metabolic Panel: Recent Labs    09/11/17 03/12/18  NA 139 141  K 4.5 4.2  BUN 13 11  CREATININE 1.0 0.9   Liver Function Tests: Recent Labs    09/11/17 03/12/18 0500  AST 29 14  ALT 48* 16  ALKPHOS 61 64   No results for input(s): LIPASE, AMYLASE in the last 8760 hours.  No results for input(s): AMMONIA in the last 8760 hours. CBC: Recent Labs    09/11/17 03/12/18  WBC 8.0 9.8  HGB 15.7 14.8  HCT 45 43  PLT 276 419*   Lipid Panel: Recent Labs    09/11/17 03/12/18  CHOL 153 132  HDL 34* 35  LDLCALC 87 74  TRIG 159 118   Lab Results  Component Value Date   HGBA1C 6.6 03/12/2018   Assessment/Plan 1. Essential hypertension, benign - cont current bp regimen--has tolerated well w/o dizziness - losartan-hydrochlorothiazide (HYZAAR) 100-12.5 MG tablet; Take 1 tablet by mouth daily.  Dispense: 90 tablet; Refill: 3  2. Chronic pain of both shoulders -right was newly worsened--got b/l cortisone injections with Dr. Erlinda Hong with great relief  3. Primary osteoarthritis of both hands -ESR only mildly elevated, but did have some visible inflammation last visit, better since b/l cortisone injections in shoulders EXCEPT persistent joint pain at PIPs and DIPs especially with gripping items  -recommended aspercreme otc to the affected joints and to get some exercises from PT about this, too, to keep joints lubricated -call back if pain persists with aspercreme, may consider voltaren then -if inflammation returns, will refer to rheumatology  Labs/tests ordered:  No orders of the defined types were placed in this encounter.  Next appt:  07/31/2018 med mgt  Arvilla Salada L. Rola Lennon, D.O. [facility] [address], [place] [address] Cell Phone (Mon-Fri 8am-5pm):  [phone] On Call:  [phone] & follow prompts after 5pm &  weekends Office Phone:  [phone] Office Fax:  [phone]

## 2018-05-01 NOTE — Patient Instructions (Signed)
Please use an otc topical like aspercreme on your finger joints.  Be careful not to rub your eyes or touch food after applying it or wash your fingertips before doing those things.    If you are not getting relief with this cream, call me back and we can try voltaren gel.

## 2018-05-02 DIAGNOSIS — M7530 Calcific tendinitis of unspecified shoulder: Secondary | ICD-10-CM | POA: Diagnosis not present

## 2018-05-02 DIAGNOSIS — M25511 Pain in right shoulder: Secondary | ICD-10-CM | POA: Diagnosis not present

## 2018-05-02 DIAGNOSIS — M25611 Stiffness of right shoulder, not elsewhere classified: Secondary | ICD-10-CM | POA: Diagnosis not present

## 2018-05-02 DIAGNOSIS — M25612 Stiffness of left shoulder, not elsewhere classified: Secondary | ICD-10-CM | POA: Diagnosis not present

## 2018-05-07 DIAGNOSIS — M25611 Stiffness of right shoulder, not elsewhere classified: Secondary | ICD-10-CM | POA: Diagnosis not present

## 2018-05-07 DIAGNOSIS — M25511 Pain in right shoulder: Secondary | ICD-10-CM | POA: Diagnosis not present

## 2018-05-07 DIAGNOSIS — M25612 Stiffness of left shoulder, not elsewhere classified: Secondary | ICD-10-CM | POA: Diagnosis not present

## 2018-05-07 DIAGNOSIS — M7530 Calcific tendinitis of unspecified shoulder: Secondary | ICD-10-CM | POA: Diagnosis not present

## 2018-05-09 DIAGNOSIS — M25612 Stiffness of left shoulder, not elsewhere classified: Secondary | ICD-10-CM | POA: Diagnosis not present

## 2018-05-09 DIAGNOSIS — M25511 Pain in right shoulder: Secondary | ICD-10-CM | POA: Diagnosis not present

## 2018-05-09 DIAGNOSIS — M7530 Calcific tendinitis of unspecified shoulder: Secondary | ICD-10-CM | POA: Diagnosis not present

## 2018-05-09 DIAGNOSIS — M25611 Stiffness of right shoulder, not elsewhere classified: Secondary | ICD-10-CM | POA: Diagnosis not present

## 2018-05-14 DIAGNOSIS — M25611 Stiffness of right shoulder, not elsewhere classified: Secondary | ICD-10-CM | POA: Diagnosis not present

## 2018-05-14 DIAGNOSIS — M25511 Pain in right shoulder: Secondary | ICD-10-CM | POA: Diagnosis not present

## 2018-05-14 DIAGNOSIS — M7530 Calcific tendinitis of unspecified shoulder: Secondary | ICD-10-CM | POA: Diagnosis not present

## 2018-05-14 DIAGNOSIS — M25612 Stiffness of left shoulder, not elsewhere classified: Secondary | ICD-10-CM | POA: Diagnosis not present

## 2018-05-16 DIAGNOSIS — M7530 Calcific tendinitis of unspecified shoulder: Secondary | ICD-10-CM | POA: Diagnosis not present

## 2018-05-16 DIAGNOSIS — M25611 Stiffness of right shoulder, not elsewhere classified: Secondary | ICD-10-CM | POA: Diagnosis not present

## 2018-05-16 DIAGNOSIS — M25612 Stiffness of left shoulder, not elsewhere classified: Secondary | ICD-10-CM | POA: Diagnosis not present

## 2018-05-16 DIAGNOSIS — M25511 Pain in right shoulder: Secondary | ICD-10-CM | POA: Diagnosis not present

## 2018-05-21 DIAGNOSIS — M25611 Stiffness of right shoulder, not elsewhere classified: Secondary | ICD-10-CM | POA: Diagnosis not present

## 2018-05-21 DIAGNOSIS — M25612 Stiffness of left shoulder, not elsewhere classified: Secondary | ICD-10-CM | POA: Diagnosis not present

## 2018-05-21 DIAGNOSIS — M7530 Calcific tendinitis of unspecified shoulder: Secondary | ICD-10-CM | POA: Diagnosis not present

## 2018-05-21 DIAGNOSIS — M25511 Pain in right shoulder: Secondary | ICD-10-CM | POA: Diagnosis not present

## 2018-05-23 DIAGNOSIS — M25612 Stiffness of left shoulder, not elsewhere classified: Secondary | ICD-10-CM | POA: Diagnosis not present

## 2018-05-23 DIAGNOSIS — M25511 Pain in right shoulder: Secondary | ICD-10-CM | POA: Diagnosis not present

## 2018-05-23 DIAGNOSIS — M7530 Calcific tendinitis of unspecified shoulder: Secondary | ICD-10-CM | POA: Diagnosis not present

## 2018-05-23 DIAGNOSIS — M25611 Stiffness of right shoulder, not elsewhere classified: Secondary | ICD-10-CM | POA: Diagnosis not present

## 2018-05-28 DIAGNOSIS — M25612 Stiffness of left shoulder, not elsewhere classified: Secondary | ICD-10-CM | POA: Diagnosis not present

## 2018-05-28 DIAGNOSIS — M25611 Stiffness of right shoulder, not elsewhere classified: Secondary | ICD-10-CM | POA: Diagnosis not present

## 2018-05-28 DIAGNOSIS — M7530 Calcific tendinitis of unspecified shoulder: Secondary | ICD-10-CM | POA: Diagnosis not present

## 2018-05-28 DIAGNOSIS — M25511 Pain in right shoulder: Secondary | ICD-10-CM | POA: Diagnosis not present

## 2018-05-30 DIAGNOSIS — M25511 Pain in right shoulder: Secondary | ICD-10-CM | POA: Diagnosis not present

## 2018-05-30 DIAGNOSIS — M25611 Stiffness of right shoulder, not elsewhere classified: Secondary | ICD-10-CM | POA: Diagnosis not present

## 2018-05-30 DIAGNOSIS — M25612 Stiffness of left shoulder, not elsewhere classified: Secondary | ICD-10-CM | POA: Diagnosis not present

## 2018-05-30 DIAGNOSIS — M7530 Calcific tendinitis of unspecified shoulder: Secondary | ICD-10-CM | POA: Diagnosis not present

## 2018-06-11 ENCOUNTER — Encounter: Payer: Self-pay | Admitting: Internal Medicine

## 2018-06-11 DIAGNOSIS — G250 Essential tremor: Secondary | ICD-10-CM

## 2018-06-12 MED ORDER — PROPRANOLOL HCL 10 MG PO TABS: 10.0000 mg | ORAL_TABLET | Freq: Two times a day (BID) | ORAL | 3 refills | Status: DC

## 2018-06-26 ENCOUNTER — Telehealth: Payer: Self-pay | Admitting: *Deleted

## 2018-06-26 NOTE — Telephone Encounter (Signed)
Left message on patient's phone to inform him that the message has been taken care of and if he has any questions or concerns to please call the office.

## 2018-07-31 ENCOUNTER — Encounter: Payer: Self-pay | Admitting: Internal Medicine

## 2018-07-31 ENCOUNTER — Other Ambulatory Visit: Payer: Self-pay

## 2018-07-31 ENCOUNTER — Non-Acute Institutional Stay: Payer: Medicare Other | Admitting: Internal Medicine

## 2018-07-31 VITALS — BP 120/60 | HR 80 | Temp 98.7°F | Ht 71.0 in | Wt 227.0 lb

## 2018-07-31 DIAGNOSIS — R739 Hyperglycemia, unspecified: Secondary | ICD-10-CM

## 2018-07-31 DIAGNOSIS — E7800 Pure hypercholesterolemia, unspecified: Secondary | ICD-10-CM | POA: Diagnosis not present

## 2018-07-31 DIAGNOSIS — M19041 Primary osteoarthritis, right hand: Secondary | ICD-10-CM

## 2018-07-31 DIAGNOSIS — G250 Essential tremor: Secondary | ICD-10-CM

## 2018-07-31 DIAGNOSIS — L89312 Pressure ulcer of right buttock, stage 2: Secondary | ICD-10-CM | POA: Diagnosis not present

## 2018-07-31 DIAGNOSIS — M7542 Impingement syndrome of left shoulder: Secondary | ICD-10-CM | POA: Diagnosis not present

## 2018-07-31 DIAGNOSIS — M19042 Primary osteoarthritis, left hand: Secondary | ICD-10-CM | POA: Diagnosis not present

## 2018-07-31 NOTE — Progress Notes (Deleted)
Location:      Place of Service:     Provider: Renise Gillies L. Mariea Clonts, D.O., C.M.D.  Code Status: *** Goals of Care:  Advanced Directives 03/20/2018  Does Patient Have a Medical Advance Directive? Yes  Type of Advance Directive [place]  Does patient want to make changes to medical advance directive? No - Patient declined  Copy of [place] in Chart? Yes - validated most recent copy scanned in chart (See row information)     No chief complaint on file.   HPI: Patient is a 73 y.o. male seen today for medical management of chronic diseases.     Past Medical History:  Diagnosis Date  . Allergic rhinitis   . Arthritis   . Cancer (HCC)    basal cell nose  . Carotid atherosclerosis, bilateral    dvt  ?14  . DVT (deep venous thrombosis) ([place]) 11/22/2012  . GERD (gastroesophageal reflux disease)    occ  . High blood pressure   . Hypercholesteremia   . Hyperlipidemia   . Left atrial dilatation   . LVH (left ventricular hypertrophy)   . Obesity   . Seizures ([place])    "in 20's from aspirin"    Past Surgical History:  Procedure Laterality Date  . LUMBAR LAMINECTOMY/DECOMPRESSION MICRODISCECTOMY Left 11/30/2016   Procedure: LEFT LUMBAR FOUR-FIVE LAMINECTOMY FOR FACET/SYNOVIAL CYST;  Surgeon: Ashok Pall, MD;  Location: [facility];  Service: Neurosurgery;  Laterality: Left;  LAMINECTOMY FOR FACET/SYNOVIAL CYST LUMBAR 4-LUMBAR 5, LEFT  . right leg fracture s/p surgical repair with screws Right 1966  . SKIN CANCER EXCISION  2010   from his nose, no recurrence  . TOTAL KNEE ARTHROPLASTY Right 2015    Allergies  Allergen Reactions  . Asa [Aspirin] Other (See Comments)    Seizures     Outpatient Encounter Medications as of 07/31/2018  Medication Sig  . acetaminophen (TYLENOL) 500 MG tablet Take 500 mg by mouth at bedtime as needed for moderate pain.   Marland Kitchen atorvastatin (LIPITOR) 20 MG tablet Take 1 tablet (20 mg total) by mouth daily.  . calcium  carbonate (TUMS - DOSED IN MG ELEMENTAL CALCIUM) 500 MG chewable tablet Chew 1 tablet by mouth as needed for indigestion or heartburn.  . Cholecalciferol (VITAMIN D3) 2000 units TABS Take 2,000 Units by mouth every evening.   Marland Kitchen losartan-hydrochlorothiazide (HYZAAR) 100-12.5 MG tablet Take 1 tablet by mouth daily.  . naproxen sodium (ALEVE) 220 MG tablet Take 220 mg by mouth daily as needed (pain).  . propranolol (INDERAL) 10 MG tablet Take 1 tablet (10 mg total) by mouth 2 (two) times daily.   No facility-administered encounter medications on file as of 07/31/2018.     Review of Systems:  ROS  Health Maintenance  Topic Date Due  . Hepatitis C Screening  16-Mar-1946  . INFLUENZA VACCINE  11/02/2018  . TETANUS/TDAP  04/04/2023  . COLONOSCOPY  01/31/2024  . PNA vac Low Risk Adult  Completed    Physical Exam: There were no vitals filed for this visit. There is no height or weight on file to calculate BMI. Physical Exam  Labs reviewed: Basic Metabolic Panel: Recent Labs    09/11/17 03/12/18  NA 139 141  K 4.5 4.2  BUN 13 11  CREATININE 1.0 0.9   Liver Function Tests: Recent Labs    09/11/17 03/12/18 0500  AST 29 14  ALT 48* 16  ALKPHOS 61 64   No results for input(s): LIPASE,  AMYLASE in the last 8760 hours. No results for input(s): AMMONIA in the last 8760 hours. CBC: Recent Labs    09/11/17 03/12/18  WBC 8.0 9.8  HGB 15.7 14.8  HCT 45 43  PLT 276 419*   Lipid Panel: Recent Labs    09/11/17 03/12/18  CHOL 153 132  HDL 34* 35  LDLCALC 87 74  TRIG 159 118   Lab Results  Component Value Date   HGBA1C 6.6 03/12/2018    Procedures since last visit: No results found.  Assessment/Plan There are no diagnoses linked to this encounter.   Labs/tests ordered:  @ORDERS @ Next appt:  Visit date not found  Brodric Schauer L. Sanaia Jasso, D.O. [facility] [address], [place] [address] Cell Phone (Mon-Fri 8am-5pm):   [phone] On Call:  [phone] & follow prompts after 5pm & weekends Office Phone:  [phone] Office Fax:  [phone]

## 2018-07-31 NOTE — Patient Instructions (Addendum)
Endit cream can be applied after bathing. Use duoderm or similar dressing over the open place at night.

## 2018-07-31 NOTE — Progress Notes (Signed)
Location:  Occupational psychologist of Service:  Clinic (12)  Provider: Silena Wyss L. Mariea Clonts, D.O., C.M.D.  Goals of Care:  Advanced Directives 03/20/2018  Does Patient Have a Medical Advance Directive? Yes  Type of Advance Directive [place]  Does patient want to make changes to medical advance directive? No - Patient declined  Copy of [place] in Chart? Yes - validated most recent copy scanned in chart (See row information)     Chief Complaint  Patient presents with  . Medical Management of Chronic Issues    48mth follow-up    HPI: Patient is a 73 y.o. male seen today for medical management of chronic diseases.    hba1c had trended up.  Weight had, as well  He's lost some but regained a little.  He had probably been down 10 more lbs but back up with the new diet.  He's doing the chair 2 class via tele.  Not much walking except to get food.    Right hand does not ache like it did. Can't make a fist and knuckles are still stiff.  Right wrist worst--suspect carpal tunnel.  Brace is uncomfortable so he does not use much.  He had the cortisone shot in his shoulder and went to PT.  Thinks it probably helped, but still painful.  Shoulder exercises with chair fit are helpful.  Says can live with the right shoulder pain.  Does not want intervention now.  Cannot sleep in bed--sleeps in recliner.  Cannot turn onto sides.  He's actually developed a "bedsore" below his tailbone.  That's his biggest concern today.    Feels tremor is doing well with the propanolol--not bothered by the tremor as much, but does have some dry itchy skin now.  Not annoying enough to stop taking it. Sleep not altered by this.  He just got a gel pad cushion for this chair.  Only can stay an hour in the bed.  He did get a wedge pillow to help with shoulder positioning and that did not help either.     Past Medical History:  Diagnosis Date  . Allergic rhinitis   .  Arthritis   . Cancer (HCC)    basal cell nose  . Carotid atherosclerosis, bilateral    dvt  ?14  . DVT (deep venous thrombosis) ([place]) 11/22/2012  . GERD (gastroesophageal reflux disease)    occ  . High blood pressure   . Hypercholesteremia   . Hyperlipidemia   . Left atrial dilatation   . LVH (left ventricular hypertrophy)   . Obesity   . Seizures ([place])    "in 20's from aspirin"    Past Surgical History:  Procedure Laterality Date  . LUMBAR LAMINECTOMY/DECOMPRESSION MICRODISCECTOMY Left 11/30/2016   Procedure: LEFT LUMBAR FOUR-FIVE LAMINECTOMY FOR FACET/SYNOVIAL CYST;  Surgeon: Ashok Pall, MD;  Location: [facility];  Service: Neurosurgery;  Laterality: Left;  LAMINECTOMY FOR FACET/SYNOVIAL CYST LUMBAR 4-LUMBAR 5, LEFT  . right leg fracture s/p surgical repair with screws Right 1966  . SKIN CANCER EXCISION  2010   from his nose, no recurrence  . TOTAL KNEE ARTHROPLASTY Right 2015    Allergies  Allergen Reactions  . Asa [Aspirin] Other (See Comments)    Seizures     Outpatient Encounter Medications as of 07/31/2018  Medication Sig  . acetaminophen (TYLENOL) 500 MG tablet Take 500 mg by mouth at bedtime as needed for moderate pain.   Marland Kitchen atorvastatin (LIPITOR) 20  MG tablet Take 1 tablet (20 mg total) by mouth daily.  . calcium carbonate (TUMS - DOSED IN MG ELEMENTAL CALCIUM) 500 MG chewable tablet Chew 1 tablet by mouth as needed for indigestion or heartburn.  . Cholecalciferol (VITAMIN D3) 2000 units TABS Take 2,000 Units by mouth every evening.   Marland Kitchen losartan-hydrochlorothiazide (HYZAAR) 100-12.5 MG tablet Take 1 tablet by mouth daily.  . naproxen sodium (ALEVE) 220 MG tablet Take 220 mg by mouth daily as needed (pain).  . propranolol (INDERAL) 10 MG tablet Take 1 tablet (10 mg total) by mouth 2 (two) times daily.   No facility-administered encounter medications on file as of 07/31/2018.     Review of Systems:  Review of Systems  Constitutional: Negative for chills, fever,  malaise/fatigue and weight loss.  HENT: Negative for congestion.   Eyes: Negative for blurred vision.  Respiratory: Negative for cough and shortness of breath.   Cardiovascular: Negative for chest pain, palpitations and leg swelling.  Gastrointestinal: Negative for abdominal pain, constipation, diarrhea, nausea and vomiting.  Genitourinary: Negative for dysuria.  Musculoskeletal: Positive for joint pain. Negative for back pain and falls.       Shoulders and right hand  Skin: Positive for itching.  Neurological: Positive for tingling, tremors and sensory change. Negative for dizziness and loss of consciousness.  Endo/Heme/Allergies: Does not bruise/bleed easily.  Psychiatric/Behavioral: Negative for depression and memory loss. The patient is not nervous/anxious and does not have insomnia.     Health Maintenance  Topic Date Due  . Hepatitis C Screening  [birth year]  . INFLUENZA VACCINE  11/02/2018  . TETANUS/TDAP  04/04/2023  . COLONOSCOPY  01/31/2024  . PNA vac Low Risk Adult  Completed    Physical Exam: Vitals:   07/31/18 0843  BP: 120/60  Pulse: 80  Temp: 98.7 F (37.1 C)  TempSrc: Oral  SpO2: 96%  Weight: 227 lb (103 kg)  Height: 5\' 11"  (1.803 m)   Body mass index is 31.66 kg/m. Physical Exam Vitals signs reviewed.  Constitutional:      Appearance: Normal appearance.  HENT:     Head: Normocephalic and atraumatic.  Skin:    General: Skin is warm and dry.     Comments: Erythema of buttocks bilaterally in pressure area; right buttocks with small open area like finger nail shape with pink, healthy granulation tissue in that opening, no drainage, no odor, some callousing of skin around it  Neurological:     General: No focal deficit present.     Mental Status: He is alert and oriented to person, place, and time.  Psychiatric:        Mood and Affect: Mood normal.     Labs reviewed: Basic Metabolic Panel: Recent Labs    09/11/17 03/12/18  NA 139 141  K 4.5 4.2    BUN 13 11  CREATININE 1.0 0.9   Liver Function Tests: Recent Labs    09/11/17 03/12/18 0500  AST 29 14  ALT 48* 16  ALKPHOS 61 64   No results for input(s): LIPASE, AMYLASE in the last 8760 hours. No results for input(s): AMMONIA in the last 8760 hours. CBC: Recent Labs    09/11/17 03/12/18  WBC 8.0 9.8  HGB 15.7 14.8  HCT 45 43  PLT 276 419*   Lipid Panel: Recent Labs    09/11/17 03/12/18  CHOL 153 132  HDL 34* 35  LDLCALC 87 74  TRIG 159 118   Lab Results  Component Value Date  HGBA1C 6.6 03/12/2018    Procedures since last visit: No results found.  Assessment/Plan 1. Benign essential tremor -cont propanolol which is effective, but if itching becomes more bothersome, may need to consider alternative intervention  2. Pressure injury of right buttock, stage 2 ([place]) -advised to offload pressure as best he can--he tried to get a gel cushion which he only used one night so far--ideally needs to sleep in bed, but bothers shoulders so sleeps in recliner -will see what his wife got him for the chair -keep clean and dry -apply endit cream after bathing each morning or if area gets soiled and has to be cleaned -at night, apply a duoderm or similar cushioned dressing which can be purchased on [place] or at the pharmacy  3. Primary osteoarthritis of both hands -right more bothersome, tolerating right now  4. Hyperglycemia -hba1c had trended up -he's trying to do better and did lose weight from last time but now weight is the same as last time--f/u lab  5. Impingement syndrome of left shoulder -both shoulders bothersome -needs ortho f/u, but cannot leave campus now with social isolation  6. Pure hypercholesterolemia -cont current regimen with lipitor, work on diet and continue chair fit exercise  Labs/tests ordered:  Bmp, hba1c, fasting lipids in am Next appt:  4 mos med mgt  Colton Hart, D.O. [facility] [address],  [address] Cell Phone (Mon-Fri 8am-5pm):  [phone] On Call:  [phone] & follow prompts after 5pm & weekends Office Phone:  [phone] Office Fax:  [phone]

## 2018-08-01 DIAGNOSIS — E785 Hyperlipidemia, unspecified: Secondary | ICD-10-CM | POA: Diagnosis not present

## 2018-08-01 DIAGNOSIS — R739 Hyperglycemia, unspecified: Secondary | ICD-10-CM | POA: Diagnosis not present

## 2018-08-01 DIAGNOSIS — G250 Essential tremor: Secondary | ICD-10-CM | POA: Diagnosis not present

## 2018-08-01 DIAGNOSIS — E119 Type 2 diabetes mellitus without complications: Secondary | ICD-10-CM | POA: Diagnosis not present

## 2018-08-01 DIAGNOSIS — D649 Anemia, unspecified: Secondary | ICD-10-CM | POA: Diagnosis not present

## 2018-08-01 DIAGNOSIS — E7800 Pure hypercholesterolemia, unspecified: Secondary | ICD-10-CM | POA: Diagnosis not present

## 2018-08-01 LAB — BASIC METABOLIC PANEL
BUN: 12 (ref 4–21)
Creatinine: 0.9 (ref 0.6–1.3)
Glucose: 128
Potassium: 4.5 (ref 3.4–5.3)
Sodium: 145 (ref 137–147)

## 2018-08-01 LAB — LIPID PANEL
Cholesterol: 141 (ref 0–200)
HDL: 38 (ref 35–70)
LDL Cholesterol: 79
Triglycerides: 120 (ref 40–160)

## 2018-08-01 LAB — HEMOGLOBIN A1C: Hemoglobin A1C: 6.4

## 2018-08-02 ENCOUNTER — Encounter: Payer: Self-pay | Admitting: Internal Medicine

## 2018-08-05 ENCOUNTER — Telehealth: Payer: Self-pay

## 2018-08-05 NOTE — Telephone Encounter (Signed)
Discussed lab results with patient. He verbalized understanding and denied further questions.

## 2018-09-10 ENCOUNTER — Other Ambulatory Visit: Payer: Self-pay | Admitting: Internal Medicine

## 2018-09-10 DIAGNOSIS — E7800 Pure hypercholesterolemia, unspecified: Secondary | ICD-10-CM

## 2018-11-27 ENCOUNTER — Other Ambulatory Visit: Payer: Self-pay

## 2018-11-27 ENCOUNTER — Encounter: Payer: Self-pay | Admitting: Internal Medicine

## 2018-11-27 ENCOUNTER — Non-Acute Institutional Stay: Payer: Medicare Other | Admitting: Internal Medicine

## 2018-11-27 VITALS — BP 128/60 | HR 83 | Temp 98.6°F | Ht 71.0 in | Wt 239.0 lb

## 2018-11-27 DIAGNOSIS — G5601 Carpal tunnel syndrome, right upper limb: Secondary | ICD-10-CM

## 2018-11-27 DIAGNOSIS — E669 Obesity, unspecified: Secondary | ICD-10-CM | POA: Diagnosis not present

## 2018-11-27 DIAGNOSIS — M159 Polyosteoarthritis, unspecified: Secondary | ICD-10-CM | POA: Diagnosis not present

## 2018-11-27 DIAGNOSIS — G250 Essential tremor: Secondary | ICD-10-CM | POA: Diagnosis not present

## 2018-11-27 DIAGNOSIS — Z6833 Body mass index (BMI) 33.0-33.9, adult: Secondary | ICD-10-CM

## 2018-11-27 DIAGNOSIS — E7800 Pure hypercholesterolemia, unspecified: Secondary | ICD-10-CM | POA: Diagnosis not present

## 2018-11-27 DIAGNOSIS — I10 Essential (primary) hypertension: Secondary | ICD-10-CM

## 2018-11-27 NOTE — Progress Notes (Signed)
Location:  Occupational psychologist of Service:  Clinic (12)  Provider: Jeri Rawlins L. Mariea Clonts, D.O., C.M.D.  Goals of Care:  Advanced Directives 03/20/2018  Does Patient Have a Medical Advance Directive? Yes  Type of Advance Directive [place]  Does patient want to make changes to medical advance directive? No - Patient declined  Copy of [place] in Chart? Yes - validated most recent copy scanned in chart (See row information)     Chief Complaint  Patient presents with  . Medical Management of Chronic Issues    26mth follow-up    HPI: Patient is a 73 y.o. male seen today for medical management of chronic diseases.    He's gone a while now w/o pain anywhere except in his right thumb up his forearm.  When picks something up occasionally, it will be like a bolt of lightning.  Can be opening the fridge for example, but not regularly.  Allergies were real bad during lockdown.  Has always had some cough.  There was a lot of phlegm at first--it's gradually dried up.  He thinks the propranolol was causing him to dry up.  He stopped taking it.  He also had some diuresis with nocturia.  He tried once a day instead of twice a day.  His tremor is worse as a result.  Cough is no worse than before.  He has hayfever with eyes and nose being runny and congested.  He's ok with the tremor b/c he "doesn't have to write stuff".  He does not want to try anything new for the tremor.    BP was great.    He's gained 12 lbs.  He does do chair 2 three days a week.  He does not walk that much except to get his food.  He's cut back on desserts again and says he'll try to be good again.  He's eating out of boredom.  We discussed that he may have gone into the diabetic range since he has gained weight.    Past Medical History:  Diagnosis Date  . Allergic rhinitis   . Arthritis   . Cancer (HCC)    basal cell nose  . Carotid atherosclerosis, bilateral    dvt   ?14  . DVT (deep venous thrombosis) ([place]) 11/22/2012  . GERD (gastroesophageal reflux disease)    occ  . High blood pressure   . Hypercholesteremia   . Hyperlipidemia   . Left atrial dilatation   . LVH (left ventricular hypertrophy)   . Obesity   . Seizures ([place])    "in 20's from aspirin"    Past Surgical History:  Procedure Laterality Date  . LUMBAR LAMINECTOMY/DECOMPRESSION MICRODISCECTOMY Left 11/30/2016   Procedure: LEFT LUMBAR FOUR-FIVE LAMINECTOMY FOR FACET/SYNOVIAL CYST;  Surgeon: Ashok Pall, MD;  Location: [facility];  Service: Neurosurgery;  Laterality: Left;  LAMINECTOMY FOR FACET/SYNOVIAL CYST LUMBAR 4-LUMBAR 5, LEFT  . right leg fracture s/p surgical repair with screws Right 1966  . SKIN CANCER EXCISION  2010   from his nose, no recurrence  . TOTAL KNEE ARTHROPLASTY Right 2015    Allergies  Allergen Reactions  . Asa [Aspirin] Other (See Comments)    Seizures     Outpatient Encounter Medications as of 11/27/2018  Medication Sig  . acetaminophen (TYLENOL) 500 MG tablet Take 500 mg by mouth at bedtime as needed for moderate pain.   Marland Kitchen atorvastatin (LIPITOR) 20 MG tablet TAKE 1 TABLET BY MOUTH  DAILY  . calcium carbonate (TUMS - DOSED IN MG ELEMENTAL CALCIUM) 500 MG chewable tablet Chew 1 tablet by mouth as needed for indigestion or heartburn.  . Cholecalciferol (VITAMIN D3) 2000 units TABS Take 2,000 Units by mouth every evening.   Marland Kitchen losartan-hydrochlorothiazide (HYZAAR) 100-12.5 MG tablet Take 1 tablet by mouth daily.  . naproxen sodium (ALEVE) 220 MG tablet Take 220 mg by mouth daily as needed (pain).  . propranolol (INDERAL) 10 MG tablet Take 1 tablet (10 mg total) by mouth 2 (two) times daily.   No facility-administered encounter medications on file as of 11/27/2018.     Review of Systems:  Review of Systems  Constitutional: Negative for chills, fever and malaise/fatigue.  HENT: Positive for hearing loss.   Eyes: Negative for blurred vision.  Respiratory:  Negative for cough and shortness of breath.   Cardiovascular: Negative for chest pain, palpitations and leg swelling.  Gastrointestinal: Negative for abdominal pain, blood in stool, constipation, diarrhea and melena.  Genitourinary: Negative for dysuria.  Musculoskeletal: Positive for joint pain. Negative for back pain and falls.  Skin: Negative for itching and rash.  Neurological: Positive for tremors. Negative for dizziness and loss of consciousness.       Shooting pain into right thumb  Endo/Heme/Allergies: Does not bruise/bleed easily.  Psychiatric/Behavioral: Negative for depression and memory loss. The patient is not nervous/anxious and does not have insomnia.     Health Maintenance  Topic Date Due  . Hepatitis C Screening  March 25, 1946  . INFLUENZA VACCINE  11/02/2018  . TETANUS/TDAP  04/04/2023  . COLONOSCOPY  01/31/2024  . PNA vac Low Risk Adult  Completed    Physical Exam: Vitals:   11/27/18 0833  BP: 128/60  Pulse: 83  Temp: 98.6 F (37 C)  TempSrc: Oral  SpO2: 93%  Weight: 239 lb (108.4 kg)  Height: 5\' 11"  (1.803 m)   Body mass index is 33.33 kg/m. Physical Exam Vitals signs reviewed.  Constitutional:      General: He is not in acute distress.    Appearance: Normal appearance. He is obese. He is not ill-appearing or toxic-appearing.  HENT:     Head: Normocephalic and atraumatic.  Eyes:     Comments: glasses  Cardiovascular:     Rate and Rhythm: Normal rate and regular rhythm.     Pulses: Normal pulses.     Heart sounds: Normal heart sounds.  Pulmonary:     Effort: Pulmonary effort is normal.     Breath sounds: Normal breath sounds. No wheezing, rhonchi or rales.  Abdominal:     General: Bowel sounds are normal.  Musculoskeletal: Normal range of motion.     Right lower leg: No edema.     Left lower leg: No edema.  Skin:    General: Skin is warm and dry.  Neurological:     General: No focal deficit present.     Mental Status: He is alert.      Motor: No weakness.     Gait: Gait normal.     Comments: Right greater than left fast tremor of hand  Psychiatric:        Mood and Affect: Mood normal.        Behavior: Behavior normal.        Thought Content: Thought content normal.        Judgment: Judgment normal.     Labs reviewed: Basic Metabolic Panel: Recent Labs    03/12/18 08/01/18 0400  NA 141 145  K  4.2 4.5  BUN 11 12  CREATININE 0.9 0.9   Liver Function Tests: Recent Labs    03/12/18 0500  AST 14  ALT 16  ALKPHOS 64   No results for input(s): LIPASE, AMYLASE in the last 8760 hours. No results for input(s): AMMONIA in the last 8760 hours. CBC: Recent Labs    03/12/18  WBC 9.8  HGB 14.8  HCT 43  PLT 419*   Lipid Panel: Recent Labs    03/12/18 08/01/18 0400  CHOL 132 141  HDL 35 38  LDLCALC 74 79  TRIG 118 120   Lab Results  Component Value Date   HGBA1C 6.4 08/01/2018     Assessment/Plan 1. Benign essential tremor -has not been taking his propranolol b/c he feels like it was working as a diuretic and making his cough from hayfever dry and causing nocturia -reducing to once a day was not effective at reducing these noted side effects -he does not want to take anything different at this time b/c it's not that bothersome now -still has cough and we discussed hayfever mgt with second gen antihistamines and steroid nasal spray and he may "experiment" with these  2. Pure hypercholesterolemia -was at goal last time but likely has increased again due to his recently poor eating habits -f/u lab before next visit -get back on track with diet -cont chairfit 2 -cont lipitor  3. Essential hypertension, benign -cont losartan/hctz which is a diuretic for bp -well controlled  4. BMI 33.0-33.9,adult -weight gain of 12 lbs since last visit -encouraged walking as able, cont chair fit 2 and improved diet with reduction of desserts  5. Obesity (BMI 30.0-34.9) -as in #4, dietary counseling provided   6. Generalized osteoarthritis of multiple sites -doing better lately, occasional aleve use, but not bothersome routinely past few months  7. Right carpal tunnel syndrome -seems consistent with nerve pains with certain positioning, may also be a component of CMC arthritis -does not want intervention -cont wrist splint when consistently bothersome for periods  Labs/tests ordered:  Cbc, cmp, flp, hba1c Next appt:  4 mos, fasting labs before  Leetta Hendriks L. Jaysiah Marchetta, D.O. [facility] [address], [place] [address] Cell Phone (Mon-Fri 8am-5pm):  [phone] On Call:  [phone] & follow prompts after 5pm & weekends Office Phone:  [phone] Office Fax:  [phone]

## 2019-01-16 DIAGNOSIS — Z23 Encounter for immunization: Secondary | ICD-10-CM | POA: Diagnosis not present

## 2019-01-23 ENCOUNTER — Other Ambulatory Visit: Payer: Self-pay

## 2019-01-23 ENCOUNTER — Encounter: Payer: Self-pay | Admitting: Family

## 2019-01-23 ENCOUNTER — Ambulatory Visit: Payer: Medicare Other | Admitting: Family

## 2019-01-23 DIAGNOSIS — Z0000 Encounter for general adult medical examination without abnormal findings: Secondary | ICD-10-CM

## 2019-01-23 NOTE — Progress Notes (Signed)
Subjective:   KARMELLO SILCOX is a 73 y.o. male who presents for Medicare Annual/Subsequent preventive examination.  Review of Systems:  Cardiac Risk Factors include: advanced age (>53men, >84 women);male gender;hypertension;dyslipidemia;obesity (BMI >30kg/m2);sedentary lifestyle;smoking/ tobacco exposure     Objective:    Vitals: There were no vitals taken for this visit.  There is no height or weight on file to calculate BMI.  Advanced Directives 01/23/2019 03/20/2018 02/05/2018 09/19/2017 03/21/2017 01/10/2017 01/09/2017  Does Patient Have a Medical Advance Directive? No Yes Yes Yes Yes Yes Yes  Type of Advance Directive - Personal assistant Power of [employer] Power of [place] of [place]  Does patient want to make changes to medical advance directive? - No - Patient declined No - Patient declined No - Patient declined No - Patient declined - No - Patient declined  Copy of [place] in Chart? - Yes - validated most recent copy scanned in chart (See row information) No - copy requested Yes Yes Yes Yes  Would patient like information on creating a medical advance directive? No - Patient declined - - - - - -    Tobacco Social History   Tobacco Use  Smoking Status Former Smoker  . Packs/day: 1.00  . Years: 20.00  . Pack years: 20.00  . Types: Cigarettes  . Quit date: 11/27/1988  . Years since quitting: 30.1  Smokeless Tobacco Never Used     Counseling given: Not Answered   Clinical Intake:  Pre-visit preparation completed: No  Pain : No/denies pain     BMI - recorded: 33.33 Nutritional Status: BMI > 30  Obese Nutritional Risks: None Diabetes: No  How often do you need to have someone help you when you read instructions, pamphlets, or other written materials from your doctor or pharmacy?: 1 - Never What is the last grade level you completed in  school?: PHd Degree  Interpreter Needed?: No  Information entered by :: Lino Wickliff FNP-C  Past Medical History:  Diagnosis Date  . Allergic rhinitis   . Arthritis   . Cancer (HCC)    basal cell nose  . Carotid atherosclerosis, bilateral    dvt  ?14  . DVT (deep venous thrombosis) ([place]) 11/22/2012  . GERD (gastroesophageal reflux disease)    occ  . High blood pressure   . Hypercholesteremia   . Hyperlipidemia   . Left atrial dilatation   . LVH (left ventricular hypertrophy)   . Obesity   . Seizures ([place])    "in 20's from aspirin"   Past Surgical History:  Procedure Laterality Date  . LUMBAR LAMINECTOMY/DECOMPRESSION MICRODISCECTOMY Left 11/30/2016   Procedure: LEFT LUMBAR FOUR-FIVE LAMINECTOMY FOR FACET/SYNOVIAL CYST;  Surgeon: Ashok Pall, MD;  Location: [facility];  Service: Neurosurgery;  Laterality: Left;  LAMINECTOMY FOR FACET/SYNOVIAL CYST LUMBAR 4-LUMBAR 5, LEFT  . right leg fracture s/p surgical repair with screws Right 1966  . SKIN CANCER EXCISION  2010   from his nose, no recurrence  . TOTAL KNEE ARTHROPLASTY Right 2015   Family History  Problem Relation Age of Onset  . Alzheimer's disease Mother   . Cancer Mother        Skin  . Alzheimer's disease Father   . Cancer Father        colon, liver, skin  . Colon polyps Father   . Colon cancer Father    Social History   Socioeconomic History  .  Marital status: Married    Spouse name: Not on file  . Number of children: Not on file  . Years of education: Not on file  . Highest education level: Not on file  Occupational History  . Not on file  Social Needs  . Financial resource strain: Not hard at all  . Food insecurity    Worry: Never true    Inability: Never true  . Transportation needs    Medical: No    Non-medical: No  Tobacco Use  . Smoking status: Former Smoker    Packs/day: 1.00    Years: 20.00    Pack years: 20.00    Types: Cigarettes    Quit date: 11/27/1988    Years since quitting: 30.1   . Smokeless tobacco: Never Used  Substance and Sexual Activity  . Alcohol use: No  . Drug use: No  . Sexual activity: Not on file  Lifestyle  . Physical activity    Days per week: 7 days    Minutes per session: 30 min  . Stress: Only a little  Relationships  . Social connections    Talks on phone: More than three times a week    Gets together: More than three times a week    Attends religious service: Never    Active member of club or organization: No    Attends meetings of clubs or organizations: Never    Relationship status: Married  Other Topics Concern  . Not on file  Social History Narrative   Diet?        Do you drink/eat things with caffeine? Yes      Marital status?            Married                        What year were you married? 2001      Do you live in a house, apartment, assisted living, condo, trailer, etc.? home      Is it one or more stories? no      How many persons live in your home? 2      Do you have any pets in your home? (please list) no      Current or past profession: IT consultant      Do you exercise?      No                                Type & how often? Walk 1-2 miles, 1-2 times per week      Do you have a living will? No      Do you have a DNR form?    No                              If not, do you want to discuss one? Yes      Do you have signed POA/HPOA for forms?  Yes    Outpatient Encounter Medications as of 01/23/2019  Medication Sig  . acetaminophen (TYLENOL) 500 MG tablet Take 500 mg by mouth at bedtime as needed for moderate pain.   Marland Kitchen atorvastatin (LIPITOR) 20 MG tablet TAKE 1 TABLET BY MOUTH  DAILY  . calcium carbonate (TUMS - DOSED IN MG ELEMENTAL CALCIUM) 500 MG chewable tablet Chew 1 tablet by mouth as needed for indigestion  or heartburn.  . Cholecalciferol (VITAMIN D3) 2000 units TABS Take 2,000 Units by mouth every evening.   Marland Kitchen losartan-hydrochlorothiazide (HYZAAR) 100-12.5 MG tablet Take 1 tablet by mouth daily.  .  naproxen sodium (ALEVE) 220 MG tablet Take 220 mg by mouth daily as needed (pain).   No facility-administered encounter medications on file as of 01/23/2019.     Activities of Daily Living In your present state of health, do you have any difficulty performing the following activities: 01/23/2019 02/05/2018  Hearing? N N  Vision? N N  Difficulty concentrating or making decisions? N N  Walking or climbing stairs? N N  Dressing or bathing? N N  Doing errands, shopping? N N  Preparing Food and eating ? N N  Using the Toilet? N N  In the past six months, have you accidently leaked urine? N N  Do you have problems with loss of bowel control? N N  Managing your Medications? N N  Managing your Finances? N N  Housekeeping or managing your Housekeeping? Y N  Comment has assistance once a week -  Some recent data might be hidden    Patient Care Team: Gayland Curry, DO as PCP - General (Geriatric Medicine)   Assessment:   This is a routine wellness examination for Takumi.  Exercise Activities and Dietary recommendations Current Exercise Habits: The patient does not participate in regular exercise at present, Exercise limited by: None identified  Goals    . Maintain Lifestyle     Starting today pt will maintain lifestyle.        Fall Risk Fall Risk  01/23/2019 11/27/2018 07/31/2018 05/01/2018 03/20/2018  Falls in the past year? 0 0 0 0 0  Number falls in past yr: 0 0 0 0 0  Injury with Fall? 0 0 0 0 0   Is the patient's home free of loose throw rugs in walkways, pet beds, electrical cords, etc?   no      Grab bars in the bathroom? yes      Handrails on the stairs?   no steps       Adequate lighting?   yes  Depression Screen PHQ 2/9 Scores 01/23/2019 11/27/2018 07/31/2018 05/01/2018  PHQ - 2 Score 0 0 0 0    Cognitive Function MMSE - Mini Mental State Exam 02/05/2018 01/09/2017  Orientation to time 5 5  Orientation to Place 5 5  Registration 3 3  Attention/ Calculation 5 5   Recall 3 3  Language- name 2 objects 2 2  Language- repeat 1 1  Language- follow 3 step command 3 3  Language- read & follow direction 1 1  Write a sentence 1 1  Copy design 1 1  Total score 30 30     6CIT Screen 01/23/2019  What Year? 0 points  What month? 0 points  What time? 0 points  Count back from 20 0 points  Months in reverse 0 points  Repeat phrase 2 points  Total Score 2    Immunization History  Administered Date(s) Administered  . Hep A / Hep B 04/03/2002  . Influenza, High Dose Seasonal PF 01/09/2014, 01/07/2015, 01/09/2018, 01/10/2019  . Influenza-Unspecified 02/18/2016, 01/22/2017  . Pneumococcal Conjugate-13 09/19/2017  . Pneumococcal-Unspecified 09/30/2013, 01/02/2015  . Tdap 04/03/2013  . Zoster Recombinat (Shingrix) 08/18/2017, 10/20/2017, 11/29/2017    Qualifies for Shingles Vaccine?  Up to date   Screening Tests Health Maintenance  Topic Date Due  . Hepatitis C Screening  [birth year]  .  TETANUS/TDAP  04/04/2023  . COLONOSCOPY  01/31/2024  . INFLUENZA VACCINE  Completed  . PNA vac Low Risk Adult  Completed   Cancer Screenings: Lung: Low Dose CT Chest recommended if Age 53-80 years, 30 pack-year currently smoking OR have quit w/in 15years. Patient does not qualify. Colorectal: Up to date due 01/31/2024  Additional Screenings: Hepatitis C Screening: Low risk       Plan:   I have personally reviewed and noted the following in the patient's chart:   . Medical and social history . Use of alcohol, tobacco or illicit drugs  . Current medications and supplements . Functional ability and status . Nutritional status . Physical activity . Advanced directives . List of other physicians . Hospitalizations, surgeries, and ER visits in previous 12 months . Vitals . Screenings to include cognitive, depression, and falls . Referrals and appointments  In addition, I have reviewed and discussed with patient certain preventive protocols, quality  metrics, and best practice recommendations. A written personalized care plan for preventive services as well as general preventive health recommendations were provided to patient.    Sandrea Hughs, NP  01/23/2019

## 2019-01-23 NOTE — Progress Notes (Signed)
This service is provided via telemedicine  No vital signs collected/recorded due to the encounter was a telemedicine visit.   Location of patient (ex: home, work):  Home   Patient consents to a telephone visit:  Yes  Location of the provider (ex: office, home): Office.   Name of any referring provider:  [place] of all persons participating in the telemedicine service and their role in the encounter:  Dinah Ngetich NP, Ruthell Rummage CMA, and Saul Fordyce   Time spent on call:  Ruthell Rummage CMA spent 8  minutes on phone with patient.

## 2019-01-23 NOTE — Patient Instructions (Signed)
Colton Hart , Thank you for taking time to come for your Medicare Wellness Visit. I appreciate your ongoing commitment to your health goals. Please review the following plan we discussed and let me know if I can assist you in the future.   Screening recommendations/referrals: Colonoscopy : Up to date due 01/31/2024 Recommended yearly ophthalmology/optometry visit for glaucoma screening and checkup Recommended yearly dental visit for hygiene and checkup  Vaccinations: Influenza vaccine: Up to date Pneumococcal vaccine: Up to date  Tdap vaccine: Up to date due 01/31/2024 Shingles vaccine : Up to date  Advanced directives: No   Conditions/risks identified: Advance age male > 63 yrs,male Gender,hypertension,Hyperlipidemia,Obesity,Sedentary lifestyle,Hx of smoking   Next appointment: 1 year   Preventive Care 71 Years and Older, Male Preventive care refers to lifestyle choices and visits with your health care provider that can promote health and wellness. What does preventive care include?  A yearly physical exam. This is also called an annual well check.  Dental exams once or twice a year.  Routine eye exams. Ask your health care provider how often you should have your eyes checked.  Personal lifestyle choices, including:  Daily care of your teeth and gums.  Regular physical activity.  Eating a healthy diet.  Avoiding tobacco and drug use.  Limiting alcohol use.  Practicing safe sex.  Taking low doses of aspirin every day.  Taking vitamin and mineral supplements as recommended by your health care provider. What happens during an annual well check? The services and screenings done by your health care provider during your annual well check will depend on your age, overall health, lifestyle risk factors, and family history of disease. Counseling  Your health care provider may ask you questions about your:  Alcohol use.  Tobacco use.  Drug use.  Emotional well-being.   Home and relationship well-being.  Sexual activity.  Eating habits.  History of falls.  Memory and ability to understand (cognition).  Work and work Statistician. Screening  You may have the following tests or measurements:  Height, weight, and BMI.  Blood pressure.  Lipid and cholesterol levels. These may be checked every 5 years, or more frequently if you are over 12 years old.  Skin check.  Lung cancer screening. You may have this screening every year starting at age 71 if you have a 30-pack-year history of smoking and currently smoke or have quit within the past 15 years.  Fecal occult blood test (FOBT) of the stool. You may have this test every year starting at age 71.  Flexible sigmoidoscopy or colonoscopy. You may have a sigmoidoscopy every 5 years or a colonoscopy every 10 years starting at age 71.  Prostate cancer screening. Recommendations will vary depending on your family history and other risks.  Hepatitis C blood test.  Hepatitis B blood test.  Sexually transmitted disease (STD) testing.  Diabetes screening. This is done by checking your blood sugar (glucose) after you have not eaten for a while (fasting). You may have this done every 1-3 years.  Abdominal aortic aneurysm (AAA) screening. You may need this if you are a current or former smoker.  Osteoporosis. You may be screened starting at age 71 if you are at high risk. Talk with your health care provider about your test results, treatment options, and if necessary, the need for more tests. Vaccines  Your health care provider may recommend certain vaccines, such as:  Influenza vaccine. This is recommended every year.  Tetanus, diphtheria, and acellular pertussis (Tdap, Td)  vaccine. You may need a Td booster every 10 years.  Zoster vaccine. You may need this after age 47.  Pneumococcal 13-valent conjugate (PCV13) vaccine. One dose is recommended after age 84.  Pneumococcal polysaccharide (PPSV23)  vaccine. One dose is recommended after age 93. Talk to your health care provider about which screenings and vaccines you need and how often you need them. This information is not intended to replace advice given to you by your health care provider. Make sure you discuss any questions you have with your health care provider. Document Released: 04/16/2015 Document Revised: 12/08/2015 Document Reviewed: 01/19/2015 Elsevier Interactive Patient Education  2017 [place] Prevention in the Home Falls can cause injuries. They can happen to people of all ages. There are many things you can do to make your home safe and to help prevent falls. What can I do on the outside of my home?  Regularly fix the edges of walkways and driveways and fix any cracks.  Remove anything that might make you trip as you walk through a door, such as a raised step or threshold.  Trim any bushes or trees on the path to your home.  Use bright outdoor lighting.  Clear any walking paths of anything that might make someone trip, such as rocks or tools.  Regularly check to see if handrails are loose or broken. Make sure that both sides of any steps have handrails.  Any raised decks and porches should have guardrails on the edges.  Have any leaves, snow, or ice cleared regularly.  Use sand or salt on walking paths during winter.  Clean up any spills in your garage right away. This includes oil or grease spills. What can I do in the bathroom?  Use night lights.  Install grab bars by the toilet and in the tub and shower. Do not use towel bars as grab bars.  Use non-skid mats or decals in the tub or shower.  If you need to sit down in the shower, use a plastic, non-slip stool.  Keep the floor dry. Clean up any water that spills on the floor as soon as it happens.  Remove soap buildup in the tub or shower regularly.  Attach bath mats securely with double-sided non-slip rug tape.  Do not have throw rugs  and other things on the floor that can make you trip. What can I do in the bedroom?  Use night lights.  Make sure that you have a light by your bed that is easy to reach.  Do not use any sheets or blankets that are too big for your bed. They should not hang down onto the floor.  Have a firm chair that has side arms. You can use this for support while you get dressed.  Do not have throw rugs and other things on the floor that can make you trip. What can I do in the kitchen?  Clean up any spills right away.  Avoid walking on wet floors.  Keep items that you use a lot in easy-to-reach places.  If you need to reach something above you, use a strong step stool that has a grab bar.  Keep electrical cords out of the way.  Do not use floor polish or wax that makes floors slippery. If you must use wax, use non-skid floor wax.  Do not have throw rugs and other things on the floor that can make you trip. What can I do with my stairs?  Do not leave  any items on the stairs.  Make sure that there are handrails on both sides of the stairs and use them. Fix handrails that are broken or loose. Make sure that handrails are as long as the stairways.  Check any carpeting to make sure that it is firmly attached to the stairs. Fix any carpet that is loose or worn.  Avoid having throw rugs at the top or bottom of the stairs. If you do have throw rugs, attach them to the floor with carpet tape.  Make sure that you have a light switch at the top of the stairs and the bottom of the stairs. If you do not have them, ask someone to add them for you. What else can I do to help prevent falls?  Wear shoes that:  Do not have high heels.  Have rubber bottoms.  Are comfortable and fit you well.  Are closed at the toe. Do not wear sandals.  If you use a stepladder:  Make sure that it is fully opened. Do not climb a closed stepladder.  Make sure that both sides of the stepladder are locked into  place.  Ask someone to hold it for you, if possible.  Clearly mark and make sure that you can see:  Any grab bars or handrails.  First and last steps.  Where the edge of each step is.  Use tools that help you move around (mobility aids) if they are needed. These include:  Canes.  Walkers.  Scooters.  Crutches.  Turn on the lights when you go into a dark area. Replace any light bulbs as soon as they burn out.  Set up your furniture so you have a clear path. Avoid moving your furniture around.  If any of your floors are uneven, fix them.  If there are any pets around you, be aware of where they are.  Review your medicines with your doctor. Some medicines can make you feel dizzy. This can increase your chance of falling. Ask your doctor what other things that you can do to help prevent falls. This information is not intended to replace advice given to you by your health care provider. Make sure you discuss any questions you have with your health care provider. Document Released: 01/14/2009 Document Revised: 08/26/2015 Document Reviewed: 04/24/2014 Elsevier Interactive Patient Education  2017 [employer].

## 2019-02-26 ENCOUNTER — Other Ambulatory Visit: Payer: Self-pay | Admitting: Internal Medicine

## 2019-02-26 DIAGNOSIS — E7800 Pure hypercholesterolemia, unspecified: Secondary | ICD-10-CM

## 2019-03-06 ENCOUNTER — Other Ambulatory Visit: Payer: Self-pay | Admitting: Internal Medicine

## 2019-03-06 DIAGNOSIS — I10 Essential (primary) hypertension: Secondary | ICD-10-CM

## 2019-03-18 ENCOUNTER — Encounter: Payer: Self-pay | Admitting: Internal Medicine

## 2019-03-26 ENCOUNTER — Encounter: Payer: Self-pay | Admitting: Internal Medicine

## 2019-03-26 ENCOUNTER — Other Ambulatory Visit: Payer: Self-pay

## 2019-03-26 ENCOUNTER — Non-Acute Institutional Stay: Payer: Medicare Other | Admitting: Internal Medicine

## 2019-03-26 VITALS — BP 126/76 | HR 85 | Temp 97.9°F | Ht 71.0 in | Wt 218.0 lb

## 2019-03-26 DIAGNOSIS — K921 Melena: Secondary | ICD-10-CM | POA: Diagnosis not present

## 2019-03-26 DIAGNOSIS — J3089 Other allergic rhinitis: Secondary | ICD-10-CM

## 2019-03-26 DIAGNOSIS — Z7189 Other specified counseling: Secondary | ICD-10-CM | POA: Diagnosis not present

## 2019-03-26 DIAGNOSIS — R634 Abnormal weight loss: Secondary | ICD-10-CM

## 2019-03-26 DIAGNOSIS — R399 Unspecified symptoms and signs involving the genitourinary system: Secondary | ICD-10-CM

## 2019-03-26 DIAGNOSIS — E7800 Pure hypercholesterolemia, unspecified: Secondary | ICD-10-CM | POA: Diagnosis not present

## 2019-03-26 DIAGNOSIS — R739 Hyperglycemia, unspecified: Secondary | ICD-10-CM | POA: Diagnosis not present

## 2019-03-26 NOTE — Progress Notes (Signed)
Location:  Occupational psychologist of Service:  Clinic (12)  Provider: Boubacar Lerette L. Mariea Clonts, D.O., C.M.D.  Code Status: DNR completed today at his request Goals of Care:  Advanced Directives 03/26/2019  Does Patient Have a Medical Advance Directive? Yes  Type of Advance Directive [place]  Does patient want to make changes to medical advance directive? No - Patient declined  Copy of [place] in Chart? Yes - validated most recent copy scanned in chart (See row information)  Would patient like information on creating a medical advance directive? -   Chief Complaint  Patient presents with  . Medical Management of Chronic Issues    4 month follow up    HPI: Patient is a 72 y.o. male seen today for medical management of chronic diseases.    1.  Congestion--thinks allergies.  Going on for a full month.  Feels like he's full of water.  No other swelling noted.  Has a lot of postnasal drainage.  Does have intermittent itchy,watery eyes. 2.  When moved here saw Dr. Havery Moros but cscope was not due. Was now due 10/20.  He admits also with further questioning only that he's actually had very dark stools "for some time now".  He has not had any other changes to his stool--no hematochezia or changes otherwise in caliber of stool.  Has family history of colon cancer.  He has lost weight 21 lbs over 4 mos.   3.  Real problems trying to urinate frequently now especially when he first gets up.  Starting the stream is an issue.  Ok once started.  Sleeps through the night.  Sometimes both urgency and frequency also can occur in the day, but not consistently.   4.  Asks about covid vaccine 5.  He would like the MOST form.  He does request the DNR form.    Past Medical History:  Diagnosis Date  . Allergic rhinitis   . Arthritis   . Cancer (HCC)    basal cell nose  . Carotid atherosclerosis, bilateral    dvt  ?14  . DVT (deep venous thrombosis)  ([place]) 11/22/2012  . GERD (gastroesophageal reflux disease)    occ  . High blood pressure   . Hypercholesteremia   . Hyperlipidemia   . Left atrial dilatation   . LVH (left ventricular hypertrophy)   . Obesity   . Seizures ([place])    "in 20's from aspirin"    Past Surgical History:  Procedure Laterality Date  . LUMBAR LAMINECTOMY/DECOMPRESSION MICRODISCECTOMY Left 11/30/2016   Procedure: LEFT LUMBAR FOUR-FIVE LAMINECTOMY FOR FACET/SYNOVIAL CYST;  Surgeon: Ashok Pall, MD;  Location: [facility];  Service: Neurosurgery;  Laterality: Left;  LAMINECTOMY FOR FACET/SYNOVIAL CYST LUMBAR 4-LUMBAR 5, LEFT  . right leg fracture s/p surgical repair with screws Right 1966  . SKIN CANCER EXCISION  2010   from his nose, no recurrence  . TOTAL KNEE ARTHROPLASTY Right 2015    Allergies  Allergen Reactions  . Asa [Aspirin] Other (See Comments)    Seizures     Outpatient Encounter Medications as of 03/26/2019  Medication Sig  . acetaminophen (TYLENOL) 500 MG tablet Take 500 mg by mouth at bedtime as needed for moderate pain.   Marland Kitchen atorvastatin (LIPITOR) 20 MG tablet TAKE 1 TABLET BY MOUTH  DAILY  . calcium carbonate (TUMS - DOSED IN MG ELEMENTAL CALCIUM) 500 MG chewable tablet Chew 1 tablet by mouth as needed for indigestion or heartburn.  Marland Kitchen  Cholecalciferol (VITAMIN D3) 2000 units TABS Take 2,000 Units by mouth every evening.   Marland Kitchen losartan-hydrochlorothiazide (HYZAAR) 100-12.5 MG tablet TAKE 1 TABLET BY MOUTH  DAILY  . naproxen sodium (ALEVE) 220 MG tablet Take 220 mg by mouth daily as needed (pain).   No facility-administered encounter medications on file as of 03/26/2019.    Review of Systems:  Review of Systems  Constitutional: Positive for weight loss. Negative for chills, fever and malaise/fatigue.  HENT: Positive for congestion. Negative for sinus pain and sore throat.        Postnasal drip  Eyes: Negative for blurred vision.       Glasses  Respiratory: Positive for cough. Negative for  sputum production, shortness of breath, wheezing and stridor.   Cardiovascular: Negative for chest pain, palpitations and leg swelling.  Gastrointestinal: Positive for melena. Negative for abdominal pain, blood in stool, constipation and diarrhea.  Genitourinary: Negative for dysuria.  Musculoskeletal: Negative for back pain, falls and joint pain.       Quit working out, denies any pains  Skin: Negative for itching and rash.  Neurological: Negative for dizziness and loss of consciousness.  Psychiatric/Behavioral: Negative for depression and memory loss. The patient is not nervous/anxious and does not have insomnia.        Admits to some covid blues and he's not a big fan of the christmas holiday so he says he'll work through it    Health Maintenance  Topic Date Due  . Hepatitis C Screening  Jul 20, 1945  . TETANUS/TDAP  04/04/2023  . COLONOSCOPY  01/31/2024  . INFLUENZA VACCINE  Completed  . PNA vac Low Risk Adult  Completed    Physical Exam: Vitals:   03/26/19 0845  BP: 126/76  Pulse: 85  Temp: 97.9 F (36.6 C)  TempSrc: Tympanic  SpO2: 96%  Weight: 218 lb (98.9 kg)  Height: 5\' 11"  (1.803 m)   Body mass index is 30.4 kg/m. Physical Exam Vitals reviewed.  Constitutional:      General: He is not in acute distress.    Appearance: He is not ill-appearing or toxic-appearing.     Comments: Disheveled appearance today  HENT:     Head: Normocephalic and atraumatic.  Eyes:     Comments: glasses  Cardiovascular:     Rate and Rhythm: Normal rate and regular rhythm.  Pulmonary:     Effort: Pulmonary effort is normal.     Breath sounds: Normal breath sounds. No wheezing, rhonchi or rales.  Abdominal:     General: Bowel sounds are normal.     Tenderness: There is no abdominal tenderness.  Musculoskeletal:        General: Normal range of motion.     Right lower leg: No edema.     Left lower leg: No edema.  Skin:    General: Skin is warm and dry.  Neurological:     General:  No focal deficit present.     Mental Status: He is alert and oriented to person, place, and time.     Comments: Essential tremor  Psychiatric:        Mood and Affect: Mood normal.     Labs reviewed: Basic Metabolic Panel: Recent Labs    08/01/18 0400  NA 145  K 4.5  BUN 12  CREATININE 0.9   Liver Function Tests: No results for input(s): AST, ALT, ALKPHOS, BILITOT, PROT, ALBUMIN in the last 8760 hours. No results for input(s): LIPASE, AMYLASE in the last 8760 hours. No results  for input(s): AMMONIA in the last 8760 hours. CBC: No results for input(s): WBC, NEUTROABS, HGB, HCT, MCV, PLT in the last 8760 hours. Lipid Panel: Recent Labs    08/01/18 0400  CHOL 141  HDL 38  LDLCALC 79  TRIG 120   Lab Results  Component Value Date   HGBA1C 6.4 08/01/2018    Assessment/Plan 1. Lower urinary tract symptoms (LUTS) - now with difficulty starting stream on a pretty frequent basis, occasional urgency and frequency  - Ambulatory referral to Urology  2. Melena -I'm concerned that his dark stools may indicate a polyp or tumor in his bowel along with his weight loss - Ambulatory referral to Gastroenterology for cscope with Dr. Havery Moros  3. Weight loss -concerning, he has decreased portion sizes, but not made other changes, is also having dark stools which he kept quiet until I specifically asked him -referred for cscope, also to see urology due to new regular prostate symptoms  4. ACP (advance care planning) - discussed today and he requests DNR--no CPR if he has a cardiac arrest -discussed MOST form and he took this with him and he and his wife will discuss - DNR (Do Not Resuscitate) -16 mins spent on ACP discussion  5. Hyperglycemia -hba1c and fasting sugars trended down with weight loss, cont reduced portions, encouraged at least some walking since he thinks exercise classes made his joints more painful  6. Pure hypercholesterolemia -lipids stable, goal LDL will be  less than 70  7. Environmental and seasonal allergies -continue zyrtec therapy  Labs/tests ordered:  Cbc with diff, hba1c before Next appt:  07/23/2019  Emmajane Altamura L. Abria Vannostrand, D.O. [facility] [address], [place] [address] Cell Phone (Mon-Fri 8am-5pm):  [phone] On Call:  [phone] & follow prompts after 5pm & weekends Office Phone:  [phone] Office Fax:  [phone]

## 2019-04-17 DIAGNOSIS — Z23 Encounter for immunization: Secondary | ICD-10-CM | POA: Diagnosis not present

## 2019-05-01 DIAGNOSIS — R3912 Poor urinary stream: Secondary | ICD-10-CM | POA: Diagnosis not present

## 2019-05-02 ENCOUNTER — Other Ambulatory Visit: Payer: Self-pay

## 2019-05-02 ENCOUNTER — Ambulatory Visit (INDEPENDENT_AMBULATORY_CARE_PROVIDER_SITE_OTHER): Payer: Medicare Other | Admitting: Gastroenterology

## 2019-05-02 ENCOUNTER — Encounter: Payer: Self-pay | Admitting: Gastroenterology

## 2019-05-02 VITALS — BP 100/68 | HR 72 | Temp 97.3°F | Ht 71.0 in | Wt 214.6 lb

## 2019-05-02 DIAGNOSIS — I10 Essential (primary) hypertension: Secondary | ICD-10-CM

## 2019-05-02 DIAGNOSIS — Z86010 Personal history of colonic polyps: Secondary | ICD-10-CM

## 2019-05-02 MED ORDER — SUPREP BOWEL PREP KIT 17.5-3.13-1.6 GM/177ML PO SOLN: ORAL | 0 refills | Status: DC

## 2019-05-02 NOTE — Progress Notes (Signed)
HPI :  74 year old male here for a follow-up visit to discuss colonoscopy.  He has a history of DVT, history of colon polyps, hypertension.  I last saw him in August 2018 where we reviewed his history.  His father had colon cancer diagnosed in his age 89s.  No other known family history of colon cancer.  His last colonoscopy was in 2015 which was normal.  His colonoscopy before that was in 2011 at which point time he had 2 adenomas removed.  We had discussed at that time per national guidelines that his next colonoscopy was not due until 2025, he was very uncomfortable waiting until that time, a previous GI doctor told him he needed a colonoscopy every 3 years , and we mutually agreed to perform this 5 years after his last exam.  He is hoping to proceed with a colonoscopy at this time.  He denies any blood in his stools.  He states his stools have been dark brown but not black or tarry.  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  He is lost about 20 pounds on his own and doing well.  He feels very well in general.  He denies any reflux or problems eating.  Of note, he was started on Flomax for urinary issues and states this is dropped his blood pressure to the low [id number] systolic.  He has occasional dizziness and lightheadedness and is scheduled to see his primary care about this next week.  He is otherwise taking Hyzaar for his blood pressure.  Colonoscopy 03/2010 - 2 x 5mm adenomas Colonoscopy 01/2014 - no polyps, diverticulosis, internal hemorrhoids   Past Medical History:  Diagnosis Date  . Allergic rhinitis   . Arthritis   . Cancer (HCC)    basal cell nose  . Carotid atherosclerosis, bilateral    dvt  ?14  . DVT (deep venous thrombosis) ([place]) 11/22/2012  . GERD (gastroesophageal reflux disease)    occ  . High blood pressure   . Hypercholesteremia   . Hyperlipidemia   . Left atrial dilatation   . LVH (left ventricular  hypertrophy)   . Obesity   . Seizures ([place])    "in 20's from aspirin"     Past Surgical History:  Procedure Laterality Date  . COLONOSCOPY  2015  . LUMBAR LAMINECTOMY/DECOMPRESSION MICRODISCECTOMY Left 11/30/2016   Procedure: LEFT LUMBAR FOUR-FIVE LAMINECTOMY FOR FACET/SYNOVIAL CYST;  Surgeon: Ashok Pall, MD;  Location: [facility];  Service: Neurosurgery;  Laterality: Left;  LAMINECTOMY FOR FACET/SYNOVIAL CYST LUMBAR 4-LUMBAR 5, LEFT  . right leg fracture s/p surgical repair with screws Right 1966  . SKIN CANCER EXCISION  2010   from his nose, no recurrence  . TOTAL KNEE ARTHROPLASTY Right 2015   Family History  Problem Relation Age of Onset  . Alzheimer's disease Mother   . Cancer Mother        Skin  . Alzheimer's disease Father   . Cancer Father        colon, liver, skin  . Colon polyps Father   . Colon cancer Father    Social History   Tobacco Use  . Smoking status: Former Smoker    Packs/day: 1.00    Years: 20.00    Pack years: 20.00    Types: Cigarettes    Quit date: 11/27/1988    Years since quitting: 30.4  . Smokeless tobacco: Never Used  Substance Use Topics  .  Alcohol use: No  . Drug use: No   Current Outpatient Medications  Medication Sig Dispense Refill  . acetaminophen (TYLENOL) 500 MG tablet Take 500 mg by mouth at bedtime as needed for moderate pain.     Marland Kitchen atorvastatin (LIPITOR) 20 MG tablet TAKE 1 TABLET BY MOUTH  DAILY 90 tablet 1  . calcium carbonate (TUMS - DOSED IN MG ELEMENTAL CALCIUM) 500 MG chewable tablet Chew 1 tablet by mouth as needed for indigestion or heartburn.    . Cholecalciferol (VITAMIN D3) 2000 units TABS Take 2,000 Units by mouth every evening.     Marland Kitchen losartan-hydrochlorothiazide (HYZAAR) 100-12.5 MG tablet TAKE 1 TABLET BY MOUTH  DAILY 90 tablet 3  . naproxen sodium (ALEVE) 220 MG tablet Take 220 mg by mouth daily as needed (pain).    . tamsulosin (FLOMAX) 0.4 MG CAPS capsule Take 0.4 mg by mouth daily.     No current  facility-administered medications for this visit.   Allergies  Allergen Reactions  . Asa [Aspirin] Other (See Comments)    Seizures      Review of Systems: All systems reviewed and negative except where noted in HPI.   Lab Results  Component Value Date   WBC 9.8 03/12/2018   HGB 14.8 03/12/2018   HCT 43 03/12/2018   MCV 95.6 11/27/2016   PLT 419 (A) 03/12/2018     Physical Exam: BP 100/68   Pulse 72   Temp (!) 97.3 F (36.3 C)   Ht 5\' 11"  (1.803 m)   Wt 214 lb 9.6 oz (97.3 kg)   BMI 29.93 kg/m  Constitutional: Pleasant,well-developed, male in no acute distress. Lymphadenopathy: No cervical adenopathy noted. Neurological: Alert and oriented to person place and time. Psychiatric: Normal mood and affect. Behavior is normal.   ASSESSMENT AND PLAN: 74 year old male with history as outlined above, here to discuss the following:  History of colon polyps / HTN - as above, patient is hoping to proceed with colonoscopy at this time.  I did review again his family history with him and the results of his prior colonoscopies.  His father's age at the diagnosis of his colon cancer should not influence him at this point in his life.  Surveillance should be based on the results of his most recent exams and history of polyps.  Per triservice guidelines he is not due for 10 years after his last exam (2025), however the patient is very uncomfortable waiting that long, especially after he states other GI physicians have told him he needs exams much more frequently.  We mutually agreed to perform the exam now, about 5 years after his last exam to give him piece of mind. If this exam is normal he may not need any further exams.  We discussed risk and benefits of colonoscopy and anesthesia and he want to proceed.  That being said, he has some relative hypotension on the new regimen he is on after treatment of his prostate.  I think you may want to back off his Hyzaar and hold the dose and see how he  is feeling.  He will touch base with his primary care about this next week regarding long-term management.  He would prefer to schedule his colonoscopy in later February.  Further recommendations pending results and his course.  I spent 22 minutes of time, including independent review of results as outlined above, communicating results with the patient directly, face-to-face time with the patient, coordinating care, ordering studies and medications as appropriate, and  documenting this encounter.     Ellinwood Cellar, MD [facility] Pager [phone]

## 2019-05-02 NOTE — Patient Instructions (Signed)
If you are age 72 or older, your body mass index should be between 23-30. Your Body mass index is 29.93 kg/m. If this is out of the aforementioned range listed, please consider follow up with your Primary Care Provider.  If you are age 63 or younger, your body mass index should be between 19-25. Your Body mass index is 29.93 kg/m. If this is out of the aformentioned range listed, please consider follow up with your Primary Care Provider.   You have been scheduled for a colonoscopy. Please follow written instructions given to you at your visit today.  Please pick up your prep supplies at the pharmacy within the next 1-3 days. If you use inhalers (even only as needed), please bring them with you on the day of your procedure. Your physician has requested that you go to www.startemmi.com and enter the access code given to you at your visit today. This web site gives a general overview about your procedure. However, you should still follow specific instructions given to you by our office regarding your preparation for the procedure.  Thank you for entrusting me with your care and for choosing [facility], Dr. Kuttawa Cellar

## 2019-05-05 ENCOUNTER — Other Ambulatory Visit: Payer: Self-pay | Admitting: Internal Medicine

## 2019-05-05 ENCOUNTER — Ambulatory Visit (INDEPENDENT_AMBULATORY_CARE_PROVIDER_SITE_OTHER): Payer: Medicare Other | Admitting: Internal Medicine

## 2019-05-05 ENCOUNTER — Encounter: Payer: Self-pay | Admitting: Internal Medicine

## 2019-05-05 ENCOUNTER — Other Ambulatory Visit: Payer: Self-pay

## 2019-05-05 VITALS — BP 110/62 | HR 93 | Temp 97.5°F | Ht 71.0 in | Wt 218.0 lb

## 2019-05-05 DIAGNOSIS — R399 Unspecified symptoms and signs involving the genitourinary system: Secondary | ICD-10-CM | POA: Insufficient documentation

## 2019-05-05 DIAGNOSIS — I10 Essential (primary) hypertension: Secondary | ICD-10-CM

## 2019-05-05 DIAGNOSIS — R634 Abnormal weight loss: Secondary | ICD-10-CM | POA: Diagnosis not present

## 2019-05-05 DIAGNOSIS — I951 Orthostatic hypotension: Secondary | ICD-10-CM | POA: Insufficient documentation

## 2019-05-05 MED ORDER — LOSARTAN POTASSIUM 100 MG PO TABS: 100.0000 mg | ORAL_TABLET | Freq: Every day | ORAL | 0 refills | Status: DC

## 2019-05-05 MED ORDER — LOSARTAN POTASSIUM 100 MG PO TABS: 100.0000 mg | ORAL_TABLET | Freq: Every day | ORAL | 3 refills | Status: DC

## 2019-05-05 NOTE — Progress Notes (Signed)
Location:  [facility] clinic  Provider: Dr. Hollace Kinnier   Goals of Care:  Advanced Directives 05/05/2019  Does Patient Have a Medical Advance Directive? Yes  Type of Advance Directive Out of facility DNR (pink MOST or yellow form)  Does patient want to make changes to medical advance directive? No - Patient declined  Copy of [place] in Chart? -  Would patient like information on creating a medical advance directive? -     Chief Complaint  Patient presents with  . Acute Visit    Low blood pressure , Dizzy, discuss meds (Flomax)    HPI: Patient is a 74 y.o. male seen today for medical management of chronic diseases.    One month ago, his urologist started him on Flomax. Last Thursday during his f/u, he was dizzy in the waiting room. Staff took a manual bp and his SBP was in the 60's. He was advised to take his losartan/HCTZ every other day and f/u with PCP.   He has had other incidents of feeling dizziness/ lightheadedness over the past month. Changing positions stimulates lightheadedness or dizziness. Denies chest pain or shortness of breath.   No recent injuries or falls.    He is pleased with the flomax and would like to continue this medication.   Colonoscopy scheduled 06/01/2019.   Has received his first covid vaccine. Second dose scheduled 05/14/2019.   Past Medical History:  Diagnosis Date  . Allergic rhinitis   . Arthritis   . Cancer (HCC)    basal cell nose  . Carotid atherosclerosis, bilateral    dvt  ?14  . DVT (deep venous thrombosis) ([place]) 11/22/2012  . GERD (gastroesophageal reflux disease)    occ  . High blood pressure   . Hypercholesteremia   . Hyperlipidemia   . Left atrial dilatation   . LVH (left ventricular hypertrophy)   . Obesity   . Seizures ([place])    "in 20's from aspirin"    Past Surgical History:  Procedure Laterality Date  . COLONOSCOPY  2015  . LUMBAR LAMINECTOMY/DECOMPRESSION MICRODISCECTOMY Left 11/30/2016   Procedure: LEFT LUMBAR FOUR-FIVE LAMINECTOMY FOR FACET/SYNOVIAL CYST;  Surgeon: Ashok Pall, MD;  Location: [place];  Service: Neurosurgery;  Laterality: Left;  LAMINECTOMY FOR FACET/SYNOVIAL CYST LUMBAR 4-LUMBAR 5, LEFT  . right leg fracture s/p surgical repair with screws Right 1966  . SKIN CANCER EXCISION  2010   from his nose, no recurrence  . TOTAL KNEE ARTHROPLASTY Right 2015    Allergies  Allergen Reactions  . Asa [Aspirin] Other (See Comments)    Seizures     Outpatient Encounter Medications as of 05/05/2019  Medication Sig  . acetaminophen (TYLENOL) 500 MG tablet Take 500 mg by mouth at bedtime as needed for moderate pain.   Marland Kitchen atorvastatin (LIPITOR) 20 MG tablet TAKE 1 TABLET BY MOUTH  DAILY  . calcium carbonate (TUMS - DOSED IN MG ELEMENTAL CALCIUM) 500 MG chewable tablet Chew 1 tablet by mouth as needed for indigestion or heartburn.  . Cholecalciferol (VITAMIN D3) 2000 units TABS Take 2,000 Units by mouth every evening.   Marland Kitchen losartan-hydrochlorothiazide (HYZAAR) 100-12.5 MG tablet TAKE 1 TABLET BY MOUTH  DAILY  . naproxen sodium (ALEVE) 220 MG tablet Take 220 mg by mouth daily as needed (pain).  Manus Gunning BOWEL PREP KIT 17.5-3.13-1.6 GM/177ML SOLN Suprep-Use as directed  . tamsulosin (FLOMAX) 0.4 MG CAPS capsule Take 0.4 mg by mouth daily.   No facility-administered encounter medications on file as  of 05/05/2019.    Review of Systems:  Review of Systems  Constitutional: Negative for activity change, appetite change and fatigue.  Respiratory: Negative for cough and shortness of breath.   Cardiovascular: Negative for chest pain, palpitations and leg swelling.  Genitourinary: Negative for dysuria, frequency and hematuria.  Neurological: Positive for dizziness and light-headedness.    Health Maintenance  Topic Date Due  . Hepatitis C Screening  Dec 15, 1945  . TETANUS/TDAP  04/04/2023  . COLONOSCOPY  01/31/2024  . INFLUENZA VACCINE  Completed  . PNA vac Low Risk Adult   Completed    Physical Exam: Vitals:   05/05/19 0821 05/05/19 0834 05/05/19 0835  BP: 114/78 118/78 110/62  Pulse: 80 80 93  Temp: (!) 97.5 F (36.4 C)    SpO2: 93% 95% 97%  Weight: 218 lb (98.9 kg)    Height: '5\' 11"'$  (1.803 m)     Body mass index is 30.4 kg/m. Physical Exam Vitals reviewed.  Constitutional:      Appearance: Normal appearance. He is normal weight.  Cardiovascular:     Rate and Rhythm: Normal rate and regular rhythm.     Pulses: Normal pulses.     Heart sounds: Normal heart sounds.  Pulmonary:     Effort: Pulmonary effort is normal. No respiratory distress.     Breath sounds: Normal breath sounds. No wheezing.  Abdominal:     General: Bowel sounds are normal.     Palpations: Abdomen is soft.  Musculoskeletal:     Right lower leg: No edema.     Left lower leg: No edema.  Neurological:     General: No focal deficit present.     Mental Status: He is alert and oriented to person, place, and time. Mental status is at baseline.  Psychiatric:        Mood and Affect: Mood normal.        Behavior: Behavior normal.        Thought Content: Thought content normal.        Judgment: Judgment normal.     Labs reviewed: Basic Metabolic Panel: Recent Labs    08/01/18 0400  NA 145  K 4.5  BUN 12  CREATININE 0.9   Liver Function Tests: No results for input(s): AST, ALT, ALKPHOS, BILITOT, PROT, ALBUMIN in the last 8760 hours. No results for input(s): LIPASE, AMYLASE in the last 8760 hours. No results for input(s): AMMONIA in the last 8760 hours. CBC: No results for input(s): WBC, NEUTROABS, HGB, HCT, MCV, PLT in the last 8760 hours. Lipid Panel: Recent Labs    08/01/18 0400  CHOL 141  HDL 38  LDLCALC 79  TRIG 120   Lab Results  Component Value Date   HGBA1C 6.4 08/01/2018    Procedures since last visit: No results found.  Assessment/Plan 1. Orthostatic hypotension - suspect Flomax has depleted volume - will discontinue diuretic to reduce  volume depletion - start losartan 100 mg po daily - please take blood pressure for next 2 weeks - contact PCP is dizzinezz does not resolve or if blood pressure remains SBP<100.   2. Essential hypertension, benign - recent weight loss and starting flomax have made patient orthostatic - will discontinue HCTZ - start losartan 100 mg po daily - record blood pressures for next week - continue low sodium diet  3. Lower urinary tract symptoms (LUTS) - followed by urology - flomax has reduced urinary frequency - continue flomax regimen  4. Weight loss - has lost about 20  pounds since August 2020 - bp has been lower with weight loss, will discontinue diuretic  - continue to exercise 150 min/week - continue to limit calories      Labs/tests ordered:  None Next appt:  07/23/2019

## 2019-05-14 DIAGNOSIS — Z23 Encounter for immunization: Secondary | ICD-10-CM | POA: Diagnosis not present

## 2019-05-28 ENCOUNTER — Encounter: Payer: Self-pay | Admitting: Gastroenterology

## 2019-05-28 ENCOUNTER — Ambulatory Visit (AMBULATORY_SURGERY_CENTER): Payer: Medicare Other | Admitting: Gastroenterology

## 2019-05-28 ENCOUNTER — Other Ambulatory Visit: Payer: Self-pay

## 2019-05-28 ENCOUNTER — Encounter: Payer: Medicare Other | Admitting: Internal Medicine

## 2019-05-28 VITALS — BP 94/55 | HR 73 | Temp 97.7°F | Resp 12 | Ht 71.0 in | Wt 214.0 lb

## 2019-05-28 DIAGNOSIS — D123 Benign neoplasm of transverse colon: Secondary | ICD-10-CM | POA: Diagnosis not present

## 2019-05-28 DIAGNOSIS — D127 Benign neoplasm of rectosigmoid junction: Secondary | ICD-10-CM | POA: Diagnosis not present

## 2019-05-28 DIAGNOSIS — D120 Benign neoplasm of cecum: Secondary | ICD-10-CM

## 2019-05-28 DIAGNOSIS — K621 Rectal polyp: Secondary | ICD-10-CM

## 2019-05-28 DIAGNOSIS — Z86010 Personal history of colonic polyps: Secondary | ICD-10-CM | POA: Diagnosis not present

## 2019-05-28 DIAGNOSIS — D128 Benign neoplasm of rectum: Secondary | ICD-10-CM

## 2019-05-28 DIAGNOSIS — K635 Polyp of colon: Secondary | ICD-10-CM | POA: Diagnosis not present

## 2019-05-28 DIAGNOSIS — D125 Benign neoplasm of sigmoid colon: Secondary | ICD-10-CM

## 2019-05-28 MED ORDER — SODIUM CHLORIDE 0.9 % IV SOLN: 500.0000 mL | Freq: Once | INTRAVENOUS | Status: DC

## 2019-05-28 NOTE — Progress Notes (Signed)
Called to room to assist during endoscopic procedure.  Patient ID and intended procedure confirmed with present staff. Received instructions for my participation in the procedure from the performing physician.  

## 2019-05-28 NOTE — Progress Notes (Signed)
Pt Drowsy. VSS. To PACU, report to RN. No anesthetic complications noted.  

## 2019-05-28 NOTE — Progress Notes (Signed)
Temp  LC  VS  DT  Pt's states no medical or surgical changes since previsit or office visit.    

## 2019-05-28 NOTE — Patient Instructions (Signed)
Please, read all of the handouts given to you by your recovery room nurse.  Thank-you for choosing [place] for your healthcare needs today.  YOU HAD AN ENDOSCOPIC PROCEDURE TODAY AT [facility]:   Refer to the procedure report that was given to you for any specific questions about what was found during the examination.  If the procedure report does not answer your questions, please call your gastroenterologist to clarify.  If you requested that your care partner not be given the details of your procedure findings, then the procedure report has been included in a sealed envelope for you to review at your convenience later.  YOU SHOULD EXPECT: Some feelings of bloating in the abdomen. Passage of more gas than usual.  Walking can help get rid of the air that was put into your GI tract during the procedure and reduce the bloating. If you had a lower endoscopy (such as a colonoscopy or flexible sigmoidoscopy) you may notice spotting of blood in your stool or on the toilet paper. If you underwent a bowel prep for your procedure, you may not have a normal bowel movement for a few days.  Please Note:  You might notice some irritation and congestion in your nose or some drainage.  This is from the oxygen used during your procedure.  There is no need for concern and it should clear up in a day or so.  SYMPTOMS TO REPORT IMMEDIATELY:   Following lower endoscopy (colonoscopy or flexible sigmoidoscopy):  Excessive amounts of blood in the stool  Significant tenderness or worsening of abdominal pains  Swelling of the abdomen that is new, acute  Fever of 100F or higher   For urgent or emergent issues, a gastroenterologist can be reached at any hour by calling [phone].   DIET:  We do recommend a small meal at first, but then you may proceed to your regular diet.  Drink plenty of fluids but you should avoid alcoholic beverages for 24 hours. Try to increase the fiber in your diet, and drink  plenty of water.  ACTIVITY:  You should plan to take it easy for the rest of today and you should NOT DRIVE or use heavy machinery until tomorrow (because of the sedation medicines used during the test).    FOLLOW UP: Our staff will call the number listed on your records 48-72 hours following your procedure to check on you and address any questions or concerns that you may have regarding the information given to you following your procedure. If we do not reach you, we will leave a message.  We will attempt to reach you two times.  During this call, we will ask if you have developed any symptoms of COVID 19. If you develop any symptoms (ie: fever, flu-like symptoms, shortness of breath, cough etc.) before then, please call [phone].  If you test positive for Covid 19 in the 2 weeks post procedure, please call and report this information to [place].    If any biopsies were taken you will be contacted by phone or by letter within the next 1-3 weeks.  Please call us at [phone] if you have not heard about the biopsies in 3 weeks.    SIGNATURES/CONFIDENTIALITY: You and/or your care partner have signed paperwork which will be entered into your electronic medical record.  These signatures attest to the fact that that the information above on your After Visit Summary has been reviewed and is understood.  Full responsibility of the  confidentiality of this discharge information lies with you and/or your care-partner. 

## 2019-05-28 NOTE — Op Note (Signed)
[facility] Patient Name: Colton Hart Procedure Date: 05/28/2019 12:02 PM MRN: [id number] Endoscopist: Colton Hart , MD Age: 74 Referring MD:  Date of Birth: 08-12-1945 Gender: Male Account #: [id number] Procedure:                Colonoscopy Indications:              Screening in patient at increased risk: Family                            history of 1st-degree relative with colorectal                            cancer (diagnosed age 34s), remote history of colon                            polyps Medicines:                Monitored Anesthesia Care Procedure:                Pre-Anesthesia Assessment:                           - Prior to the procedure, a History and Physical                            was performed, and patient medications and                            allergies were reviewed. The patient's tolerance of                            previous anesthesia was also reviewed. The risks                            and benefits of the procedure and the sedation                            options and risks were discussed with the patient.                            All questions were answered, and informed consent                            was obtained. Prior Anticoagulants: The patient has                            taken no previous anticoagulant or antiplatelet                            agents. ASA Grade Assessment: III - A patient with                            severe systemic disease. After reviewing the risks  and benefits, the patient was deemed in                            satisfactory condition to undergo the procedure.                           After obtaining informed consent, the colonoscope                            was passed under direct vision. Throughout the                            procedure, the patient's blood pressure, pulse, and                            oxygen saturations were monitored continuously. The                             Colonoscope was introduced through the anus and                            advanced to the the cecum, identified by                            appendiceal orifice and ileocecal valve. The                            colonoscopy was performed without difficulty. The                            patient tolerated the procedure well. The quality                            of the bowel preparation was adequate. The                            ileocecal valve, appendiceal orifice, and rectum                            were photographed. Scope In: 12:07:37 PM Scope Out: 12:30:45 PM Scope Withdrawal Time: 0 hours 17 minutes 59 seconds  Total Procedure Duration: 0 hours 23 minutes 8 seconds  Findings:                 The perianal and digital rectal examinations were                            normal.                           A 5 mm polyp was found in the cecum. The polyp was                            flat. The polyp was removed with a cold snare.  Resection and retrieval were complete.                           A 4 mm polyp was found in the transverse colon. The                            polyp was sessile. The polyp was removed with a                            cold snare. Resection and retrieval were complete.                           A 3 mm polyp was found in the sigmoid colon. The                            polyp was sessile. The polyp was removed with a                            cold snare. Resection and retrieval were complete.                           A 3 mm polyp was found in the rectum. The polyp was                            sessile. The polyp was removed with a cold snare.                            Resection and retrieval were complete.                           Multiple small-mouthed diverticula were found in                            the transverse colon and left colon.                           Internal hemorrhoids were found  during retroflexion.                           The exam was otherwise without abnormality. Complications:            No immediate complications. Estimated blood loss:                            Minimal. Estimated Blood Loss:     Estimated blood loss was minimal. Impression:               - One 5 mm polyp in the cecum, removed with a cold                            snare. Resected and retrieved.                           - One 4  mm polyp in the transverse colon, removed                            with a cold snare. Resected and retrieved.                           - One 3 mm polyp in the sigmoid colon, removed with                            a cold snare. Resected and retrieved.                           - One 3 mm polyp in the rectum, removed with a cold                            snare. Resected and retrieved.                           - Diverticulosis in the transverse colon and in the                            left colon.                           - Internal hemorrhoids.                           - The examination was otherwise normal. Recommendation:           - Patient has a contact number available for                            emergencies. The signs and symptoms of potential                            delayed complications were discussed with the                            patient. Return to normal activities tomorrow.                            Written discharge instructions were provided to the                            patient.                           - Resume previous diet.                           - Continue present medications.                           - Await pathology results. Colton Lipps P. Havery Moros, MD 05/28/2019 12:36:58 PM This report has been signed electronically.

## 2019-05-30 ENCOUNTER — Telehealth: Payer: Self-pay | Admitting: *Deleted

## 2019-05-30 NOTE — Telephone Encounter (Signed)
  Follow up Call-  Call back number 05/28/2019  Post procedure Call Back phone  # [phone]  Permission to leave phone message Yes  Some recent data might be hidden     Patient questions:  Do you have a fever, pain , or abdominal swelling? No. Pain Score  0 *  Have you tolerated food without any problems? Yes.    Have you been able to return to your normal activities? Yes.    Do you have any questions about your discharge instructions: Diet   No. Medications  No. Follow up visit  No.  Do you have questions or concerns about your Care? No.  Actions: * If pain score is 4 or above: No action needed, pain <4.  1. Have you developed a fever since your procedure? No  2.   Have you had an respiratory symptoms (SOB or cough) since your procedure? no  3.   Have you tested positive for COVID 19 since your procedure no  4.   Have you had any family members/close contacts diagnosed with the COVID 19 since your procedure?  no   If yes to any of these questions please route to Joylene John, RN and Alphonsa Gin, Therapist, sports.

## 2019-07-15 DIAGNOSIS — D649 Anemia, unspecified: Secondary | ICD-10-CM | POA: Diagnosis not present

## 2019-07-15 DIAGNOSIS — E119 Type 2 diabetes mellitus without complications: Secondary | ICD-10-CM | POA: Diagnosis not present

## 2019-07-15 DIAGNOSIS — I10 Essential (primary) hypertension: Secondary | ICD-10-CM | POA: Diagnosis not present

## 2019-07-15 DIAGNOSIS — R739 Hyperglycemia, unspecified: Secondary | ICD-10-CM | POA: Diagnosis not present

## 2019-07-15 LAB — CBC AND DIFFERENTIAL
HCT: 45 (ref 41–53)
Hemoglobin: 15.1 (ref 13.5–17.5)
Platelets: 226 (ref 150–399)
WBC: 6.8

## 2019-07-15 LAB — CBC: RBC: 4.6 (ref 3.87–5.11)

## 2019-07-15 LAB — HEMOGLOBIN A1C: Hemoglobin A1C: 5.6

## 2019-07-16 ENCOUNTER — Encounter: Payer: Self-pay | Admitting: Internal Medicine

## 2019-07-23 ENCOUNTER — Encounter: Payer: Self-pay | Admitting: Internal Medicine

## 2019-07-23 ENCOUNTER — Other Ambulatory Visit: Payer: Self-pay

## 2019-07-23 ENCOUNTER — Non-Acute Institutional Stay: Payer: Medicare Other | Admitting: Internal Medicine

## 2019-07-23 VITALS — BP 118/70 | HR 78 | Temp 97.8°F | Ht 71.0 in | Wt 222.0 lb

## 2019-07-23 DIAGNOSIS — M159 Polyosteoarthritis, unspecified: Secondary | ICD-10-CM | POA: Diagnosis not present

## 2019-07-23 DIAGNOSIS — R739 Hyperglycemia, unspecified: Secondary | ICD-10-CM | POA: Diagnosis not present

## 2019-07-23 DIAGNOSIS — I10 Essential (primary) hypertension: Secondary | ICD-10-CM | POA: Diagnosis not present

## 2019-07-23 DIAGNOSIS — R399 Unspecified symptoms and signs involving the genitourinary system: Secondary | ICD-10-CM | POA: Diagnosis not present

## 2019-07-23 DIAGNOSIS — G250 Essential tremor: Secondary | ICD-10-CM

## 2019-07-23 DIAGNOSIS — Z7189 Other specified counseling: Secondary | ICD-10-CM | POA: Diagnosis not present

## 2019-07-23 NOTE — Progress Notes (Signed)
Location:  Occupational psychologist of Service:  Clinic (12)  Provider: Calleen Alvis L. Mariea Clonts, D.O., C.M.D.  Code Status: DNR, MOST done today and uploaded by social worker  Goals of Care:  Advanced Directives 07/23/2019  Does Patient Have a Medical Advance Directive? Yes  Type of Advance Directive Out of facility DNR (pink MOST or yellow form)  Does patient want to make changes to medical advance directive? No - Patient declined  Copy of [place] in Chart? -  Would patient like information on creating a medical advance directive? -     Chief Complaint  Patient presents with  . Medical Management of Chronic Issues    4 month follow up go and  over most form     HPI: Patient is a 74 y.o. male seen today for medical management of chronic diseases--4 month f/u and MOST form.  He has one new thing.  He was having problems with flomax.  When going to bed, he was getting something halfway b/w a sugar high and a panic attack.  He felt real tense, but not really gonna pass out.  Might be queasy.  He's stopped taking it.    BP was fine this am.  .    Fingers are stiff--trigger finger on right comes and goes.  Hips and shoulders doing fine right now.  Tremor--about the same.    hba1c 5.6 down from 6.4 a year ago.  They are back to eating in the bistro and desserts are hard to say no to when brought to the table.  His weight is up to 222 but he had reached 214 last time.  He's not walking.    Past Medical History:  Diagnosis Date  . Allergic rhinitis   . Arthritis   . Cancer (HCC)    basal cell nose  . Carotid atherosclerosis, bilateral    dvt  ?14  . DVT (deep venous thrombosis) ([place]) 11/22/2012  . GERD (gastroesophageal reflux disease)    occ  . High blood pressure   . Hypercholesteremia   . Hyperlipidemia   . Left atrial dilatation   . LVH (left ventricular hypertrophy)   . Obesity   . Seizures ([place])    "in 20's from aspirin"    Past  Surgical History:  Procedure Laterality Date  . COLONOSCOPY  2015  . LUMBAR LAMINECTOMY/DECOMPRESSION MICRODISCECTOMY Left 11/30/2016   Procedure: LEFT LUMBAR FOUR-FIVE LAMINECTOMY FOR FACET/SYNOVIAL CYST;  Surgeon: Ashok Pall, MD;  Location: [place];  Service: Neurosurgery;  Laterality: Left;  LAMINECTOMY FOR FACET/SYNOVIAL CYST LUMBAR 4-LUMBAR 5, LEFT  . right leg fracture s/p surgical repair with screws Right 1966  . SKIN CANCER EXCISION  2010   from his nose, no recurrence  . TOTAL KNEE ARTHROPLASTY Right 2015    Allergies  Allergen Reactions  . Asa [Aspirin] Other (See Comments)    Seizures     Outpatient Encounter Medications as of 07/23/2019  Medication Sig  . acetaminophen (TYLENOL) 500 MG tablet Take 500 mg by mouth at bedtime as needed for moderate pain.   Marland Kitchen atorvastatin (LIPITOR) 20 MG tablet TAKE 1 TABLET BY MOUTH  DAILY  . calcium carbonate (TUMS - DOSED IN MG ELEMENTAL CALCIUM) 500 MG chewable tablet Chew 1 tablet by mouth as needed for indigestion or heartburn.  . Cholecalciferol (VITAMIN D3) 2000 units TABS Take 2,000 Units by mouth every evening.   Marland Kitchen losartan (COZAAR) 100 MG tablet TAKE 1 TABLET(100 MG) BY MOUTH  DAILY  . naproxen sodium (ALEVE) 220 MG tablet Take 220 mg by mouth daily as needed (pain).  . tamsulosin (FLOMAX) 0.4 MG CAPS capsule Take 0.4 mg by mouth daily.   Facility-Administered Encounter Medications as of 07/23/2019  Medication  . 0.9 %  sodium chloride infusion    Review of Systems:  Review of Systems  Constitutional: Negative for chills and fever.  HENT: Negative for congestion and sore throat.   Eyes:       Glasses  Respiratory: Negative for cough and shortness of breath.   Cardiovascular: Negative for chest pain, palpitations and leg swelling.  Gastrointestinal: Negative for abdominal pain, blood in stool, constipation, diarrhea and melena.  Genitourinary: Negative for dysuria.  Musculoskeletal: Positive for joint pain. Negative for falls  and myalgias.  Skin: Negative for rash.  Neurological: Positive for tremors. Negative for dizziness and loss of consciousness.  Endo/Heme/Allergies: Does not bruise/bleed easily.  Psychiatric/Behavioral: Negative for depression and memory loss. The patient is not nervous/anxious and does not have insomnia.     Health Maintenance  Topic Date Due  . Hepatitis C Screening  Never done  . COVID-19 Vaccine (1) Never done  . INFLUENZA VACCINE  11/02/2019  . TETANUS/TDAP  04/04/2023  . COLONOSCOPY  05/27/2029  . PNA vac Low Risk Adult  Completed    Physical Exam: Vitals:   07/23/19 0830  BP: 118/70  Pulse: 78  Temp: 97.8 F (36.6 C)  SpO2: 97%  Weight: 222 lb (100.7 kg)  Height: 5\' 11"  (1.803 m)   Body mass index is 30.96 kg/m. Physical Exam Vitals reviewed.  Constitutional:      General: He is not in acute distress.    Appearance: Normal appearance. He is not toxic-appearing.  HENT:     Head: Normocephalic and atraumatic.  Eyes:     Comments: glasses  Cardiovascular:     Rate and Rhythm: Normal rate and regular rhythm.  Pulmonary:     Effort: Pulmonary effort is normal.     Breath sounds: Normal breath sounds.  Abdominal:     General: Bowel sounds are normal.  Musculoskeletal:        General: Normal range of motion.     Right lower leg: No edema.     Left lower leg: No edema.  Skin:    General: Skin is warm and dry.  Neurological:     General: No focal deficit present.     Mental Status: He is alert and oriented to person, place, and time.     Comments: Fast tremor of hands  Psychiatric:        Mood and Affect: Mood normal.        Behavior: Behavior normal.        Thought Content: Thought content normal.        Judgment: Judgment normal.     Labs reviewed: Basic Metabolic Panel: Recent Labs    08/01/18 0400  NA 145  K 4.5  BUN 12  CREATININE 0.9   Liver Function Tests: No results for input(s): AST, ALT, ALKPHOS, BILITOT, PROT, ALBUMIN in the last  8760 hours. No results for input(s): LIPASE, AMYLASE in the last 8760 hours. No results for input(s): AMMONIA in the last 8760 hours. CBC: Recent Labs    07/15/19 0757  WBC 6.8  HGB 15.1  HCT 45  PLT 226   Lipid Panel: Recent Labs    08/01/18 0400  CHOL 141  HDL 38  LDLCALC 79  TRIG 120  Lab Results  Component Value Date   HGBA1C 5.6 07/15/2019    Procedures since last visit: No results found.  Assessment/Plan 1. Hyperglycemia -hba1c trended down amid covid when eating less and avoiding desserts with takeout, but has been having desserts again when served and weight has already gone back up some -counseled to avoid desserts more often to avoid further weight gain--he admits that these visits help his willpower for a period of time and then it weans  2. Benign essential tremor -ongoing, certainly not getting better, most bothersome with writing; does not request any med change  3. Lower urinary tract symptoms (LUTS) -has stopped his flomax due to some odd sensation he was having -is passing urine ok at this point  4. Essential hypertension, benign -bp at goal with current therapy, cont same regimen and monitor  5. Generalized osteoarthritis of multiple sites -doing well right now  6. ACP (advance care planning) -MOST done today--20 mins spent on discussion -he was viewing this in his current state NOT end-of-life so we will need to review and update annually if his health declines  Labs/tests ordered:   Bmp, hba1c, flp before  Next appt:  11/26/2019  Essam Lowdermilk L. Roise Emert, D.O. [facility] [address], [place] [address] Cell Phone (Mon-Fri 8am-5pm):  [phone] On Call:  [phone] & follow prompts after 5pm & weekends Office Phone:  [phone] Office Fax:  [phone]

## 2019-08-01 DIAGNOSIS — H4912 Fourth [trochlear] nerve palsy, left eye: Secondary | ICD-10-CM | POA: Diagnosis not present

## 2019-08-01 DIAGNOSIS — H2513 Age-related nuclear cataract, bilateral: Secondary | ICD-10-CM | POA: Diagnosis not present

## 2019-08-01 DIAGNOSIS — H532 Diplopia: Secondary | ICD-10-CM | POA: Diagnosis not present

## 2019-08-21 DIAGNOSIS — H2512 Age-related nuclear cataract, left eye: Secondary | ICD-10-CM | POA: Diagnosis not present

## 2019-08-21 DIAGNOSIS — H25812 Combined forms of age-related cataract, left eye: Secondary | ICD-10-CM | POA: Diagnosis not present

## 2019-10-02 DIAGNOSIS — H25811 Combined forms of age-related cataract, right eye: Secondary | ICD-10-CM | POA: Diagnosis not present

## 2019-10-02 DIAGNOSIS — H2511 Age-related nuclear cataract, right eye: Secondary | ICD-10-CM | POA: Diagnosis not present

## 2019-10-03 ENCOUNTER — Other Ambulatory Visit: Payer: Self-pay | Admitting: Internal Medicine

## 2019-10-03 DIAGNOSIS — E7800 Pure hypercholesterolemia, unspecified: Secondary | ICD-10-CM

## 2019-10-03 DIAGNOSIS — G250 Essential tremor: Secondary | ICD-10-CM

## 2019-10-07 ENCOUNTER — Encounter: Payer: Self-pay | Admitting: Internal Medicine

## 2019-10-07 NOTE — Telephone Encounter (Signed)
It appears Dr.Reed has approved both rx's I will call [facility] to cancel rx for Inderal.   I have removed rx from patients medication list

## 2019-10-07 NOTE — Telephone Encounter (Signed)
I believe he has labs at [facility] before his appt in August.  Ok to fill these.  His tremor must be bothering him more so he's resumed the propranolol.

## 2019-10-07 NOTE — Telephone Encounter (Signed)
I called the pharmacy and spoke with the pharmacist to cancel rx. Per the pharmacy she will make a notation and try to cancel yet it is still a possibility that rx will be sent for inderal.

## 2019-10-07 NOTE — Telephone Encounter (Signed)
Patient is asking for refill on 2 medications "Atorvastatin 20mg " and "Propanolol 10mg ". Last Lipid Panel was 08/01/2018. Last visit was at [facility] on 07/23/2019.  Patient had "Propanolol 10mg " discontinued 11/27/2018. Patient has upcoming appointment 11/26/2019. Medications pend and sent to PCP Mariea Clonts, Tiffany L, DO. Please Advise.

## 2019-11-20 ENCOUNTER — Encounter: Payer: Self-pay | Admitting: Internal Medicine

## 2019-11-20 DIAGNOSIS — E7800 Pure hypercholesterolemia, unspecified: Secondary | ICD-10-CM | POA: Diagnosis not present

## 2019-11-20 DIAGNOSIS — R739 Hyperglycemia, unspecified: Secondary | ICD-10-CM | POA: Diagnosis not present

## 2019-11-20 DIAGNOSIS — I10 Essential (primary) hypertension: Secondary | ICD-10-CM | POA: Diagnosis not present

## 2019-11-20 DIAGNOSIS — E119 Type 2 diabetes mellitus without complications: Secondary | ICD-10-CM | POA: Diagnosis not present

## 2019-11-20 LAB — LIPID PANEL
Cholesterol: 138 (ref 0–200)
HDL: 40 (ref 35–70)
LDL Cholesterol: 75
Triglycerides: 116 (ref 40–160)

## 2019-11-20 LAB — COMPREHENSIVE METABOLIC PANEL: Calcium: 9.4 (ref 8.7–10.7)

## 2019-11-20 LAB — BASIC METABOLIC PANEL
BUN: 13 (ref 4–21)
Chloride: 106 (ref 99–108)
Creatinine: 0.8 (ref 0.6–1.3)
Glucose: 131
Potassium: 4.5 (ref 3.4–5.3)
Sodium: 142 (ref 137–147)

## 2019-11-20 LAB — HEMOGLOBIN A1C: Hemoglobin A1C: 5.8

## 2019-11-26 ENCOUNTER — Non-Acute Institutional Stay: Payer: Medicare Other | Admitting: Internal Medicine

## 2019-11-26 ENCOUNTER — Other Ambulatory Visit: Payer: Self-pay

## 2019-11-26 ENCOUNTER — Encounter: Payer: Self-pay | Admitting: Internal Medicine

## 2019-11-26 VITALS — BP 118/72 | HR 70 | Temp 97.8°F | Ht 71.0 in | Wt 230.0 lb

## 2019-11-26 DIAGNOSIS — M159 Polyosteoarthritis, unspecified: Secondary | ICD-10-CM | POA: Diagnosis not present

## 2019-11-26 DIAGNOSIS — R739 Hyperglycemia, unspecified: Secondary | ICD-10-CM | POA: Diagnosis not present

## 2019-11-26 DIAGNOSIS — M25511 Pain in right shoulder: Secondary | ICD-10-CM | POA: Diagnosis not present

## 2019-11-26 DIAGNOSIS — M25512 Pain in left shoulder: Secondary | ICD-10-CM

## 2019-11-26 DIAGNOSIS — G8929 Other chronic pain: Secondary | ICD-10-CM

## 2019-11-26 DIAGNOSIS — E7800 Pure hypercholesterolemia, unspecified: Secondary | ICD-10-CM | POA: Diagnosis not present

## 2019-11-26 DIAGNOSIS — G250 Essential tremor: Secondary | ICD-10-CM

## 2019-11-26 DIAGNOSIS — I10 Essential (primary) hypertension: Secondary | ICD-10-CM | POA: Diagnosis not present

## 2019-11-26 NOTE — Progress Notes (Signed)
Location:  Occupational psychologist of Service:  Clinic (12)  Provider: Cindee Mclester L. Mariea Clonts, D.O., C.M.D.  Code Status: DNR Goals of Care:  Advanced Directives 11/26/2019  Does Patient Have a Medical Advance Directive? Yes  Type of Advance Directive Out of facility DNR (pink MOST or yellow form)  Does patient want to make changes to medical advance directive? No - Guardian declined  Copy of [place] in Chart? -  Would patient like information on creating a medical advance directive? -  Pre-existing out of facility DNR order (yellow form or pink MOST form) Pink MOST/Yellow Form most recent copy in chart - Physician notified to receive inpatient order     Chief Complaint  Patient presents with  . Medical Management of Chronic Issues    4 month follow up     HPI: Patient is a 74 y.o. male seen today for medical management of chronic diseases.    He had some special occasions over the summer and ate and ate and ate.  Labs did not have considerable issues from it.He can tell his weight has gone up.  He's not been doing well there.  He's up 8 lbs since April.  Not walking much.  Day to day eating has been alright.  Paces himself as long as they eat with others.  They've had several occasions eating with others and then gets desserts.     cholesterol was better and hba1c up just a little.    He resumed his propranolol again recently--taking it twice a day not tid.  It's helping but not enough.  He is shaking less, but not good enough.  Has to be careful with coffee.  Eating can be a problem keeping things on a fork but that's improved from before.  Had dry cough the first time he used propranolol, but was on hctz then.    Doing well with aches and pains.  Hip bothered him for several weeks, but it went away.  This very localized to the left hip.  Periods of walking a lot don't seem to trigger it.  Has not recently used aleve due to risks.  Rarely takes the  tylenol more for headache--probably less than once a month.    BP good on current regimen.  No dizziness.    Doesn't need tums when his weight is down--he gets reflux when it trends up.    Still has difficulty with shoulders when sleeps on them but not bothersome in the daytime.  Past Medical History:  Diagnosis Date  . Allergic rhinitis   . Arthritis   . Cancer (HCC)    basal cell nose  . Carotid atherosclerosis, bilateral    dvt  ?14  . DVT (deep venous thrombosis) ([place]) 11/22/2012  . GERD (gastroesophageal reflux disease)    occ  . High blood pressure   . Hypercholesteremia   . Hyperlipidemia   . Left atrial dilatation   . LVH (left ventricular hypertrophy)   . Obesity   . Seizures ([place])    "in 20's from aspirin"    Past Surgical History:  Procedure Laterality Date  . COLONOSCOPY  2015  . LUMBAR LAMINECTOMY/DECOMPRESSION MICRODISCECTOMY Left 11/30/2016   Procedure: LEFT LUMBAR FOUR-FIVE LAMINECTOMY FOR FACET/SYNOVIAL CYST;  Surgeon: Ashok Pall, MD;  Location: [place];  Service: Neurosurgery;  Laterality: Left;  LAMINECTOMY FOR FACET/SYNOVIAL CYST LUMBAR 4-LUMBAR 5, LEFT  . right leg fracture s/p surgical repair with screws Right 1966  .  SKIN CANCER EXCISION  2010   from his nose, no recurrence  . TOTAL KNEE ARTHROPLASTY Right 2015    Allergies  Allergen Reactions  . Asa [Aspirin] Other (See Comments)    Seizures     Outpatient Encounter Medications as of 11/26/2019  Medication Sig  . acetaminophen (TYLENOL) 500 MG tablet Take 500 mg by mouth at bedtime as needed for moderate pain.   Marland Kitchen atorvastatin (LIPITOR) 20 MG tablet TAKE 1 TABLET BY MOUTH  DAILY  . calcium carbonate (TUMS - DOSED IN MG ELEMENTAL CALCIUM) 500 MG chewable tablet Chew 1 tablet by mouth as needed for indigestion or heartburn.  . Cholecalciferol (VITAMIN D3) 2000 units TABS Take 2,000 Units by mouth every evening.   Marland Kitchen losartan (COZAAR) 100 MG tablet TAKE 1 TABLET(100 MG) BY MOUTH DAILY  .  naproxen sodium (ALEVE) 220 MG tablet Take 220 mg by mouth daily as needed (pain).  . propranolol (INDERAL) 10 MG tablet Take 10 mg by mouth 3 (three) times daily.  . [DISCONTINUED] tamsulosin (FLOMAX) 0.4 MG CAPS capsule Take 0.4 mg by mouth daily.   Facility-Administered Encounter Medications as of 11/26/2019  Medication  . 0.9 %  sodium chloride infusion    Review of Systems:  Review of Systems  Constitutional: Negative for chills and fever.  Eyes: Negative for blurred vision.  Respiratory: Negative for cough and shortness of breath.   Cardiovascular: Negative for chest pain, palpitations and leg swelling.  Gastrointestinal: Negative for abdominal pain, blood in stool, constipation, diarrhea and melena.  Genitourinary: Negative for dysuria.       No changes  Musculoskeletal: Positive for joint pain. Negative for back pain and falls.  Skin: Negative for itching and rash.  Neurological: Positive for tremors. Negative for dizziness and loss of consciousness.  Endo/Heme/Allergies: Does not bruise/bleed easily.  Psychiatric/Behavioral: Negative for depression and memory loss. The patient is not nervous/anxious.    Health Maintenance  Topic Date Due  . Hepatitis C Screening  Never done  . INFLUENZA VACCINE  11/02/2019  . TETANUS/TDAP  04/04/2023  . COLONOSCOPY  05/27/2029  . COVID-19 Vaccine  Completed  . PNA vac Low Risk Adult  Completed    Physical Exam: Vitals:   11/26/19 0822  BP: 118/72  Pulse: 70  Temp: 97.8 F (36.6 C)  TempSrc: Temporal  SpO2: 96%  Weight: 230 lb (104.3 kg)  Height: 5\' 11"  (1.803 m)   Body mass index is 32.08 kg/m. Physical Exam Vitals reviewed.  Constitutional:      General: He is not in acute distress.    Appearance: Normal appearance. He is not ill-appearing or toxic-appearing.  HENT:     Head: Normocephalic and atraumatic.  Eyes:     Comments: glasses  Cardiovascular:     Rate and Rhythm: Normal rate and regular rhythm.     Pulses:  Normal pulses.     Heart sounds: Normal heart sounds.  Pulmonary:     Effort: Pulmonary effort is normal.     Breath sounds: Normal breath sounds. No wheezing, rhonchi or rales.  Abdominal:     General: Bowel sounds are normal.  Musculoskeletal:        General: Normal range of motion.     Cervical back: Neck supple.     Right lower leg: No edema.     Left lower leg: No edema.  Skin:    General: Skin is warm and dry.     Capillary Refill: Capillary refill takes less than 2  seconds.  Neurological:     General: No focal deficit present.     Mental Status: He is alert and oriented to person, place, and time.     Motor: No weakness.     Gait: Gait normal.  Psychiatric:        Mood and Affect: Mood normal.        Behavior: Behavior normal.     Labs reviewed: Basic Metabolic Panel: No results for input(s): NA, K, CL, CO2, GLUCOSE, BUN, CREATININE, CALCIUM, MG, PHOS, TSH in the last 8760 hours. Liver Function Tests: No results for input(s): AST, ALT, ALKPHOS, BILITOT, PROT, ALBUMIN in the last 8760 hours. No results for input(s): LIPASE, AMYLASE in the last 8760 hours. No results for input(s): AMMONIA in the last 8760 hours. CBC: Recent Labs    07/15/19 0757  WBC 6.8  HGB 15.1  HCT 45  PLT 226   Lipid Panel: No results for input(s): CHOL, HDL, LDLCALC, TRIG, CHOLHDL, LDLDIRECT in the last 8760 hours. Lab Results  Component Value Date   HGBA1C 5.6 07/15/2019    Assessment/Plan 1. Hyperglycemia -hba1c trended up from 5.6 to 5.8 -he needs to reduce dessert intake and resume his walking--he's aware b/c he's gained weight and can feel it with reflux and just how his waistline feels  2. Benign essential tremor -cont propranolol--explained he can take tid not just bid and see if that helps more -no dizziness  3. Essential hypertension, benign -bp well controlled but not overly low with addition of propranolol for tremor, HR remains normal -cont current regimen as above  and monitor  4. Generalized osteoarthritis of multiple sites -recently, left hip more bothersome but then resolved--different from when back hurt -rarely takes tylenol, no aleve at all lately  5. Pure hypercholesterolemia -cont current statin therapy, LDL improving gradually over time despite his eating habits and decreased exercise lately  6. Chronic pain of both shoulders -recently only bothering if sleeps on them -may use tylenol, topicals if needed but has not had to  Labs/tests ordered:  Hep c screen, hba1c and bmp before Next appt:  6 mos with labs before at [place]. Colton Hart, D.O. [facility] [address], [place] [address] Cell Phone (Mon-Fri 8am-5pm):  [phone] On Call:  [phone] & follow prompts after 5pm & weekends Office Phone:  [phone] Office Fax:  [phone]

## 2020-01-26 ENCOUNTER — Encounter: Payer: Medicare Other | Admitting: Family

## 2020-01-30 ENCOUNTER — Encounter: Payer: Medicare Other | Admitting: Family

## 2020-02-19 ENCOUNTER — Other Ambulatory Visit: Payer: Self-pay

## 2020-02-19 ENCOUNTER — Encounter: Payer: Self-pay | Admitting: Nurse Practitioner

## 2020-02-19 ENCOUNTER — Telehealth: Payer: Self-pay

## 2020-02-19 ENCOUNTER — Ambulatory Visit (INDEPENDENT_AMBULATORY_CARE_PROVIDER_SITE_OTHER): Payer: Medicare Other | Admitting: Nurse Practitioner

## 2020-02-19 DIAGNOSIS — Z0000 Encounter for general adult medical examination without abnormal findings: Secondary | ICD-10-CM

## 2020-02-19 NOTE — Telephone Encounter (Signed)
Mr. kayen, grabel are scheduled for a virtual visit with your provider today.    Just as we do with appointments in the office, we must obtain your consent to participate.  Your consent will be active for this visit and any virtual visit you may have with one of our providers in the next 365 days.    If you have a MyChart account, I can also send a copy of this consent to you electronically.  All virtual visits are billed to your insurance company just like a traditional visit in the office.  As this is a virtual visit, video technology does not allow for your provider to perform a traditional examination.  This may limit your provider's ability to fully assess your condition.  If your provider identifies any concerns that need to be evaluated in person or the need to arrange testing such as labs, EKG, etc, we will make arrangements to do so.    Although advances in technology are sophisticated, we cannot ensure that it will always work on either your end or our end.  If the connection with a video visit is poor, we may have to switch to a telephone visit.  With either a video or telephone visit, we are not always able to ensure that we have a secure connection.   I need to obtain your verbal consent now.   Are you willing to proceed with your visit today?   BLAYTON HUTTNER has provided verbal consent on 02/19/2020 for a virtual visit (video or telephone).   Carroll Kinds, [facility] 02/19/2020  8:44 AM

## 2020-02-19 NOTE — Progress Notes (Signed)
This service is provided via telemedicine  No vital signs collected/recorded due to the encounter was a telemedicine visit.   Location of patient (ex: home, work): Home  Patient consents to a telephone visit:  Yes, see encounter dated 02/19/2020  Location of the provider (ex: office, home):  [place]  Name of any referring provider:  Hollace Kinnier, DO  Names of all persons participating in the telemedicine service and their role in the encounter:  Sherrie Mustache, Nurse Practitioner, Carroll Kinds, CMA, and patient.   Time spent on call:  7 minutes with medical assistant

## 2020-02-19 NOTE — Progress Notes (Signed)
Subjective:   Colton Hart is a 74 y.o. male who presents for Medicare Annual/Subsequent preventive examination.  Review of Systems           Objective:    There were no vitals filed for this visit. There is no height or weight on file to calculate BMI.  Advanced Directives 02/19/2020 11/26/2019 07/23/2019 05/05/2019 03/26/2019 01/23/2019 03/20/2018  Does Patient Have a Medical Advance Directive? Yes Yes Yes Yes Yes No Yes  Type of Advance Directive Out of facility DNR (pink MOST or yellow form) Out of facility DNR (pink MOST or yellow form) Out of facility DNR (pink MOST or yellow form) Out of facility DNR (pink MOST or yellow form) Healthcare Power of [place]  Does patient want to make changes to medical advance directive? No - Patient declined No - Guardian declined No - Patient declined No - Patient declined No - Patient declined - No - Patient declined  Copy of Worth in Chart? - - - - Yes - validated most recent copy scanned in chart (See row information) - Yes - validated most recent copy scanned in chart (See row information)  Would patient like information on creating a medical advance directive? - - - - - No - Patient declined -  Pre-existing out of facility DNR order (yellow form or pink MOST form) - Pink MOST/Yellow Form most recent copy in chart - Physician notified to receive inpatient order - - - - -    Current Medications (verified) Outpatient Encounter Medications as of 02/19/2020  Medication Sig   acetaminophen (TYLENOL) 500 MG tablet Take 500 mg by mouth at bedtime as needed for moderate pain.    atorvastatin (LIPITOR) 20 MG tablet TAKE 1 TABLET BY MOUTH  DAILY   calcium carbonate (TUMS - DOSED IN MG ELEMENTAL CALCIUM) 500 MG chewable tablet Chew 1 tablet by mouth as needed for indigestion or heartburn.   Cholecalciferol (VITAMIN D3) 2000 units TABS Take 2,000 Units by mouth every evening.    losartan (COZAAR)  100 MG tablet TAKE 1 TABLET(100 MG) BY MOUTH DAILY   naproxen sodium (ALEVE) 220 MG tablet Take 220 mg by mouth daily as needed (pain).   [DISCONTINUED] propranolol (INDERAL) 10 MG tablet Take 10 mg by mouth 3 (three) times daily.   Facility-Administered Encounter Medications as of 02/19/2020  Medication   0.9 %  sodium chloride infusion    Allergies (verified) Asa [aspirin]   History: Past Medical History:  Diagnosis Date   Allergic rhinitis    Arthritis    Cancer ([place])    basal cell nose   Carotid atherosclerosis, bilateral    dvt  ?14   Cataract    Removal both eyes    DVT (deep venous thrombosis) ([place]) 11/22/2012   GERD (gastroesophageal reflux disease)    occ   High blood pressure    Hypercholesteremia    Hyperlipidemia    Left atrial dilatation    LVH (left ventricular hypertrophy)    Obesity    Seizures ([place])    "in 20's from aspirin"   Past Surgical History:  Procedure Laterality Date   CATARACT EXTRACTION, BILATERAL     COLONOSCOPY  2015   LUMBAR LAMINECTOMY/DECOMPRESSION MICRODISCECTOMY Left 11/30/2016   Procedure: LEFT LUMBAR FOUR-FIVE LAMINECTOMY FOR FACET/SYNOVIAL CYST;  Surgeon: Ashok Pall, MD;  Location: [facility];  Service: Neurosurgery;  Laterality: Left;  LAMINECTOMY FOR FACET/SYNOVIAL CYST LUMBAR 4-LUMBAR 5, LEFT   right leg  fracture s/p surgical repair with screws Right 1966   SKIN CANCER EXCISION  2010   from his nose, no recurrence   TOTAL KNEE ARTHROPLASTY Right 2015   Family History  Problem Relation Age of Onset   Alzheimer's disease Mother    Cancer Mother        Skin   Alzheimer's disease Father    Cancer Father        colon, liver, skin   Colon polyps Father    Colon cancer Father    Esophageal cancer Neg Hx    Rectal cancer Neg Hx    Stomach cancer Neg Hx    Social History   Socioeconomic History   Marital status: Married    Spouse name: Not on file   Number of children: Not on file   Years  of education: Not on file   Highest education level: Not on file  Occupational History   Not on file  Tobacco Use   Smoking status: Former Smoker    Packs/day: 1.00    Years: 20.00    Pack years: 20.00    Types: Cigarettes    Quit date: 11/27/1988    Years since quitting: 31.2   Smokeless tobacco: Never Used  Vaping Use   Vaping Use: Never used  Substance and Sexual Activity   Alcohol use: No   Drug use: No   Sexual activity: Not on file  Other Topics Concern   Not on file  Social History Narrative   Diet?        Do you drink/eat things with caffeine? Yes      Marital status?            Married                        What year were you married? 2001      Do you live in a house, apartment, assisted living, condo, trailer, etc.? home      Is it one or more stories? no      How many persons live in your home? 2      Do you have any pets in your home? (please list) no      Current or past profession: IT consultant      Do you exercise?      No                                Type & how often? Walk 1-2 miles, 1-2 times per week      Do you have a living will? No      Do you have a DNR form?    No                              If not, do you want to discuss one? Yes      Do you have signed POA/HPOA for forms?  Yes   Social Determinants of Health   Financial Resource Strain:    Difficulty of Paying Living Expenses: Not on file  Food Insecurity:    Worried About Marine in the Last Year: Not on file   Ran Out of Food in the Last Year: Not on file  Transportation Needs:    Lack of Transportation (Medical): Not on file   Lack of Transportation (Non-Medical):  Not on file  Physical Activity:    Days of Exercise per Week: Not on file   Minutes of Exercise per Session: Not on file  Stress:    Feeling of Stress : Not on file  Social Connections:    Frequency of Communication with Friends and Family: Not on file   Frequency of Social  Gatherings with Friends and Family: Not on file   Attends Religious Services: Not on file   Active Member of Clubs or Organizations: Not on file   Attends Archivist Meetings: Not on file   Marital Status: Not on file    Tobacco Counseling Counseling given: Not Answered   Clinical Intake:                 Diabetic?         Activities of Daily Living No flowsheet data found.  Patient Care Team: Gayland Curry, DO as PCP - General (Geriatric Medicine)  Indicate any recent Medical Services you may have received from other than [facility] providers in the past year (date may be approximate).     Assessment:   This is a routine wellness examination for Colton Hart.  Hearing/Vision screen  Hearing Screening   125Hz  250Hz  500Hz  1000Hz  2000Hz  3000Hz  4000Hz  6000Hz  8000Hz   Right ear:           Left ear:           Comments: Patient has not hearing problems  Vision Screening Comments: Patient wears glasses. Patient had cataract removed last year. Patient had eye exam within last year. Patient sees Dr. Lyndal Pulley  Dietary issues and exercise activities discussed:    Goals     Maintain Lifestyle     Starting today pt will maintain lifestyle.       Depression Screen PHQ 2/9 Scores 02/19/2020 07/23/2019 05/05/2019 03/26/2019 01/23/2019 11/27/2018 07/31/2018  PHQ - 2 Score 0 0 0 0 0 0 0    Fall Risk Fall Risk  02/19/2020 11/26/2019 07/23/2019 05/05/2019 03/26/2019  Falls in the past year? 0 0 0 - 0  Number falls in past yr: 0 - 0 0 0  Injury with Fall? 0 - 0 0 0    Any stairs in or around the home? No  If so, are there any without handrails? No  Home free of loose throw rugs in walkways, pet beds, electrical cords, etc? Yes  Adequate lighting in your home to reduce risk of falls? Yes   ASSISTIVE DEVICES UTILIZED TO PREVENT FALLS:  Life alert? No  Use of a cane, walker or w/c? No  Grab bars in the bathroom? Yes  Shower chair or bench in shower? No  Elevated  toilet seat or a handicapped toilet? No   TIMED UP AND GO:  Was the test performed? No .    Cognitive Function: MMSE - Mini Mental State Exam 02/05/2018 01/09/2017  Orientation to time 5 5  Orientation to Place 5 5  Registration 3 3  Attention/ Calculation 5 5  Recall 3 3  Language- name 2 objects 2 2  Language- repeat 1 1  Language- follow 3 step command 3 3  Language- read & follow direction 1 1  Write a sentence 1 1  Copy design 1 1  Total score 30 30     6CIT Screen 02/19/2020 01/23/2019  What Year? 0 points 0 points  What month? 0 points 0 points  What time? 0 points 0 points  Count back from 20 0 points  0 points  Months in reverse 0 points 0 points  Repeat phrase 0 points 2 points  Total Score 0 2    Immunizations Immunization History  Administered Date(s) Administered   Hep A / Hep B 04/03/2002   Influenza, High Dose Seasonal PF 01/09/2014, 01/07/2015, 01/09/2018, 01/10/2019, 01/30/2020   Influenza-Unspecified 02/18/2016, 01/22/2017   Moderna SARS-COVID-2 Vaccination 04/15/2019, 05/14/2019, 02/17/2020   Pneumococcal Conjugate-13 09/19/2017   Pneumococcal-Unspecified 09/30/2013, 01/02/2015   Tdap 04/03/2013   Zoster Recombinat (Shingrix) 08/18/2017, 10/20/2017, 11/29/2017    TDAP status: Up to date Flu Vaccine status: Up to date Pneumococcal vaccine status: Up to date Covid-19 vaccine status: Completed vaccines  Qualifies for Shingles Vaccine? Yes   Zostavax completed No   Shingrix Completed?: Yes  Screening Tests Health Maintenance  Topic Date Due   Hepatitis C Screening  Never done   TETANUS/TDAP  04/04/2023   COLONOSCOPY  05/27/2029   INFLUENZA VACCINE  Completed   COVID-19 Vaccine  Completed   PNA vac Low Risk Adult  Completed    Health Maintenance  Health Maintenance Due  Topic Date Due   Hepatitis C Screening  Never done    Colorectal cancer screening: Completed 05/2019. Repeat every 10 years  Lung Cancer Screening:  (Low Dose CT Chest recommended if Age 60-80 years, 30 pack-year currently smoking OR have quit w/in 15years.) does not qualify.   Lung Cancer Screening Referral: na  Additional Screening:  Hepatitis C Screening: does qualify; Completed -with next lab draw  Vision Screening: Recommended annual ophthalmology exams for early detection of glaucoma and other disorders of the eye. Is the patient up to date with their annual eye exam?  Yes  Who is the provider or what is the name of the office in which the patient attends annual eye exams? Dr Rana Snare If pt is not established with a provider, would they like to be referred to a provider to establish care? No .   Dental Screening: Recommended annual dental exams for proper oral hygiene  Community Resource Referral / Chronic Care Management: CRR required this visit?  No   CCM required this visit?  No      Plan:     I have personally reviewed and noted the following in the patients chart:    Medical and social history  Use of alcohol, tobacco or illicit drugs   Current medications and supplements  Functional ability and status  Nutritional status  Physical activity  Advanced directives  List of other physicians  Hospitalizations, surgeries, and ER visits in previous 12 months  Vitals  Screenings to include cognitive, depression, and falls  Referrals and appointments  In addition, I have reviewed and discussed with patient certain preventive protocols, quality metrics, and best practice recommendations. A written personalized care plan for preventive services as well as general preventive health recommendations were provided to patient.     Lauree Chandler, NP   02/19/2020    Virtual Visit via Telephone Note  I connected with@ on 02/19/20 at  8:30 AM EST by telephone and verified that I am speaking with the correct person using two identifiers.  Location: Patient: pt Provider: [facility]   I discussed the  limitations, risks, security and privacy concerns of performing an evaluation and management service by telephone and the availability of in person appointments. I also discussed with the patient that there may be a patient responsible charge related to this service. The patient expressed understanding and agreed to proceed.   I discussed  the assessment and treatment plan with the patient. The patient was provided an opportunity to ask questions and all were answered. The patient agreed with the plan and demonstrated an understanding of the instructions.   The patient was advised to call back or seek an in-person evaluation if the symptoms worsen or if the condition fails to improve as anticipated.  I provided 18 minutes of non-face-to-face time during this encounter.  Colton Hart. Colton Hart Avs printed and mailed

## 2020-02-19 NOTE — Patient Instructions (Signed)
Colton Hart , Thank you for taking time to come for your Medicare Wellness Visit. I appreciate your ongoing commitment to your health goals. Please review the following plan we discussed and let me know if I can assist you in the future.   Screening recommendations/referrals: Colonoscopy up to date Recommended yearly ophthalmology/optometry visit for glaucoma screening and checkup Recommended yearly dental visit for hygiene and checkup  Vaccinations: Influenza vaccine up to date Pneumococcal vaccine up to date Tdap vaccine up to date Shingles vaccine up to date     Advanced directives: on file.   Conditions/risks identified: obesity, advanced age, sedentary lifestyle, hypertension, hyperlipidemia  Next appointment: 1 year  Preventive Care 72 Years and Older, Male Preventive care refers to lifestyle choices and visits with your health care provider that can promote health and wellness. What does preventive care include?  A yearly physical exam. This is also called an annual well check.  Dental exams once or twice a year.  Routine eye exams. Ask your health care provider how often you should have your eyes checked.  Personal lifestyle choices, including:  Daily care of your teeth and gums.  Regular physical activity.  Eating a healthy diet.  Avoiding tobacco and drug use.  Limiting alcohol use.  Practicing safe sex.  Taking low doses of aspirin every day.  Taking vitamin and mineral supplements as recommended by your health care provider. What happens during an annual well check? The services and screenings done by your health care provider during your annual well check will depend on your age, overall health, lifestyle risk factors, and family history of disease. Counseling  Your health care provider may ask you questions about your:  Alcohol use.  Tobacco use.  Drug use.  Emotional well-being.  Home and relationship well-being.  Sexual activity.  Eating  habits.  History of falls.  Memory and ability to understand (cognition).  Work and work Statistician. Screening  You may have the following tests or measurements:  Height, weight, and BMI.  Blood pressure.  Lipid and cholesterol levels. These may be checked every 5 years, or more frequently if you are over 72 years old.  Skin check.  Lung cancer screening. You may have this screening every year starting at age 72 if you have a 30-pack-year history of smoking and currently smoke or have quit within the past 15 years.  Fecal occult blood test (FOBT) of the stool. You may have this test every year starting at age 72.  Flexible sigmoidoscopy or colonoscopy. You may have a sigmoidoscopy every 5 years or a colonoscopy every 10 years starting at age 72.  Prostate cancer screening. Recommendations will vary depending on your family history and other risks.  Hepatitis C blood test.  Hepatitis B blood test.  Sexually transmitted disease (STD) testing.  Diabetes screening. This is done by checking your blood sugar (glucose) after you have not eaten for a while (fasting). You may have this done every 1-3 years.  Abdominal aortic aneurysm (AAA) screening. You may need this if you are a current or former smoker.  Osteoporosis. You may be screened starting at age 72 if you are at high risk. Talk with your health care provider about your test results, treatment options, and if necessary, the need for more tests. Vaccines  Your health care provider may recommend certain vaccines, such as:  Influenza vaccine. This is recommended every year.  Tetanus, diphtheria, and acellular pertussis (Tdap, Td) vaccine. You may need a Td booster every  10 years.  Zoster vaccine. You may need this after age 72.  Pneumococcal 13-valent conjugate (PCV13) vaccine. One dose is recommended after age 65.  Pneumococcal polysaccharide (PPSV23) vaccine. One dose is recommended after age 72. Talk to your health  care provider about which screenings and vaccines you need and how often you need them. This information is not intended to replace advice given to you by your health care provider. Make sure you discuss any questions you have with your health care provider. Document Released: 04/16/2015 Document Revised: 12/08/2015 Document Reviewed: 01/19/2015 Elsevier Interactive Patient Education  2017 [place] Prevention in the Home Falls can cause injuries. They can happen to people of all ages. There are many things you can do to make your home safe and to help prevent falls. What can I do on the outside of my home?  Regularly fix the edges of walkways and driveways and fix any cracks.  Remove anything that might make you trip as you walk through a door, such as a raised step or threshold.  Trim any bushes or trees on the path to your home.  Use bright outdoor lighting.  Clear any walking paths of anything that might make someone trip, such as rocks or tools.  Regularly check to see if handrails are loose or broken. Make sure that both sides of any steps have handrails.  Any raised decks and porches should have guardrails on the edges.  Have any leaves, snow, or ice cleared regularly.  Use sand or salt on walking paths during winter.  Clean up any spills in your garage right away. This includes oil or grease spills. What can I do in the bathroom?  Use night lights.  Install grab bars by the toilet and in the tub and shower. Do not use towel bars as grab bars.  Use non-skid mats or decals in the tub or shower.  If you need to sit down in the shower, use a plastic, non-slip stool.  Keep the floor dry. Clean up any water that spills on the floor as soon as it happens.  Remove soap buildup in the tub or shower regularly.  Attach bath mats securely with double-sided non-slip rug tape.  Do not have throw rugs and other things on the floor that can make you trip. What can I do  in the bedroom?  Use night lights.  Make sure that you have a light by your bed that is easy to reach.  Do not use any sheets or blankets that are too big for your bed. They should not hang down onto the floor.  Have a firm chair that has side arms. You can use this for support while you get dressed.  Do not have throw rugs and other things on the floor that can make you trip. What can I do in the kitchen?  Clean up any spills right away.  Avoid walking on wet floors.  Keep items that you use a lot in easy-to-reach places.  If you need to reach something above you, use a strong step stool that has a grab bar.  Keep electrical cords out of the way.  Do not use floor polish or wax that makes floors slippery. If you must use wax, use non-skid floor wax.  Do not have throw rugs and other things on the floor that can make you trip. What can I do with my stairs?  Do not leave any items on the stairs.  Make sure  that there are handrails on both sides of the stairs and use them. Fix handrails that are broken or loose. Make sure that handrails are as long as the stairways.  Check any carpeting to make sure that it is firmly attached to the stairs. Fix any carpet that is loose or worn.  Avoid having throw rugs at the top or bottom of the stairs. If you do have throw rugs, attach them to the floor with carpet tape.  Make sure that you have a light switch at the top of the stairs and the bottom of the stairs. If you do not have them, ask someone to add them for you. What else can I do to help prevent falls?  Wear shoes that:  Do not have high heels.  Have rubber bottoms.  Are comfortable and fit you well.  Are closed at the toe. Do not wear sandals.  If you use a stepladder:  Make sure that it is fully opened. Do not climb a closed stepladder.  Make sure that both sides of the stepladder are locked into place.  Ask someone to hold it for you, if possible.  Clearly mark  and make sure that you can see:  Any grab bars or handrails.  First and last steps.  Where the edge of each step is.  Use tools that help you move around (mobility aids) if they are needed. These include:  Canes.  Walkers.  Scooters.  Crutches.  Turn on the lights when you go into a dark area. Replace any light bulbs as soon as they burn out.  Set up your furniture so you have a clear path. Avoid moving your furniture around.  If any of your floors are uneven, fix them.  If there are any pets around you, be aware of where they are.  Review your medicines with your doctor. Some medicines can make you feel dizzy. This can increase your chance of falling. Ask your doctor what other things that you can do to help prevent falls. This information is not intended to replace advice given to you by your health care provider. Make sure you discuss any questions you have with your health care provider. Document Released: 01/14/2009 Document Revised: 08/26/2015 Document Reviewed: 04/24/2014 Elsevier Interactive Patient Education  2017 [employer].

## 2020-04-02 ENCOUNTER — Other Ambulatory Visit: Payer: Self-pay | Admitting: Internal Medicine

## 2020-04-02 DIAGNOSIS — E7800 Pure hypercholesterolemia, unspecified: Secondary | ICD-10-CM

## 2020-04-05 NOTE — Telephone Encounter (Signed)
rx sent to pharmacy by e-script  

## 2020-04-10 IMAGING — CR DG SHOULDER 2+V*L*
3 series · 3 of 3 positions shown · non-contrast
Comparison: Left shoulder MRI 01/24/2017

CLINICAL DATA: Bilateral shoulder pain.

EXAM:
LEFT SHOULDER - 2+ VIEW

[w shoulder grashey left]
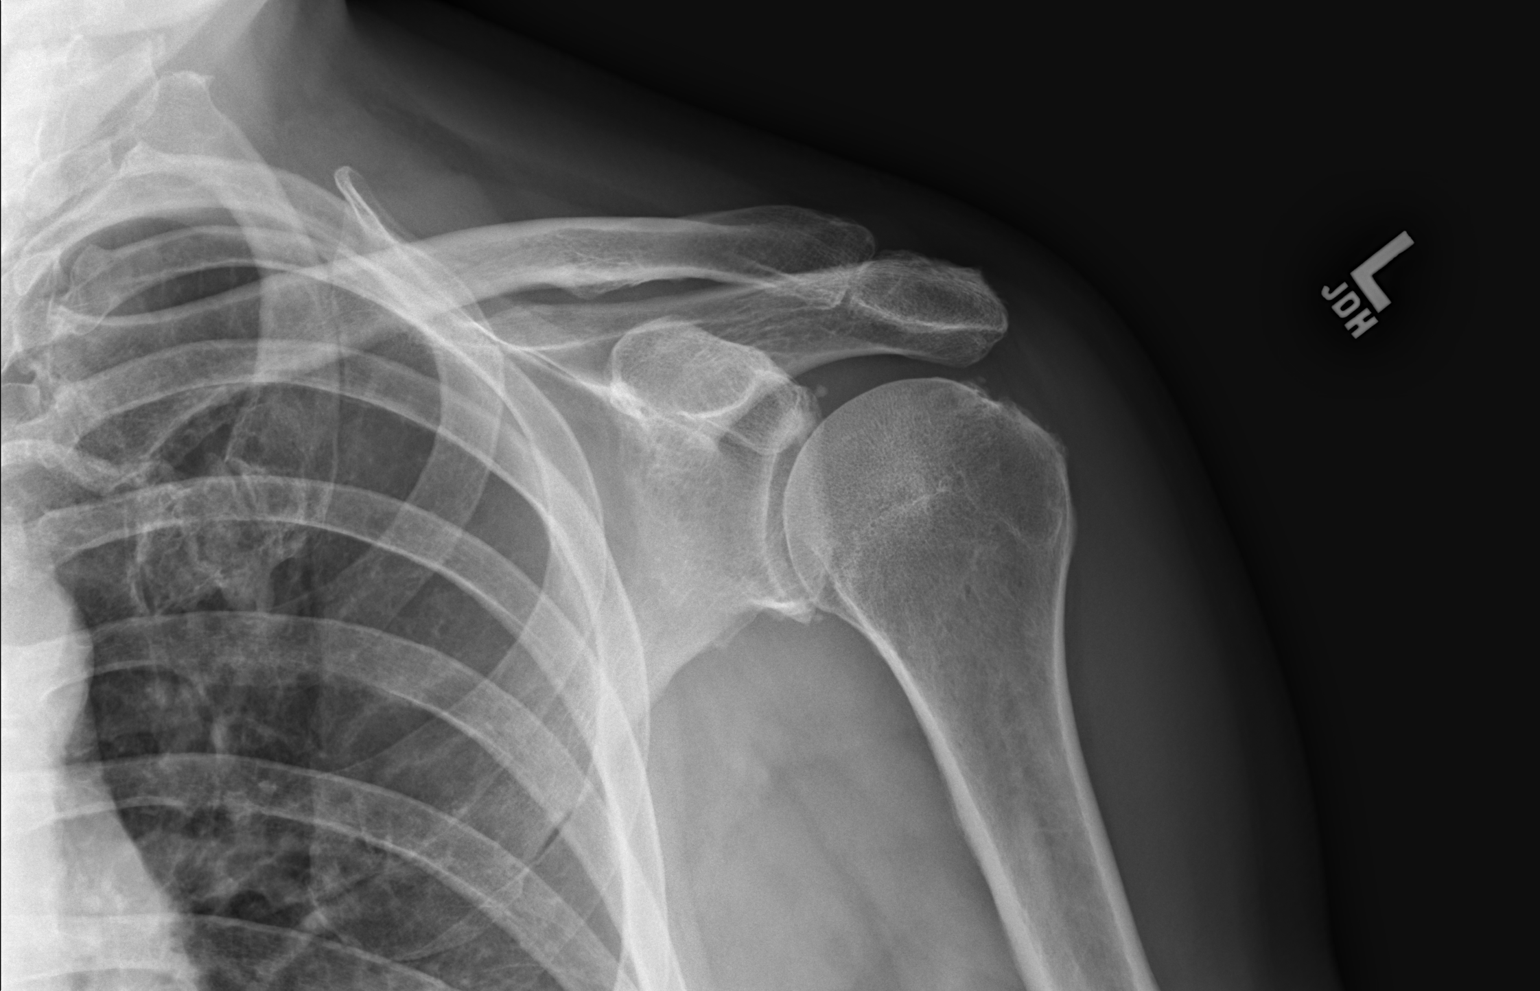

[w shoulder y-view left]
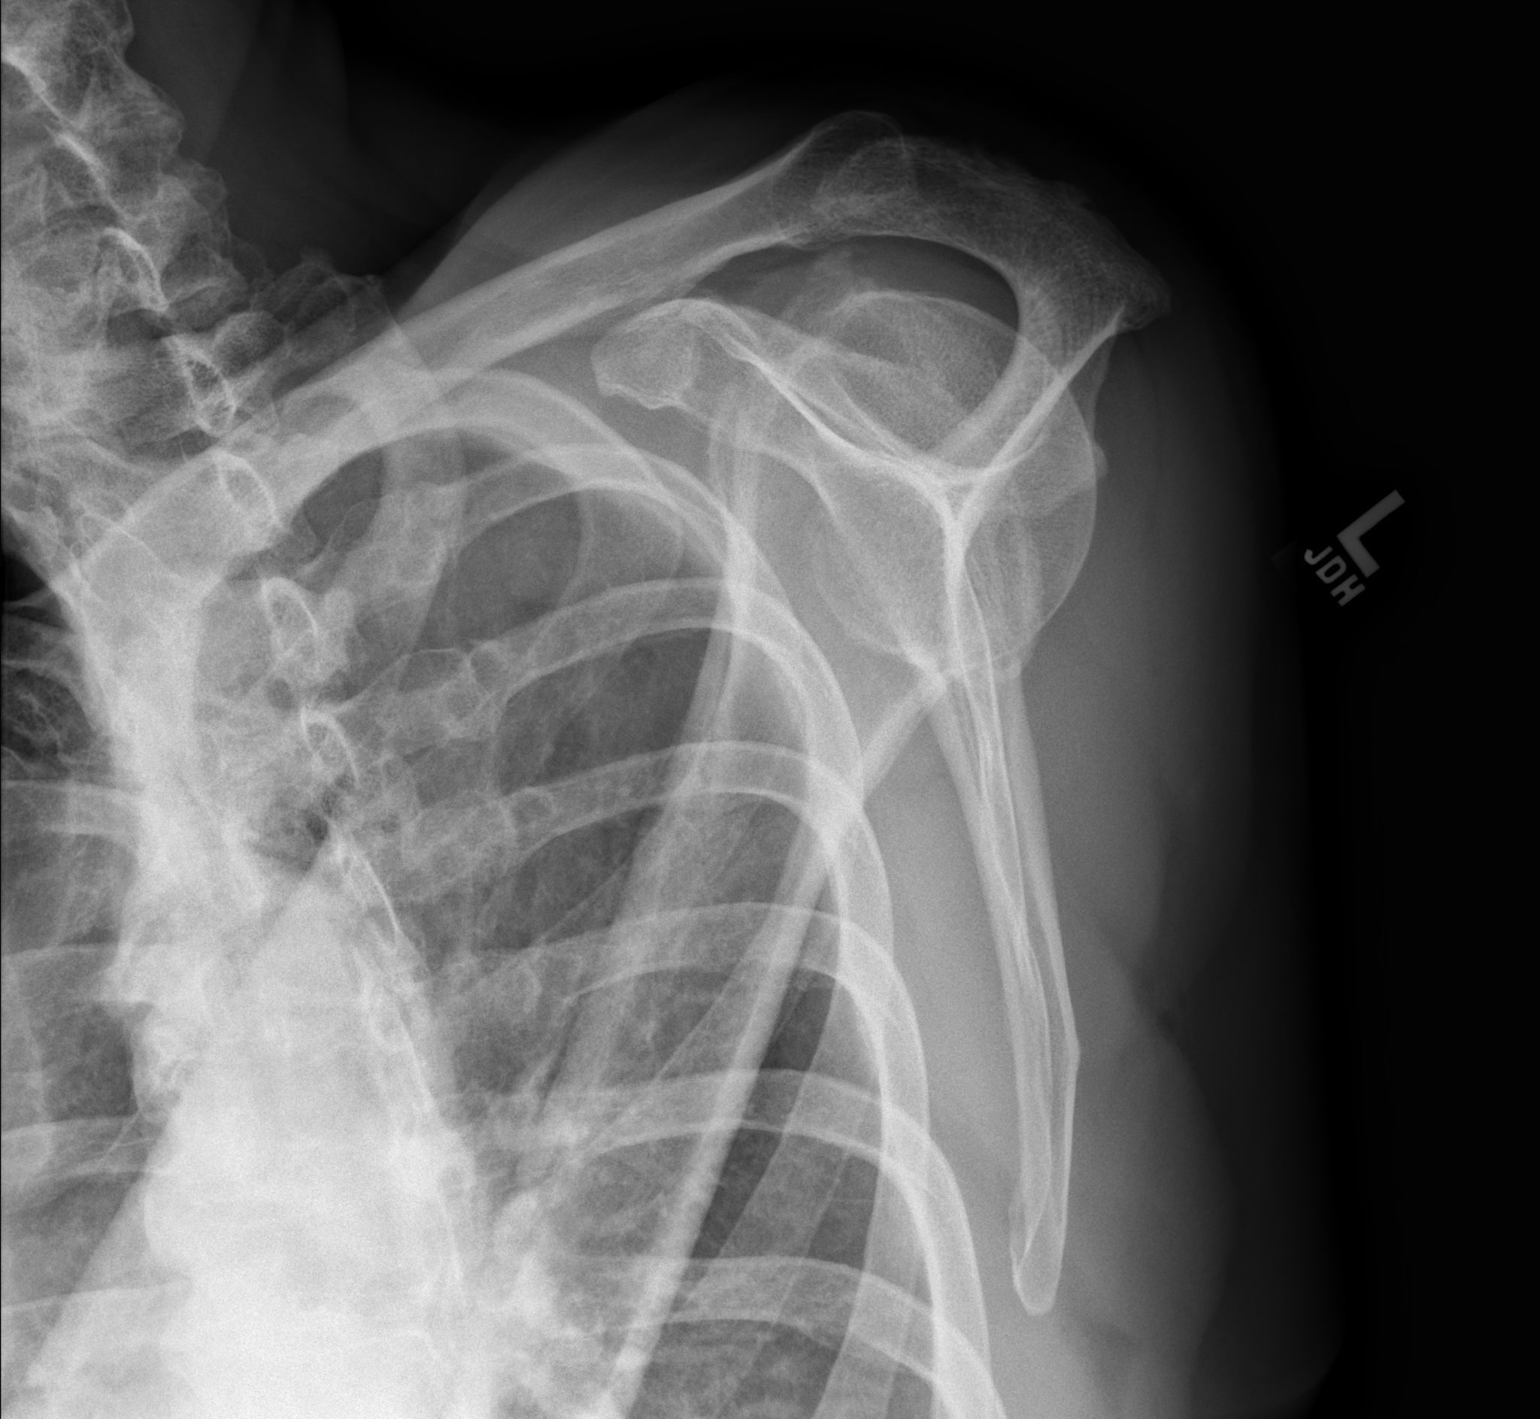

[w shoulder axillary left]
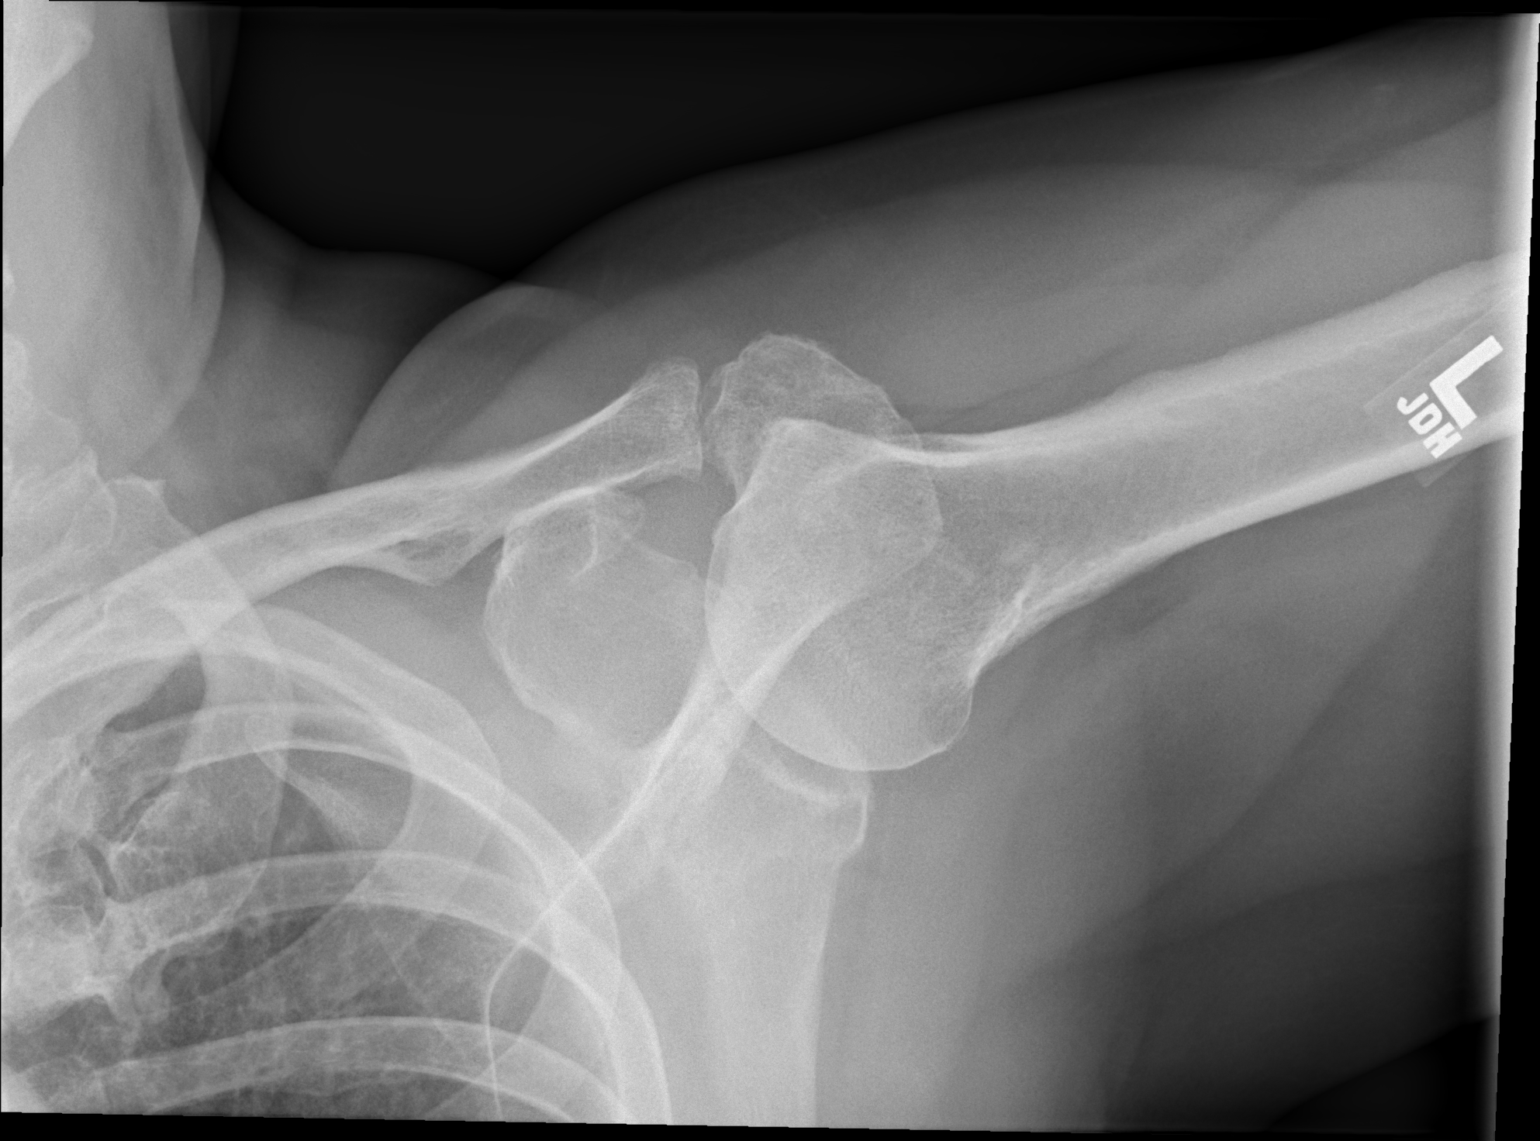

[3 of 3 positions shown; findings below may reference images not displayed]

FINDINGS: Mild AC joint and glenohumeral joint degenerative changes. Small
calcifications are noted around the left shoulder joint. No acute
bony findings or evidence of AVN. The left lung apex is clear. The
visualized left ribs are intact.
IMPRESSION: Mild AC joint and glenohumeral joint degenerative changes.

Small calcifications are noted around the glenohumeral joint which
could be due to chondrocalcinosis and calcific tendinitis.

## 2020-04-15 ENCOUNTER — Encounter: Payer: Self-pay | Admitting: Internal Medicine

## 2020-04-15 DIAGNOSIS — I10 Essential (primary) hypertension: Secondary | ICD-10-CM | POA: Diagnosis not present

## 2020-04-15 DIAGNOSIS — B1710 Acute hepatitis C without hepatic coma: Secondary | ICD-10-CM | POA: Diagnosis not present

## 2020-04-15 DIAGNOSIS — E785 Hyperlipidemia, unspecified: Secondary | ICD-10-CM | POA: Diagnosis not present

## 2020-04-15 DIAGNOSIS — E1165 Type 2 diabetes mellitus with hyperglycemia: Secondary | ICD-10-CM | POA: Diagnosis not present

## 2020-04-15 DIAGNOSIS — E7800 Pure hypercholesterolemia, unspecified: Secondary | ICD-10-CM | POA: Diagnosis not present

## 2020-04-15 DIAGNOSIS — Z1159 Encounter for screening for other viral diseases: Secondary | ICD-10-CM | POA: Diagnosis not present

## 2020-04-15 LAB — HM HEPATITIS C SCREENING LAB: HM Hepatitis Screen: NEGATIVE

## 2020-04-21 ENCOUNTER — Encounter: Payer: Self-pay | Admitting: Internal Medicine

## 2020-04-21 ENCOUNTER — Other Ambulatory Visit: Payer: Self-pay

## 2020-04-21 ENCOUNTER — Ambulatory Visit: Payer: Medicare Other | Admitting: Internal Medicine

## 2020-04-21 VITALS — BP 128/82 | HR 91 | Temp 96.6°F | Ht 71.0 in | Wt 238.6 lb

## 2020-04-21 DIAGNOSIS — M65332 Trigger finger, left middle finger: Secondary | ICD-10-CM

## 2020-04-21 DIAGNOSIS — E6609 Other obesity due to excess calories: Secondary | ICD-10-CM

## 2020-04-21 DIAGNOSIS — I10 Essential (primary) hypertension: Secondary | ICD-10-CM

## 2020-04-21 DIAGNOSIS — M159 Polyosteoarthritis, unspecified: Secondary | ICD-10-CM | POA: Diagnosis not present

## 2020-04-21 DIAGNOSIS — Z6833 Body mass index (BMI) 33.0-33.9, adult: Secondary | ICD-10-CM | POA: Diagnosis not present

## 2020-04-21 DIAGNOSIS — R739 Hyperglycemia, unspecified: Secondary | ICD-10-CM

## 2020-04-21 DIAGNOSIS — E7800 Pure hypercholesterolemia, unspecified: Secondary | ICD-10-CM | POA: Diagnosis not present

## 2020-04-21 DIAGNOSIS — Z6830 Body mass index (BMI) 30.0-30.9, adult: Secondary | ICD-10-CM | POA: Diagnosis not present

## 2020-04-21 DIAGNOSIS — G250 Essential tremor: Secondary | ICD-10-CM | POA: Diagnosis not present

## 2020-04-21 MED ORDER — PROPRANOLOL HCL 10 MG PO TABS: 10.0000 mg | ORAL_TABLET | Freq: Every day | ORAL | 0 refills | Status: AC | PRN

## 2020-04-21 NOTE — Progress Notes (Signed)
Location:   [facility]   Place of Service:   clinic  Provider: Lesta Limbert L. Mariea Clonts, D.O., C.M.D.  Code Status: DNR Goals of Care:  Advanced Directives 04/21/2020  Does Patient Have a Medical Advance Directive? Yes  Type of Advance Directive Out of facility DNR (pink MOST or yellow form)  Does patient want to make changes to medical advance directive? No - Patient declined  Copy of [place] in Chart? -  Would patient like information on creating a medical advance directive? -  Pre-existing out of facility DNR order (yellow form or pink MOST form) Pink MOST/Yellow Form most recent copy in chart - Physician notified to receive inpatient order     Chief Complaint  Patient presents with  . Medical Management of Chronic Issues    5 month follow up     HPI: Patient is a 75 y.o. male seen today for medical management of chronic diseases.    He's had bilateral cataract surgery and had some misalignment so now has prisms to address that.  Doing much better with aches and pains.  Left middle finger trigger finger remains.  It's a little worse in the am, but gets better.  Hips, shoulders ok.    BP was good.    He shares they had an indulgent holiday season--went on the [place] trip and ate and ate and did that then over Christmas and New Year's.  His weight is way up, but he's back on track with eating as he was before weight went way up.  Hba1c is up to 6.6 when it had been 5.8 in August.   He admits it's a good incentive to get back on track. He's not been walking either.     Tremor is no better, but no worse.  He does take his tremor med at times but it dries him out so bad so he just uses prn when they go out somewhere.   It bothers him most eating.    Past Medical History:  Diagnosis Date  . Allergic rhinitis   . Arthritis   . Cancer (HCC)    basal cell nose  . Carotid atherosclerosis, bilateral    dvt  ?14  . Cataract    Removal both eyes   . DVT (deep  venous thrombosis) ([facility]) 11/22/2012  . GERD (gastroesophageal reflux disease)    occ  . High blood pressure   . Hypercholesteremia   . Hyperlipidemia   . Left atrial dilatation   . LVH (left ventricular hypertrophy)   . Obesity   . Seizures ([place])    "in 20's from aspirin"    Past Surgical History:  Procedure Laterality Date  . CATARACT EXTRACTION, BILATERAL    . COLONOSCOPY  2015  . LUMBAR LAMINECTOMY/DECOMPRESSION MICRODISCECTOMY Left 11/30/2016   Procedure: LEFT LUMBAR FOUR-FIVE LAMINECTOMY FOR FACET/SYNOVIAL CYST;  Surgeon: Ashok Pall, MD;  Location: [facility];  Service: Neurosurgery;  Laterality: Left;  LAMINECTOMY FOR FACET/SYNOVIAL CYST LUMBAR 4-LUMBAR 5, LEFT  . right leg fracture s/p surgical repair with screws Right 1966  . SKIN CANCER EXCISION  2010   from his nose, no recurrence  . TOTAL KNEE ARTHROPLASTY Right 2015    Allergies  Allergen Reactions  . Asa [Aspirin] Other (See Comments)    Seizures     Outpatient Encounter Medications as of 04/21/2020  Medication Sig  . acetaminophen (TYLENOL) 500 MG tablet Take 500 mg by mouth at bedtime as needed for moderate pain.   Marland Kitchen  atorvastatin (LIPITOR) 20 MG tablet TAKE 1 TABLET BY MOUTH  DAILY  . Cholecalciferol (VITAMIN D3) 2000 units TABS Take 2,000 Units by mouth every evening.   Marland Kitchen. losartan (COZAAR) 100 MG tablet TAKE 1 TABLET(100 MG) BY MOUTH DAILY  . naproxen sodium (ALEVE) 220 MG tablet Take 220 mg by mouth daily as needed (pain).  . [DISCONTINUED] calcium carbonate (TUMS - DOSED IN MG ELEMENTAL CALCIUM) 500 MG chewable tablet Chew 1 tablet by mouth as needed for indigestion or heartburn.   Facility-Administered Encounter Medications as of 04/21/2020  Medication  . 0.9 %  sodium chloride infusion    Review of Systems:  Review of Systems  Constitutional: Negative for chills, fever and malaise/fatigue.  HENT: Negative for congestion and sore throat.   Eyes: Negative for blurred vision.  Respiratory: Negative for  cough and shortness of breath.   Cardiovascular: Negative for chest pain and palpitations.  Gastrointestinal: Negative for abdominal pain and constipation.  Genitourinary: Negative for dysuria.  Musculoskeletal: Negative for falls and joint pain.       Trigger finger left middle  Skin: Negative for itching and rash.  Neurological: Positive for tremors. Negative for dizziness and loss of consciousness.  Endo/Heme/Allergies: Does not bruise/bleed easily.  Psychiatric/Behavioral: Negative for depression and memory loss. The patient is not nervous/anxious and does not have insomnia.     Health Maintenance  Topic Date Due  . Hepatitis C Screening  Never done  . TETANUS/TDAP  04/04/2023  . COLONOSCOPY (Pts 45-2256yrs Insurance coverage will need to be confirmed)  05/27/2029  . INFLUENZA VACCINE  Completed  . COVID-19 Vaccine  Completed  . PNA vac Low Risk Adult  Completed    Physical Exam: Vitals:   04/21/20 0901  BP: 128/82  Pulse: 91  Temp: (!) 96.6 F (35.9 C)  TempSrc: Temporal  SpO2: 95%  Weight: 238 lb 9.6 oz (108.2 kg)  Height: 5\' 11"  (1.803 m)   Body mass index is 33.28 kg/m. Physical Exam Vitals reviewed.  Constitutional:      Appearance: Normal appearance. He is obese. He is not toxic-appearing.  HENT:     Head: Normocephalic and atraumatic.     Right Ear: External ear normal.     Left Ear: External ear normal.  Eyes:     Comments: New glasses  Cardiovascular:     Rate and Rhythm: Normal rate and regular rhythm.  Pulmonary:     Effort: Pulmonary effort is normal.     Breath sounds: Normal breath sounds. No wheezing, rhonchi or rales.  Abdominal:     General: Bowel sounds are normal.  Musculoskeletal:        General: Normal range of motion.     Right lower leg: No edema.     Left lower leg: No edema.  Neurological:     General: No focal deficit present.     Mental Status: He is alert and oriented to person, place, and time.  Psychiatric:        Mood and  Affect: Mood normal.     Labs reviewed: Basic Metabolic Panel: Recent Labs    11/20/19 0000  NA 142  K 4.5  CL 106  BUN 13  CREATININE 0.8  CALCIUM 9.4   Liver Function Tests: No results for input(s): AST, ALT, ALKPHOS, BILITOT, PROT, ALBUMIN in the last 8760 hours. No results for input(s): LIPASE, AMYLASE in the last 8760 hours. No results for input(s): AMMONIA in the last 8760 hours. CBC: Recent Labs  07/15/19 0757  WBC 6.8  HGB 15.1  HCT 45  PLT 226   Lipid Panel: Recent Labs    11/20/19 0000  CHOL 138  HDL 40  LDLCALC 75  TRIG 116   Lab Results  Component Value Date   HGBA1C 5.8 11/20/2019    Procedures since last visit: No results found.  Assessment/Plan 1. Body mass index (BMI) of 33.0-33.9 in adult -with poor choices over holidays, not walking -encouraged him to get back on track  2. Class 1 obesity due to excess calories with serious comorbidity and body mass index (BMI) of 30.0 to 30.9 in adult -gained weight back, saw his hba1c and that motivated him, he says  3. Hyperglycemia -now in diabetic range this time, hoping he'll get this back down in 4 mos with improved diet and walking again  4. Benign essential tremor -added the prn propranolol back on his list--that's how he uses right now due to dry mouth side effect  5. Essential hypertension, benign -bp is still well controlled, cont current mgt  6. Generalized osteoarthritis of multiple sites -no symptoms at present outside of trigger finger--hips and shoulders doing well now  7. Pure hypercholesterolemia -f/u flp before next visit--improve diet  8. Trigger middle finger of left hand -cont to monitor, f/u with ortho if needed  Labs/tests ordered:  Cbc, cmp, flp, hba1c, hep c screen  Next appt:  08/18/2020 with fasting labs before  Addis Tuohy L. Marly Schuld, D.O. [facility] [address], [place] [address] Cell Phone (Mon-Fri  8am-5pm):  [phone] On Call:  [phone] & follow prompts after 5pm & weekends Office Phone:  [phone] Office Fax:  [phone]

## 2020-05-24 ENCOUNTER — Encounter: Payer: Self-pay | Admitting: Internal Medicine

## 2020-06-01 ENCOUNTER — Other Ambulatory Visit: Payer: Self-pay | Admitting: Internal Medicine

## 2020-06-01 DIAGNOSIS — I10 Essential (primary) hypertension: Secondary | ICD-10-CM

## 2020-06-26 ENCOUNTER — Encounter: Payer: Self-pay | Admitting: Internal Medicine

## 2020-08-12 DIAGNOSIS — E785 Hyperlipidemia, unspecified: Secondary | ICD-10-CM | POA: Diagnosis not present

## 2020-08-12 DIAGNOSIS — R739 Hyperglycemia, unspecified: Secondary | ICD-10-CM | POA: Diagnosis not present

## 2020-08-12 DIAGNOSIS — B179 Acute viral hepatitis, unspecified: Secondary | ICD-10-CM | POA: Diagnosis not present

## 2020-08-12 DIAGNOSIS — I951 Orthostatic hypotension: Secondary | ICD-10-CM | POA: Diagnosis not present

## 2020-08-12 DIAGNOSIS — E7800 Pure hypercholesterolemia, unspecified: Secondary | ICD-10-CM | POA: Diagnosis not present

## 2020-08-12 DIAGNOSIS — I10 Essential (primary) hypertension: Secondary | ICD-10-CM | POA: Diagnosis not present

## 2020-08-12 LAB — HEPATIC FUNCTION PANEL
ALT: 36 (ref 10–40)
AST: 26 (ref 14–40)
Alkaline Phosphatase: 65 (ref 25–125)
Bilirubin, Total: 6.7

## 2020-08-12 LAB — COMPREHENSIVE METABOLIC PANEL
Albumin: 4.5 (ref 3.5–5.0)
Calcium: 9.3 (ref 8.7–10.7)
GFR calc Af Amer: >90
GFR calc non Af Amer: 79.37

## 2020-08-12 LAB — CBC: RBC: 4.85 (ref 3.87–5.11)

## 2020-08-12 LAB — BASIC METABOLIC PANEL
BUN: 14 (ref 4–21)
CO2: 23 — AB (ref 13–22)
Chloride: 107 (ref 99–108)
Creatinine: 1 (ref 0.6–1.3)
Glucose: 122
Potassium: 4.3 (ref 3.4–5.3)
Sodium: 144 (ref 137–147)

## 2020-08-12 LAB — CBC AND DIFFERENTIAL
HCT: 47 (ref 41–53)
Hemoglobin: 15.9 (ref 13.5–17.5)
Neutrophils Absolute: 3
Platelets: 264 (ref 150–399)
WBC: 6.5

## 2020-08-12 LAB — LIPID PANEL
Cholesterol: 144 (ref 0–200)
HDL: 36 (ref 35–70)
LDL Cholesterol: 85
LDl/HDL Ratio: 4
Triglycerides: 114 (ref 40–160)

## 2020-08-12 LAB — HEMOGLOBIN A1C: Hemoglobin A1C: 6.1

## 2020-08-13 ENCOUNTER — Telehealth: Payer: Self-pay

## 2020-08-13 NOTE — Telephone Encounter (Signed)
Per Dr. Daneen Schick 01/18/1946  Labs are good  Will discuss more when come for follow up  Discussed with the patient. No further questions.

## 2020-08-18 ENCOUNTER — Non-Acute Institutional Stay: Payer: Medicare Other | Admitting: Internal Medicine

## 2020-08-18 ENCOUNTER — Encounter: Payer: Self-pay | Admitting: Internal Medicine

## 2020-08-18 ENCOUNTER — Other Ambulatory Visit: Payer: Self-pay

## 2020-08-18 VITALS — BP 144/98 | HR 84 | Temp 96.5°F | Ht 71.0 in | Wt 234.8 lb

## 2020-08-18 DIAGNOSIS — N401 Enlarged prostate with lower urinary tract symptoms: Secondary | ICD-10-CM

## 2020-08-18 DIAGNOSIS — M159 Polyosteoarthritis, unspecified: Secondary | ICD-10-CM | POA: Diagnosis not present

## 2020-08-18 DIAGNOSIS — R350 Frequency of micturition: Secondary | ICD-10-CM

## 2020-08-18 DIAGNOSIS — M65332 Trigger finger, left middle finger: Secondary | ICD-10-CM | POA: Diagnosis not present

## 2020-08-18 DIAGNOSIS — M25511 Pain in right shoulder: Secondary | ICD-10-CM

## 2020-08-18 DIAGNOSIS — I10 Essential (primary) hypertension: Secondary | ICD-10-CM | POA: Diagnosis not present

## 2020-08-18 DIAGNOSIS — R739 Hyperglycemia, unspecified: Secondary | ICD-10-CM

## 2020-08-18 DIAGNOSIS — G250 Essential tremor: Secondary | ICD-10-CM | POA: Diagnosis not present

## 2020-08-18 DIAGNOSIS — M25512 Pain in left shoulder: Secondary | ICD-10-CM | POA: Diagnosis not present

## 2020-08-18 DIAGNOSIS — E7800 Pure hypercholesterolemia, unspecified: Secondary | ICD-10-CM | POA: Diagnosis not present

## 2020-08-18 DIAGNOSIS — G8929 Other chronic pain: Secondary | ICD-10-CM

## 2020-08-18 DIAGNOSIS — M5442 Lumbago with sciatica, left side: Secondary | ICD-10-CM

## 2020-08-18 NOTE — Patient Instructions (Signed)
Continue with your Walking and Avoiding Sugar Let me know if Aleve does not take care of pain in few doses. If back pain persist I will make referal to Dr Ernestina Patches Check BP Q Weekly if it is running more then 140/90 you would need another Med addition.

## 2020-08-18 NOTE — Progress Notes (Signed)
Location:  [facility] of Service:  Clinic (12)  Provider:   Code Status:  Goals of Care:  Advanced Directives 04/21/2020  Does Patient Have a Medical Advance Directive? Yes  Type of Advance Directive Out of facility DNR (pink MOST or yellow form)  Does patient want to make changes to medical advance directive? No - Patient declined  Copy of [place] in Chart? -  Would patient like information on creating a medical advance directive? -  Pre-existing out of facility DNR order (yellow form or pink MOST form) Pink MOST/Yellow Form most recent copy in chart - Physician notified to receive inpatient order     Chief Complaint  Patient presents with  . Medical Management of Chronic Issues    Patient returns to the clinic for follow up. He would like to discuss his labs, booster, referral to dermatology.     HPI: Patient is a 75 y.o. male seen today for medical management of chronic diseases.   Patient has   h/o Hyperglycemia  Not on Any Meds. Doing well with Diet and Exercise  Lower Back Pain S/P Laminectomy by Dr Jacklynn Lewis Has noticed recently worsening in his pain.  He states the pain is worse when he lies down.  Has tried Tylenol it helps sometimes.  He has not taken any Alleve yet  essential tremors Used to take inderal  but now  avoiding coffee the tremors have improved.   BPH Was on Flomax he stopped himself because of questionable increased urination He states his symptoms are manageable at this time  He also has a history of HLD,   hypertension Was on HCTZ with his Cozaar which was changed due to orthostatic hypotension  Lives in [place]. With his wife. Drives.No cane or Walker Past Medical History:  Diagnosis Date  . Allergic rhinitis   . Arthritis   . Cancer (HCC)    basal cell nose  . Carotid atherosclerosis, bilateral    dvt  ?14  . Cataract    Removal both eyes   . DVT (deep venous thrombosis) ([facility])  11/22/2012  . GERD (gastroesophageal reflux disease)    occ  . High blood pressure   . Hypercholesteremia   . Hyperlipidemia   . Left atrial dilatation   . LVH (left ventricular hypertrophy)   . Obesity   . Seizures ([facility])    "in 20's from aspirin"    Past Surgical History:  Procedure Laterality Date  . CATARACT EXTRACTION, BILATERAL    . COLONOSCOPY  2015  . LUMBAR LAMINECTOMY/DECOMPRESSION MICRODISCECTOMY Left 11/30/2016   Procedure: LEFT LUMBAR FOUR-FIVE LAMINECTOMY FOR FACET/SYNOVIAL CYST;  Surgeon: Ashok Pall, MD;  Location: [facility];  Service: Neurosurgery;  Laterality: Left;  LAMINECTOMY FOR FACET/SYNOVIAL CYST LUMBAR 4-LUMBAR 5, LEFT  . right leg fracture s/p surgical repair with screws Right 1966  . SKIN CANCER EXCISION  2010   from his nose, no recurrence  . TOTAL KNEE ARTHROPLASTY Right 2015    Allergies  Allergen Reactions  . Asa [Aspirin] Other (See Comments)    Seizures     Outpatient Encounter Medications as of 08/18/2020  Medication Sig  . acetaminophen (TYLENOL) 500 MG tablet Take 500 mg by mouth at bedtime as needed for moderate pain.   Marland Kitchen atorvastatin (LIPITOR) 20 MG tablet TAKE 1 TABLET BY MOUTH  DAILY  . Cholecalciferol (VITAMIN D3) 2000 units TABS Take 2,000 Units by mouth every evening.   Marland Kitchen losartan (COZAAR) 100  MG tablet TAKE 1 TABLET BY MOUTH  DAILY  . naproxen sodium (ALEVE) 220 MG tablet Take 220 mg by mouth daily as needed (pain).  . propranolol (INDERAL) 10 MG tablet Take 1 tablet (10 mg total) by mouth daily as needed (tremor).  . [DISCONTINUED] 0.9 %  sodium chloride infusion    No facility-administered encounter medications on file as of 08/18/2020.    Review of Systems:  Review of Systems  Constitutional: Negative.   HENT: Negative.   Respiratory: Negative.   Cardiovascular: Negative.   Gastrointestinal: Positive for constipation.  Genitourinary: Positive for frequency.  Musculoskeletal: Negative.   Skin: Negative.   Neurological:  Negative.   Psychiatric/Behavioral: Negative.     Health Maintenance  Topic Date Due  . INFLUENZA VACCINE  11/01/2020  . TETANUS/TDAP  04/04/2023  . COLONOSCOPY (Pts 45-58yrs Insurance coverage will need to be confirmed)  05/27/2024  . COVID-19 Vaccine  Completed  . Hepatitis C Screening  Completed  . PNA vac Low Risk Adult  Completed  . HPV VACCINES  Aged Out    Physical Exam: Vitals:   08/18/20 0859  BP: (!) 144/98  Pulse: 84  Temp: (!) 96.5 F (35.8 C)  SpO2: 96%  Weight: 234 lb 12.8 oz (106.5 kg)  Height: 5\' 11"  (1.803 m)   Body mass index is 32.75 kg/m. Physical Exam  Constitutional: Oriented to person, place, and time. Well-developed and well-nourished.  HENT:  Head: Normocephalic.  Mouth/Throat: Oropharynx is clear and moist.  Eyes: Pupils are equal, round, and reactive to light.  Neck: Neck supple.  Cardiovascular: Normal rate and normal heart sounds.  No murmur heard. Pulmonary/Chest: Effort normal and breath sounds normal. No respiratory distress. No wheezes. Has no rales.  Abdominal: Soft. Bowel sounds are normal. No distension. There is no tenderness. There is no rebound. Abdominal Hernia Present Musculoskeletal: No edema.  Lymphadenopathy: none Neurological: Alert and oriented to person, place, and time. Gait stable No Dizziness Skin: Skin is warm and dry.  Psychiatric: Normal mood and affect. Behavior is normal. Thought content normal.  Low  Labs reviewed: Basic Metabolic Panel: Recent Labs    11/20/19 0000  NA 142  K 4.5  CL 106  BUN 13  CREATININE 0.8  CALCIUM 9.4   Liver Function Tests: No results for input(s): AST, ALT, ALKPHOS, BILITOT, PROT, ALBUMIN in the last 8760 hours. No results for input(s): LIPASE, AMYLASE in the last 8760 hours. No results for input(s): AMMONIA in the last 8760 hours. CBC: No results for input(s): WBC, NEUTROABS, HGB, HCT, MCV, PLT in the last 8760 hours. Lipid Panel: Recent Labs    11/20/19 0000  CHOL  138  HDL 40  LDLCALC 75  TRIG 116   Lab Results  Component Value Date   HGBA1C 5.8 11/20/2019    Procedures since last visit: No results found.  Assessment/Plan 1. Essential hypertension, benign Check BP at home will Call us if it is persistent more then 140/90. Has h/o ? Postural Hypotension Was on Flomax and HCTZ before which was discontinued due to Frequent urination   2. Chronic left-sided low back pain with left-sided sciatica Exam today was negative today He can take Aleve QD with Food for 1 week. If Symptoms Persists he wants Referal to Dr Ernestina Patches  3. Benign essential tremor Stopped his coffee and that has helped him Has stopped Inderal by himself  4. Hyperglycemia A1C much Improved Wants to Continue Diet and Exercise Follows with Opthalmology  5. Pure hypercholesterolemia Doing  well with Statin  6. Chronic pain of both shoulders Has got Steroid Injected before by Dr Erlinda Hong Tylenol pRN  7. Trigger middle finger of left hand Does not want Referral yet. Says it does not bother him 8 Will get Covid Booster by himself 9 BPH Stopped Flomax due to Dizziness 10 Refer To Facility Dermatology for Screening   Labs/tests ordered:  CBC,CMP,A1C,Lipid Panel, TSH Next appt:  02/14/2021

## 2020-09-02 ENCOUNTER — Ambulatory Visit: Payer: Medicare Other | Attending: Internal Medicine

## 2020-09-02 ENCOUNTER — Other Ambulatory Visit: Payer: Self-pay

## 2020-09-02 ENCOUNTER — Other Ambulatory Visit (HOSPITAL_BASED_OUTPATIENT_CLINIC_OR_DEPARTMENT_OTHER): Payer: Self-pay

## 2020-09-02 DIAGNOSIS — Z23 Encounter for immunization: Secondary | ICD-10-CM

## 2020-09-02 MED ORDER — COVID-19 MRNA VACC (MODERNA) 100 MCG/0.5ML IM SUSP
INTRAMUSCULAR | 0 refills | Status: DC
  Filled 2020-09-02: qty 0.25, 1d supply, fill #0

## 2020-09-02 NOTE — Progress Notes (Signed)
   Covid-19 Vaccination Clinic  Name:  TRA WILEMON    MRN: [id number] DOB: 1946-01-24  09/02/2020  Mr. Bachar was observed post Covid-19 immunization for 15 minutes without incident. He was provided with Vaccine Information Sheet and instruction to access the V-Safe system.   Mr. Cozart was instructed to call 911 with any severe reactions post vaccine: Marland Kitchen Difficulty breathing  . Swelling of face and throat  . A fast heartbeat  . A bad rash all over body  . Dizziness and weakness   Immunizations Administered    Name Date Dose VIS Date Route   Moderna Covid-19 Booster Vaccine 09/02/2020  9:56 AM 0.25 mL 01/21/2020 Intramuscular   Manufacturer: Moderna   Lot: [id number]   [place]: [phone]

## 2020-10-19 ENCOUNTER — Telehealth: Payer: Self-pay

## 2020-10-19 DIAGNOSIS — E7800 Pure hypercholesterolemia, unspecified: Secondary | ICD-10-CM

## 2020-10-19 MED ORDER — ATORVASTATIN CALCIUM 20 MG PO TABS: 20.0000 mg | ORAL_TABLET | Freq: Every day | ORAL | 1 refills | Status: DC

## 2020-10-19 NOTE — Telephone Encounter (Signed)
Refill request received from pharmacy °

## 2020-10-20 DIAGNOSIS — Z85828 Personal history of other malignant neoplasm of skin: Secondary | ICD-10-CM | POA: Diagnosis not present

## 2020-10-20 DIAGNOSIS — D229 Melanocytic nevi, unspecified: Secondary | ICD-10-CM | POA: Diagnosis not present

## 2020-10-20 DIAGNOSIS — L820 Inflamed seborrheic keratosis: Secondary | ICD-10-CM | POA: Diagnosis not present

## 2020-10-20 DIAGNOSIS — L821 Other seborrheic keratosis: Secondary | ICD-10-CM | POA: Diagnosis not present

## 2020-10-20 DIAGNOSIS — L570 Actinic keratosis: Secondary | ICD-10-CM | POA: Diagnosis not present

## 2020-10-20 DIAGNOSIS — D1801 Hemangioma of skin and subcutaneous tissue: Secondary | ICD-10-CM | POA: Diagnosis not present

## 2020-10-20 DIAGNOSIS — L814 Other melanin hyperpigmentation: Secondary | ICD-10-CM | POA: Diagnosis not present

## 2020-11-09 DIAGNOSIS — H524 Presbyopia: Secondary | ICD-10-CM | POA: Diagnosis not present

## 2020-11-09 DIAGNOSIS — H0100A Unspecified blepharitis right eye, upper and lower eyelids: Secondary | ICD-10-CM | POA: Diagnosis not present

## 2020-11-09 DIAGNOSIS — H532 Diplopia: Secondary | ICD-10-CM | POA: Diagnosis not present

## 2020-11-09 DIAGNOSIS — Z961 Presence of intraocular lens: Secondary | ICD-10-CM | POA: Diagnosis not present

## 2020-11-17 DIAGNOSIS — L570 Actinic keratosis: Secondary | ICD-10-CM | POA: Diagnosis not present

## 2020-11-17 DIAGNOSIS — L820 Inflamed seborrheic keratosis: Secondary | ICD-10-CM | POA: Diagnosis not present

## 2020-11-17 DIAGNOSIS — L821 Other seborrheic keratosis: Secondary | ICD-10-CM | POA: Diagnosis not present

## 2020-11-17 DIAGNOSIS — L814 Other melanin hyperpigmentation: Secondary | ICD-10-CM | POA: Diagnosis not present

## 2020-11-17 DIAGNOSIS — Z85828 Personal history of other malignant neoplasm of skin: Secondary | ICD-10-CM | POA: Diagnosis not present

## 2020-11-17 DIAGNOSIS — D229 Melanocytic nevi, unspecified: Secondary | ICD-10-CM | POA: Diagnosis not present

## 2020-11-17 DIAGNOSIS — D1801 Hemangioma of skin and subcutaneous tissue: Secondary | ICD-10-CM | POA: Diagnosis not present

## 2020-12-08 ENCOUNTER — Other Ambulatory Visit: Payer: Self-pay | Admitting: *Deleted

## 2020-12-08 DIAGNOSIS — I10 Essential (primary) hypertension: Secondary | ICD-10-CM

## 2020-12-08 MED ORDER — LOSARTAN POTASSIUM 100 MG PO TABS: 100.0000 mg | ORAL_TABLET | Freq: Every day | ORAL | 1 refills | Status: DC

## 2021-01-14 DIAGNOSIS — Z23 Encounter for immunization: Secondary | ICD-10-CM | POA: Diagnosis not present

## 2021-01-21 DIAGNOSIS — Z23 Encounter for immunization: Secondary | ICD-10-CM | POA: Diagnosis not present

## 2021-02-09 ENCOUNTER — Encounter: Payer: Self-pay | Admitting: Internal Medicine

## 2021-02-10 DIAGNOSIS — I10 Essential (primary) hypertension: Secondary | ICD-10-CM | POA: Diagnosis not present

## 2021-02-10 DIAGNOSIS — R739 Hyperglycemia, unspecified: Secondary | ICD-10-CM | POA: Diagnosis not present

## 2021-02-10 DIAGNOSIS — G250 Essential tremor: Secondary | ICD-10-CM | POA: Diagnosis not present

## 2021-02-10 LAB — LIPID PANEL
Cholesterol: 152 (ref 0–200)
HDL: 34 — AB (ref 35–70)
LDL Cholesterol: 91
LDl/HDL Ratio: 4.4
Triglycerides: 135 (ref 40–160)

## 2021-02-10 LAB — HEMOGLOBIN A1C: Hemoglobin A1C: 6.1

## 2021-02-10 LAB — BASIC METABOLIC PANEL
BUN: 13 (ref 4–21)
CO2: 20 (ref 13–22)
Chloride: 107 (ref 99–108)
Creatinine: 1 (ref 0.6–1.3)
Glucose: 129
Potassium: 4.3 (ref 3.4–5.3)
Sodium: 141 (ref 137–147)

## 2021-02-10 LAB — CBC AND DIFFERENTIAL
HCT: 46 (ref 41–53)
Hemoglobin: 16.1 (ref 13.5–17.5)
Platelets: 310 (ref 150–399)
WBC: 7.2

## 2021-02-10 LAB — HEPATIC FUNCTION PANEL
ALT: 41 — AB (ref 10–40)
AST: 27 (ref 14–40)
Alkaline Phosphatase: 79 (ref 25–125)
Bilirubin, Total: 0.5

## 2021-02-10 LAB — COMPREHENSIVE METABOLIC PANEL
Albumin: 4.4 (ref 3.5–5.0)
Calcium: 9.2 (ref 8.7–10.7)

## 2021-02-10 LAB — CBC: RBC: 4.76 (ref 3.87–5.11)

## 2021-02-14 ENCOUNTER — Non-Acute Institutional Stay: Payer: Medicare Other | Admitting: Adult Health

## 2021-02-14 ENCOUNTER — Other Ambulatory Visit: Payer: Self-pay

## 2021-02-14 ENCOUNTER — Encounter: Payer: Self-pay | Admitting: Adult Health

## 2021-02-14 VITALS — BP 136/74 | HR 83 | Temp 96.1°F | Ht 71.0 in | Wt 239.4 lb

## 2021-02-14 DIAGNOSIS — G8929 Other chronic pain: Secondary | ICD-10-CM

## 2021-02-14 DIAGNOSIS — I10 Essential (primary) hypertension: Secondary | ICD-10-CM

## 2021-02-14 DIAGNOSIS — G250 Essential tremor: Secondary | ICD-10-CM | POA: Diagnosis not present

## 2021-02-14 DIAGNOSIS — M5442 Lumbago with sciatica, left side: Secondary | ICD-10-CM

## 2021-02-14 DIAGNOSIS — E7800 Pure hypercholesterolemia, unspecified: Secondary | ICD-10-CM | POA: Diagnosis not present

## 2021-02-14 DIAGNOSIS — E669 Obesity, unspecified: Secondary | ICD-10-CM

## 2021-02-14 DIAGNOSIS — R739 Hyperglycemia, unspecified: Secondary | ICD-10-CM

## 2021-02-14 NOTE — Progress Notes (Signed)
Location:  [facility]  POS: clinic  Provider:  Cindi Carbon, [place] [phone]   Code Status: DNR Goals of Care:  Advanced Directives 04/21/2020  Does Patient Have a Medical Advance Directive? Yes  Type of Advance Directive Out of facility DNR (pink MOST or yellow form)  Does patient want to make changes to medical advance directive? No - Patient declined  Copy of [place] in Chart? -  Would patient like information on creating a medical advance directive? -  Pre-existing out of facility DNR order (yellow form or pink MOST form) Pink MOST/Yellow Form most recent copy in chart - Physician notified to receive inpatient order     Chief Complaint  Patient presents with   Medical Management of Chronic Issues    Patient returns to the clinic for his 6 month follow up.     HPI: Patient is a 75 y.o. male seen today for medical management of chronic diseases.   [place]ives [place] campus with his [place]. Has two grown sons and 4 grandsons.   Takes losartan for HTN bp runs in the 120s normally  Has seasonal allergies and they have been worse lately with nasal congestion.   Walks occasionally, no real specific exercise program Eats desserts regularly, tries to eat seafood and veggies.  A1C 6.1, fasting cbg 129  Reports his low back hurts sometimes with exacerbations, can run down the left leg. No current issues with pain.   Has essential tremor can effect eating and other ADLs. No issues with gait or rigidity.    Past Medical History:  Diagnosis Date   Allergic rhinitis    Arthritis    Cancer ([place])    basal cell nose   Carotid atherosclerosis, bilateral    dvt  ?14   Cataract    Removal both eyes    DVT (deep venous thrombosis) ([place]) 11/22/2012   GERD (gastroesophageal reflux disease)    occ   High blood pressure    Hypercholesteremia    Hyperlipidemia    Left atrial dilatation    LVH (left ventricular hypertrophy)    Obesity     Seizures ([place])    "in 20's from aspirin"    Past Surgical History:  Procedure Laterality Date   CATARACT EXTRACTION, BILATERAL     COLONOSCOPY  2015   LUMBAR LAMINECTOMY/DECOMPRESSION MICRODISCECTOMY Left 11/30/2016   Procedure: LEFT LUMBAR FOUR-FIVE LAMINECTOMY FOR FACET/SYNOVIAL CYST;  Surgeon: Ashok Pall, MD;  Location: [facility];  Service: Neurosurgery;  Laterality: Left;  LAMINECTOMY FOR FACET/SYNOVIAL CYST LUMBAR 4-LUMBAR 5, LEFT   right leg fracture s/p surgical repair with screws Right 1966   SKIN CANCER EXCISION  2010   from his nose, no recurrence   TOTAL KNEE ARTHROPLASTY Right 2015    Allergies  Allergen Reactions   Asa [Aspirin] Other (See Comments)    Seizures     Outpatient Encounter Medications as of 02/14/2021  Medication Sig   acetaminophen (TYLENOL) 500 MG tablet Take 500 mg by mouth at bedtime as needed for moderate pain.    atorvastatin (LIPITOR) 20 MG tablet Take 1 tablet (20 mg total) by mouth daily.   Cholecalciferol (VITAMIN D3) 2000 units TABS Take 2,000 Units by mouth every evening.    losartan (COZAAR) 100 MG tablet Take 1 tablet (100 mg total) by mouth daily.   naproxen sodium (ALEVE) 220 MG tablet Take 220 mg by mouth daily as needed (pain).   propranolol (INDERAL) 10 MG tablet Take 1  tablet (10 mg total) by mouth daily as needed (tremor). (Patient taking differently: Take 10 mg by mouth daily as needed (tremor). Not taking it)   [DISCONTINUED] COVID-19 mRNA vaccine, Moderna, 100 MCG/0.5ML injection Inject into the muscle.   No facility-administered encounter medications on file as of 02/14/2021.    Review of Systems:  Review of Systems  Constitutional:  Negative for activity change, appetite change, chills, diaphoresis, fatigue, fever and unexpected weight change.  HENT:  Positive for congestion (seasonal allegies.).   Respiratory:  Negative for cough, shortness of breath, wheezing and stridor.   Cardiovascular:  Negative for chest pain,  palpitations and leg swelling.  Gastrointestinal:  Negative for abdominal distention, abdominal pain, constipation and diarrhea.  Genitourinary:  Negative for difficulty urinating and dysuria.  Musculoskeletal:  Positive for back pain. Negative for arthralgias, gait problem, joint swelling and myalgias.  Neurological:  Positive for tremors. Negative for dizziness, seizures, syncope, facial asymmetry, speech difficulty, weakness and headaches.  Hematological:  Negative for adenopathy. Does not bruise/bleed easily.  Psychiatric/Behavioral:  Negative for agitation, behavioral problems and confusion.    Health Maintenance  Topic Date Due   Pneumonia Vaccine 78+ Years old (2 - PPSV23 if available, else PCV20) 09/20/2018   COVID-19 Vaccine (4 - Booster for Moderna series) 10/28/2020   TETANUS/TDAP  04/04/2023   COLONOSCOPY (Pts 45-93yrs Insurance coverage will need to be confirmed)  05/27/2024   INFLUENZA VACCINE  Completed   Hepatitis C Screening  Completed   Zoster Vaccines- Shingrix  Completed   HPV VACCINES  Aged Out    Physical Exam: Vitals:   02/14/21 1405  BP: 136/74  Pulse: 83  Temp: (!) 96.1 F (35.6 C)  SpO2: 95%  Weight: 239 lb 6.4 oz (108.6 kg)  Height: 5\' 11"  (1.803 m)   Body mass index is 33.39 kg/m. Physical Exam Vitals reviewed.  Constitutional:      General: He is not in acute distress.    Appearance: He is not diaphoretic.  HENT:     Head: Normocephalic and atraumatic.     Right Ear: Tympanic membrane normal.     Left Ear: Tympanic membrane normal.     Nose: Nose normal. No congestion.     Mouth/Throat:     Mouth: Mucous membranes are moist.     Pharynx: Oropharynx is clear.  Eyes:     Conjunctiva/sclera: Conjunctivae normal.     Pupils: Pupils are equal, round, and reactive to light.  Neck:     Thyroid: No thyromegaly.     Vascular: No JVD.     Trachea: No tracheal deviation.  Cardiovascular:     Rate and Rhythm: Normal rate and regular rhythm.      Heart sounds: No murmur heard. Pulmonary:     Effort: Pulmonary effort is normal. No respiratory distress.     Breath sounds: Normal breath sounds. No wheezing.  Abdominal:     General: Bowel sounds are normal. There is no distension.     Palpations: Abdomen is soft.     Tenderness: There is no abdominal tenderness.     Comments: Ventral hernia reduces, no tenderness or swelling  Musculoskeletal:        General: No swelling, tenderness, deformity or signs of injury.     Comments: Neg SLR  Lymphadenopathy:     Cervical: No cervical adenopathy.  Skin:    General: Skin is warm and dry.  Neurological:     Mental Status: He is alert and oriented to person,  place, and time.     Cranial Nerves: No cranial nerve deficit.  Psychiatric:        Mood and Affect: Mood normal.    Labs reviewed: Basic Metabolic Panel: Recent Labs    08/12/20 0000 08/12/20 1235  NA  --  144  K  --  4.3  CL  --  107  CO2  --  23*  BUN  --  14  CREATININE  --  1.0  CALCIUM 9.3  --    Liver Function Tests: Recent Labs    08/12/20 0000 08/12/20 1235  AST  --  26  ALT  --  36  ALKPHOS  --  65  ALBUMIN 4.5  --    No results for input(s): LIPASE, AMYLASE in the last 8760 hours. No results for input(s): AMMONIA in the last 8760 hours. CBC: Recent Labs    08/12/20 0000  WBC 6.5  NEUTROABS 3.00  HGB 15.9  HCT 47  PLT 264   Lipid Panel: Recent Labs    08/12/20 0000  CHOL 144  HDL 36  LDLCALC 85  TRIG 114   Lab Results  Component Value Date   HGBA1C 6.1 08/12/2020    Procedures since last visit: No results found.  Assessment/Plan  1. Benign essential tremor Noted on exam, uses propranolol as needed  If worsening could scheduled dosing  2. Essential hypertension, benign Controlled Continue losartan 100 mg qd   3. Hyperglycemia Slight increase in A1c to 6.1 form 5.8 Fasting glucose 129  4. Obesity (BMI 30.0-34.9) Recommend increased activity and reduction in simple carbs.   Reduce simple carbohydrates  Encourage walking 4-5 times per week  5. Chronic left-sided low back pain with left-sided sciatica Has occasional flares which improve with tylenol and aleve  6. Pure hypercholesterolemia LDL and TC at goal  Continue Lipitor 20 mg qd    Up to date on pna vaccine and covid vaccines, says he will provide dates if able    Labs/tests ordered:  * No order type specified * BMP TSH Lipid panel A1C Next appt:  Dr Lyndel Safe in 6 months  Total time 75min  time greater than 50% of total time spent doing pt counseling and coordination of care

## 2021-02-14 NOTE — Patient Instructions (Signed)
Reduce simple carbohydrates   Encourage walking 4-5 times per week

## 2021-02-22 ENCOUNTER — Telehealth: Payer: Self-pay

## 2021-02-22 ENCOUNTER — Ambulatory Visit (INDEPENDENT_AMBULATORY_CARE_PROVIDER_SITE_OTHER): Payer: Medicare Other | Admitting: Nurse Practitioner

## 2021-02-22 ENCOUNTER — Encounter: Payer: Self-pay | Admitting: Nurse Practitioner

## 2021-02-22 ENCOUNTER — Other Ambulatory Visit: Payer: Self-pay

## 2021-02-22 DIAGNOSIS — Z0000 Encounter for general adult medical examination without abnormal findings: Secondary | ICD-10-CM | POA: Diagnosis not present

## 2021-02-22 NOTE — Telephone Encounter (Signed)
Mr. ediberto, sens are scheduled for a virtual visit with your provider today.    Just as we do with appointments in the office, we must obtain your consent to participate.  Your consent will be active for this visit and any virtual visit you may have with one of our providers in the next 365 days.    If you have a MyChart account, I can also send a copy of this consent to you electronically.  All virtual visits are billed to your insurance company just like a traditional visit in the office.  As this is a virtual visit, video technology does not allow for your provider to perform a traditional examination.  This may limit your provider's ability to fully assess your condition.  If your provider identifies any concerns that need to be evaluated in person or the need to arrange testing such as labs, EKG, etc, we will make arrangements to do so.    Although advances in technology are sophisticated, we cannot ensure that it will always work on either your end or our end.  If the connection with a video visit is poor, we may have to switch to a telephone visit.  With either a video or telephone visit, we are not always able to ensure that we have a secure connection.   I need to obtain your verbal consent now.   Are you willing to proceed with your visit today?   JAVONNI MACKE has provided verbal consent on 02/22/2021 for a virtual visit (video or telephone).   Carroll Kinds, [facility] 02/22/2021  9:26 AM

## 2021-02-22 NOTE — Progress Notes (Signed)
This service is provided via telemedicine  No vital signs collected/recorded due to the encounter was a telemedicine visit.   Location of patient (ex: home, work):  Home  Patient consents to a telephone visit:  Yes, see encounter dated 02/22/2021  Location of the provider (ex: office, home):  [place]  Name of any referring provider:  Veleta Miners, MD  Names of all persons participating in the telemedicine service and their role in the encounter:  Sherrie Mustache, Nurse Practitioner, Carroll Kinds, CMA, and patient.   Time spent on call:  8 minutes with medical assistant

## 2021-02-22 NOTE — Progress Notes (Signed)
Subjective:   Colton Hart is a 75 y.o. male who presents for Medicare Annual/Subsequent preventive examination.  Review of Systems    Cardiac Risk Factors include: male gender;advanced age (>69men, >86 women);sedentary lifestyle;hypertension;dyslipidemia;obesity (BMI >30kg/m2)     Objective:    There were no vitals filed for this visit. There is no height or weight on file to calculate BMI.  Advanced Directives 02/22/2021 04/21/2020 02/19/2020 11/26/2019 07/23/2019 05/05/2019 03/26/2019  Does Patient Have a Medical Advance Directive? Yes Yes Yes Yes Yes Yes Yes  Type of Advance Directive Out of facility DNR (pink MOST or yellow form) Out of facility DNR (pink MOST or yellow form) Out of facility DNR (pink MOST or yellow form) Out of facility DNR (pink MOST or yellow form) Out of facility DNR (pink MOST or yellow form) Out of facility DNR (pink MOST or yellow form) Healthcare Power of Attorney  Does patient want to make changes to medical advance directive? No - Patient declined No - Patient declined No - Patient declined No - Guardian declined No - Patient declined No - Patient declined No - Patient declined  Copy of [place] in Chart? - - - - - - Yes - validated most recent copy scanned in chart (See row information)  Would patient like information on creating a medical advance directive? - - - - - - -  Pre-existing out of facility DNR order (yellow form or pink MOST form) Yellow form placed in chart (order not valid for inpatient use);Pink MOST form placed in chart (order not valid for inpatient use) Pink MOST/Yellow Form most recent copy in chart - Physician notified to receive inpatient order - Pink MOST/Yellow Form most recent copy in chart - Physician notified to receive inpatient order - - -    Current Medications (verified) Outpatient Encounter Medications as of 02/22/2021  Medication Sig   acetaminophen (TYLENOL) 500 MG tablet Take 500 mg by mouth at bedtime as  needed for moderate pain.    atorvastatin (LIPITOR) 20 MG tablet Take 1 tablet (20 mg total) by mouth daily.   Cholecalciferol (VITAMIN D3) 2000 units TABS Take 2,000 Units by mouth every evening.    losartan (COZAAR) 100 MG tablet Take 1 tablet (100 mg total) by mouth daily.   naproxen sodium (ALEVE) 220 MG tablet Take 220 mg by mouth daily as needed (pain).   propranolol (INDERAL) 10 MG tablet Take 1 tablet (10 mg total) by mouth daily as needed (tremor).   No facility-administered encounter medications on file as of 02/22/2021.    Allergies (verified) Asa [aspirin]   History: Past Medical History:  Diagnosis Date   Allergic rhinitis    Arthritis    Cancer ([place])    basal cell nose   Carotid atherosclerosis, bilateral    dvt  ?14   Cataract    Removal both eyes    DVT (deep venous thrombosis) ([place]) 11/22/2012   GERD (gastroesophageal reflux disease)    occ   High blood pressure    Hypercholesteremia    Hyperlipidemia    Left atrial dilatation    LVH (left ventricular hypertrophy)    Obesity    Seizures ([place])    "in 20's from aspirin"   Past Surgical History:  Procedure Laterality Date   CATARACT EXTRACTION, BILATERAL     COLONOSCOPY  2015   LUMBAR LAMINECTOMY/DECOMPRESSION MICRODISCECTOMY Left 11/30/2016   Procedure: LEFT LUMBAR FOUR-FIVE LAMINECTOMY FOR FACET/SYNOVIAL CYST;  Surgeon: Ashok Pall, MD;  Location: [facility]  OR;  Service: Neurosurgery;  Laterality: Left;  LAMINECTOMY FOR FACET/SYNOVIAL CYST LUMBAR 4-LUMBAR 5, LEFT   right leg fracture s/p surgical repair with screws Right 1966   SKIN CANCER EXCISION  2010   from his nose, no recurrence   TOTAL KNEE ARTHROPLASTY Right 2015   Family History  Problem Relation Age of Onset   Alzheimer's disease Mother    Cancer Mother        Skin   Alzheimer's disease Father    Cancer Father        colon, liver, skin   Colon polyps Father    Colon cancer Father    Esophageal cancer Neg Hx    Rectal cancer Neg Hx     Stomach cancer Neg Hx    Social History   Socioeconomic History   Marital status: Married    Spouse name: Not on file   Number of children: Not on file   Years of education: Not on file   Highest education level: Not on file  Occupational History   Not on file  Tobacco Use   Smoking status: Former    Packs/day: 1.00    Years: 20.00    Pack years: 20.00    Types: Cigarettes    Quit date: 11/27/1988    Years since quitting: 32.2   Smokeless tobacco: Never  Vaping Use   Vaping Use: Never used  Substance and Sexual Activity   Alcohol use: No   Drug use: No   Sexual activity: Not on file  Other Topics Concern   Not on file  Social History Narrative   Diet?        Do you drink/eat things with caffeine? Yes      Marital status?            Married                        What year were you married? 2001      Do you live in a house, apartment, assisted living, condo, trailer, etc.? home      Is it one or more stories? no      How many persons live in your home? 2      Do you have any pets in your home? (please list) no      Current or past profession: IT consultant      Do you exercise?      No                                Type & how often? Walk 1-2 miles, 1-2 times per week      Do you have a living will? No      Do you have a DNR form?    No                              If not, do you want to discuss one? Yes      Do you have signed POA/HPOA for forms?  Yes   Social Determinants of Health   Financial Resource Strain: Not on file  Food Insecurity: Not on file  Transportation Needs: Not on file  Physical Activity: Not on file  Stress: Not on file  Social Connections: Not on file    Tobacco Counseling Counseling given: Not Answered   Clinical  Intake:  Pre-visit preparation completed: Yes  Pain : No/denies pain     BMI - recorded: 33.3 Nutritional Status: BMI > 30  Obese Diabetes: No  How often do you need to have someone help you when you read  instructions, pamphlets, or other written materials from your doctor or pharmacy?: 1 - Never  Diabetic?no         Activities of Daily Living In your present state of health, do you have any difficulty performing the following activities: 02/22/2021  Hearing? N  Vision? N  Difficulty concentrating or making decisions? Y  Comment mild  Walking or climbing stairs? N  Dressing or bathing? N  Doing errands, shopping? N  Preparing Food and eating ? N  Using the Toilet? N  In the past six months, have you accidently leaked urine? N  Do you have problems with loss of bowel control? N  Managing your Medications? N  Managing your Finances? N  Housekeeping or managing your Housekeeping? N  Some recent data might be hidden    Patient Care Team: Virgie Dad, MD as PCP - General (Internal Medicine)  Indicate any recent Medical Services you may have received from other than [facility] providers in the past year (date may be approximate).     Assessment:   This is a routine wellness examination for [place].  Hearing/Vision screen Hearing Screening - Comments:: No problems. Vision Screening - Comments:: No vision problems. Patient has had eye exam within past year. Patient sees Dr. Ellie Lunch  Dietary issues and exercise activities discussed: Current Exercise Habits: The patient does not participate in regular exercise at present   Goals Addressed   None    Depression Screen PHQ 2/9 Scores 02/22/2021 04/21/2020 02/19/2020 07/23/2019 05/05/2019 03/26/2019 01/23/2019  PHQ - 2 Score 0 0 0 0 0 0 0    Fall Risk Fall Risk  02/22/2021 08/18/2020 04/21/2020 02/19/2020 11/26/2019  Falls in the past year? 0 0 0 0 0  Number falls in past yr: 0 0 0 0 -  Injury with Fall? 0 - 0 0 -  Risk for fall due to : No Fall Risks - - - -  Follow up Falls evaluation completed - - - -    FALL RISK PREVENTION PERTAINING TO THE HOME:  Any stairs in or around the home? No  If so, are there any without handrails?  No  Home free of loose throw rugs in walkways, pet beds, electrical cords, etc? Yes  Adequate lighting in your home to reduce risk of falls? Yes   ASSISTIVE DEVICES UTILIZED TO PREVENT FALLS:  Life alert? No  Use of a cane, walker or w/c? No  Grab bars in the bathroom? Yes  Shower chair or bench in shower? No  Elevated toilet seat or a handicapped toilet? Yes   TIMED UP AND GO:  Was the test performed? No .    Cognitive Function: MMSE - Mini Mental State Exam 02/05/2018 01/09/2017  Orientation to time 5 5  Orientation to Place 5 5  Registration 3 3  Attention/ Calculation 5 5  Recall 3 3  Language- name 2 objects 2 2  Language- repeat 1 1  Language- follow 3 step command 3 3  Language- read & follow direction 1 1  Write a sentence 1 1  Copy design 1 1  Total score 30 30     6CIT Screen 02/22/2021 02/19/2020 01/23/2019  What Year? 0 points 0 points 0 points  What month? 0  points 0 points 0 points  What time? 0 points 0 points 0 points  Count back from 20 0 points 0 points 0 points  Months in reverse 0 points 0 points 0 points  Repeat phrase 0 points 0 points 2 points  Total Score 0 0 2    Immunizations Immunization History  Administered Date(s) Administered   Hep A / Hep B 04/03/2002   Influenza, High Dose Seasonal PF 01/09/2014, 01/07/2015, 01/09/2018, 01/10/2019, 01/30/2020   Influenza-Unspecified 02/18/2016, 01/22/2017, 01/21/2021   Moderna SARS-COV2 Booster Vaccination 09/02/2020   Moderna Sars-Covid-2 Vaccination 04/15/2019, 05/14/2019, 02/17/2020, 09/02/2020   Pneumococcal Conjugate-13 09/19/2017   Pneumococcal-Unspecified 09/30/2013, 01/02/2015   Tdap 04/03/2013   Zoster Recombinat (Shingrix) 08/18/2017, 10/20/2017, 11/29/2017    TDAP status: Up to date  Flu Vaccine status: Up to date  Pneumococcal vaccine status: Up to date  Covid-19 vaccine status: Information provided on how to obtain vaccines.   Qualifies for Shingles Vaccine? Yes   Zostavax  completed No   Shingrix Completed?: Yes  Screening Tests Health Maintenance  Topic Date Due   Pneumonia Vaccine 73+ Years old (2 - PPSV23 if available, else PCV20) 09/20/2018   COVID-19 Vaccine (4 - Booster for Moderna series) 10/28/2020   TETANUS/TDAP  04/04/2023   COLONOSCOPY (Pts 45-72yrs Insurance coverage will need to be confirmed)  05/27/2024   INFLUENZA VACCINE  Completed   Hepatitis C Screening  Completed   Zoster Vaccines- Shingrix  Completed   HPV VACCINES  Aged Out    Health Maintenance  Health Maintenance Due  Topic Date Due   Pneumonia Vaccine 60+ Years old (2 - PPSV23 if available, else PCV20) 09/20/2018   COVID-19 Vaccine (4 - Booster for Moderna series) 10/28/2020    Colorectal cancer screening: Type of screening: Colonoscopy. Completed 2021. Repeat every 5 years  Lung Cancer Screening: (Low Dose CT Chest recommended if Age 83-80 years, 30 pack-year currently smoking OR have quit w/in 15years.) does not qualify.   Lung Cancer Screening Referral: na  Additional Screening:  Hepatitis C Screening: does qualify; Completed   Vision Screening: Recommended annual ophthalmology exams for early detection of glaucoma and other disorders of the eye. Is the patient up to date with their annual eye exam?  Yes  Who is the provider or what is the name of the office in which the patient attends annual eye exams? [email or web address] If pt is not established with a provider, would they like to be referred to a provider to establish care? No .   Dental Screening: Recommended annual dental exams for proper oral hygiene  Community Resource Referral / Chronic Care Management: CRR required this visit?  No   CCM required this visit?  No      Plan:     I have personally reviewed and noted the following in the patient's chart:   Medical and social history Use of alcohol, tobacco or illicit drugs  Current medications and supplements including opioid prescriptions. Patient is not  currently taking opioid prescriptions. Functional ability and status Nutritional status Physical activity Advanced directives List of other physicians Hospitalizations, surgeries, and ER visits in previous 12 months Vitals Screenings to include cognitive, depression, and falls Referrals and appointments  In addition, I have reviewed and discussed with patient certain preventive protocols, quality metrics, and best practice recommendations. A written personalized care plan for preventive services as well as general preventive health recommendations were provided to patient.     Lauree Chandler, NP   02/22/2021  Virtual Visit via Telephone Note  I connected withNAME@ on 02/22/21 at  9:30 AM EST by telephone and verified that I am speaking with the correct person using two identifiers.  Location: Patient: home Provider: [facility]   I discussed the limitations, risks, security and privacy concerns of performing an evaluation and management service by telephone and the availability of in person appointments. I also discussed with the patient that there may be a patient responsible charge related to this service. The patient expressed understanding and agreed to proceed.   I discussed the assessment and treatment plan with the patient. The patient was provided an opportunity to ask questions and all were answered. The patient agreed with the plan and demonstrated an understanding of the instructions.   The patient was advised to call back or seek an in-person evaluation if the symptoms worsen or if the condition fails to improve as anticipated.  I provided 15 minutes of non-face-to-face time during this encounter.  Carlos American. Harle Battiest Avs printed and mailed

## 2021-02-22 NOTE — Patient Instructions (Signed)
Colton Hart , Thank you for taking time to come for your Medicare Wellness Visit. I appreciate your ongoing commitment to your health goals. Please review the following plan we discussed and let me know if I can assist you in the future.   Screening recommendations/referrals: Colonoscopy up to date Recommended yearly ophthalmology/optometry visit for glaucoma screening and checkup Recommended yearly dental visit for hygiene and checkup  Vaccinations: Influenza vaccine up to date Pneumococcal vaccine up to date Tdap vaccine up to date Shingles vaccine up to date    Advanced directives: on file   Conditions/risks identified: on file  Next appointment: yearly for awv  Preventive Care 11 Years and Older, Male Preventive care refers to lifestyle choices and visits with your health care provider that can promote health and wellness. What does preventive care include? A yearly physical exam. This is also called an annual well check. Dental exams once or twice a year. Routine eye exams. Ask your health care provider how often you should have your eyes checked. Personal lifestyle choices, including: Daily care of your teeth and gums. Regular physical activity. Eating a healthy diet. Avoiding tobacco and drug use. Limiting alcohol use. Practicing safe sex. Taking low doses of aspirin every day. Taking vitamin and mineral supplements as recommended by your health care provider. What happens during an annual well check? The services and screenings done by your health care provider during your annual well check will depend on your age, overall health, lifestyle risk factors, and family history of disease. Counseling  Your health care provider may ask you questions about your: Alcohol use. Tobacco use. Drug use. Emotional well-being. Home and relationship well-being. Sexual activity. Eating habits. History of falls. Memory and ability to understand (cognition). Work and work  Statistician. Screening  You may have the following tests or measurements: Height, weight, and BMI. Blood pressure. Lipid and cholesterol levels. These may be checked every 5 years, or more frequently if you are over 4 years old. Skin check. Lung cancer screening. You may have this screening every year starting at age 72 if you have a 30-pack-year history of smoking and currently smoke or have quit within the past 15 years. Fecal occult blood test (FOBT) of the stool. You may have this test every year starting at age 31. Flexible sigmoidoscopy or colonoscopy. You may have a sigmoidoscopy every 5 years or a colonoscopy every 10 years starting at age 62. Prostate cancer screening. Recommendations will vary depending on your family history and other risks. Hepatitis C blood test. Hepatitis B blood test. Sexually transmitted disease (STD) testing. Diabetes screening. This is done by checking your blood sugar (glucose) after you have not eaten for a while (fasting). You may have this done every 1-3 years. Abdominal aortic aneurysm (AAA) screening. You may need this if you are a current or former smoker. Osteoporosis. You may be screened starting at age 28 if you are at high risk. Talk with your health care provider about your test results, treatment options, and if necessary, the need for more tests. Vaccines  Your health care provider may recommend certain vaccines, such as: Influenza vaccine. This is recommended every year. Tetanus, diphtheria, and acellular pertussis (Tdap, Td) vaccine. You may need a Td booster every 10 years. Zoster vaccine. You may need this after age 20. Pneumococcal 13-valent conjugate (PCV13) vaccine. One dose is recommended after age 37. Pneumococcal polysaccharide (PPSV23) vaccine. One dose is recommended after age 83. Talk to your health care provider about which screenings  and vaccines you need and how often you need them. This information is not intended to replace  advice given to you by your health care provider. Make sure you discuss any questions you have with your health care provider. Document Released: 04/16/2015 Document Revised: 12/08/2015 Document Reviewed: 01/19/2015 Elsevier Interactive Patient Education  2017 [employer] Prevention in the Home Falls can cause injuries. They can happen to people of all ages. There are many things you can do to make your home safe and to help prevent falls. What can I do on the outside of my home? Regularly fix the edges of walkways and driveways and fix any cracks. Remove anything that might make you trip as you walk through a door, such as a raised step or threshold. Trim any bushes or trees on the path to your home. Use bright outdoor lighting. Clear any walking paths of anything that might make someone trip, such as rocks or tools. Regularly check to see if handrails are loose or broken. Make sure that both sides of any steps have handrails. Any raised decks and porches should have guardrails on the edges. Have any leaves, snow, or ice cleared regularly. Use sand or salt on walking paths during winter. Clean up any spills in your garage right away. This includes oil or grease spills. What can I do in the bathroom? Use night lights. Install grab bars by the toilet and in the tub and shower. Do not use towel bars as grab bars. Use non-skid mats or decals in the tub or shower. If you need to sit down in the shower, use a plastic, non-slip stool. Keep the floor dry. Clean up any water that spills on the floor as soon as it happens. Remove soap buildup in the tub or shower regularly. Attach bath mats securely with double-sided non-slip rug tape. Do not have throw rugs and other things on the floor that can make you trip. What can I do in the bedroom? Use night lights. Make sure that you have a light by your bed that is easy to reach. Do not use any sheets or blankets that are too big for your bed.  They should not hang down onto the floor. Have a firm chair that has side arms. You can use this for support while you get dressed. Do not have throw rugs and other things on the floor that can make you trip. What can I do in the kitchen? Clean up any spills right away. Avoid walking on wet floors. Keep items that you use a lot in easy-to-reach places. If you need to reach something above you, use a strong step stool that has a grab bar. Keep electrical cords out of the way. Do not use floor polish or wax that makes floors slippery. If you must use wax, use non-skid floor wax. Do not have throw rugs and other things on the floor that can make you trip. What can I do with my stairs? Do not leave any items on the stairs. Make sure that there are handrails on both sides of the stairs and use them. Fix handrails that are broken or loose. Make sure that handrails are as long as the stairways. Check any carpeting to make sure that it is firmly attached to the stairs. Fix any carpet that is loose or worn. Avoid having throw rugs at the top or bottom of the stairs. If you do have throw rugs, attach them to the floor with carpet tape.  Make sure that you have a light switch at the top of the stairs and the bottom of the stairs. If you do not have them, ask someone to add them for you. What else can I do to help prevent falls? Wear shoes that: Do not have high heels. Have rubber bottoms. Are comfortable and fit you well. Are closed at the toe. Do not wear sandals. If you use a stepladder: Make sure that it is fully opened. Do not climb a closed stepladder. Make sure that both sides of the stepladder are locked into place. Ask someone to hold it for you, if possible. Clearly mark and make sure that you can see: Any grab bars or handrails. First and last steps. Where the edge of each step is. Use tools that help you move around (mobility aids) if they are needed. These  include: Canes. Walkers. Scooters. Crutches. Turn on the lights when you go into a dark area. Replace any light bulbs as soon as they burn out. Set up your furniture so you have a clear path. Avoid moving your furniture around. If any of your floors are uneven, fix them. If there are any pets around you, be aware of where they are. Review your medicines with your doctor. Some medicines can make you feel dizzy. This can increase your chance of falling. Ask your doctor what other things that you can do to help prevent falls. This information is not intended to replace advice given to you by your health care provider. Make sure you discuss any questions you have with your health care provider. Document Released: 01/14/2009 Document Revised: 08/26/2015 Document Reviewed: 04/24/2014 Elsevier Interactive Patient Education  2017 [employer].

## 2021-02-25 ENCOUNTER — Encounter: Payer: Medicare Other | Admitting: Nurse Practitioner

## 2021-04-23 ENCOUNTER — Other Ambulatory Visit: Payer: Self-pay | Admitting: Internal Medicine

## 2021-04-23 DIAGNOSIS — E7800 Pure hypercholesterolemia, unspecified: Secondary | ICD-10-CM

## 2021-05-04 ENCOUNTER — Other Ambulatory Visit: Payer: Self-pay | Admitting: Internal Medicine

## 2021-05-04 DIAGNOSIS — I10 Essential (primary) hypertension: Secondary | ICD-10-CM

## 2021-08-19 ENCOUNTER — Encounter: Payer: Self-pay | Admitting: Internal Medicine

## 2021-08-23 DIAGNOSIS — E7800 Pure hypercholesterolemia, unspecified: Secondary | ICD-10-CM | POA: Diagnosis not present

## 2021-08-23 DIAGNOSIS — I10 Essential (primary) hypertension: Secondary | ICD-10-CM | POA: Diagnosis not present

## 2021-08-23 DIAGNOSIS — R739 Hyperglycemia, unspecified: Secondary | ICD-10-CM | POA: Diagnosis not present

## 2021-08-23 LAB — BASIC METABOLIC PANEL
BUN: 9 (ref 4–21)
CO2: 22 (ref 13–22)
Chloride: 105 (ref 99–108)
Creatinine: 1 (ref 0.6–1.3)
Glucose: 137
Potassium: 4.6 mEq/L (ref 3.5–5.1)
Sodium: 143 (ref 137–147)

## 2021-08-23 LAB — TSH: TSH: 2.71 (ref 0.41–5.90)

## 2021-08-23 LAB — COMPREHENSIVE METABOLIC PANEL: Calcium: 9.6 (ref 8.7–10.7)

## 2021-08-23 LAB — LIPID PANEL
Cholesterol: 142 (ref 0–200)
HDL: 34 — AB (ref 35–70)
LDL Cholesterol: 88
Triglycerides: 105 (ref 40–160)

## 2021-08-23 LAB — HEMOGLOBIN A1C: Hemoglobin A1C: 6.4

## 2021-08-24 ENCOUNTER — Encounter: Payer: Medicare Other | Admitting: Internal Medicine

## 2021-08-31 ENCOUNTER — Encounter: Payer: Self-pay | Admitting: Internal Medicine

## 2021-08-31 ENCOUNTER — Non-Acute Institutional Stay: Payer: Medicare Other | Admitting: Internal Medicine

## 2021-08-31 VITALS — BP 124/82 | HR 87 | Temp 97.1°F | Ht 71.0 in | Wt 238.5 lb

## 2021-08-31 DIAGNOSIS — R350 Frequency of micturition: Secondary | ICD-10-CM | POA: Diagnosis not present

## 2021-08-31 DIAGNOSIS — N401 Enlarged prostate with lower urinary tract symptoms: Secondary | ICD-10-CM

## 2021-08-31 DIAGNOSIS — E669 Obesity, unspecified: Secondary | ICD-10-CM | POA: Diagnosis not present

## 2021-08-31 DIAGNOSIS — G8929 Other chronic pain: Secondary | ICD-10-CM | POA: Diagnosis not present

## 2021-08-31 DIAGNOSIS — M5442 Lumbago with sciatica, left side: Secondary | ICD-10-CM

## 2021-08-31 DIAGNOSIS — G250 Essential tremor: Secondary | ICD-10-CM | POA: Diagnosis not present

## 2021-08-31 DIAGNOSIS — I10 Essential (primary) hypertension: Secondary | ICD-10-CM

## 2021-08-31 DIAGNOSIS — I872 Venous insufficiency (chronic) (peripheral): Secondary | ICD-10-CM | POA: Diagnosis not present

## 2021-08-31 DIAGNOSIS — R609 Edema, unspecified: Secondary | ICD-10-CM | POA: Diagnosis not present

## 2021-08-31 DIAGNOSIS — R7303 Prediabetes: Secondary | ICD-10-CM

## 2021-08-31 DIAGNOSIS — E7800 Pure hypercholesterolemia, unspecified: Secondary | ICD-10-CM | POA: Diagnosis not present

## 2021-08-31 DIAGNOSIS — M25511 Pain in right shoulder: Secondary | ICD-10-CM | POA: Diagnosis not present

## 2021-08-31 DIAGNOSIS — M25512 Pain in left shoulder: Secondary | ICD-10-CM

## 2021-08-31 MED ORDER — METFORMIN HCL ER 500 MG PO TB24
500.0000 mg | ORAL_TABLET | Freq: Every day | ORAL | 3 refills | Status: DC
Start: 2021-08-31 — End: 2022-01-23

## 2021-08-31 NOTE — Patient Instructions (Signed)
I will make referal to [facility] and Dermatologist

## 2021-08-31 NOTE — Progress Notes (Addendum)
Location:  [facility] of Service:  Clinic (12)  Provider:   Code Status:  Goals of Care:     08/31/2021    8:45 AM  Advanced Directives  Does Patient Have a Medical Advance Directive? Yes  Type of Advance Directive Out of facility DNR (pink MOST or yellow form)  Does patient want to make changes to medical advance directive? No - Patient declined  Pre-existing out of facility DNR order (yellow form or pink MOST form) Pink MOST form placed in chart (order not valid for inpatient use)     Chief Complaint  Patient presents with   Medical Management of Chronic Issues    6 month follow up.    Quality Metric Gaps    Discuss the need for Pneumonia vaccine and additional Covid booster, or post pone if patient refuses.     HPI: Patient is a 76 y.o. male seen today for medical management of chronic diseases.    Lower Leg Swelling and Redness Notices more when he is walking  Leg becomes red No Pain.  Right more then left. Has h/o Knee Surgery and lower leg surgery Mild swelling.   Nevi in back of his Right Thigh  Prediabetes A1C now 6.4 Not very active and Cant follow lot of diet due to [facility]  Has not lost any weight  Back pain resolved for now Essential tremors Hypertension and h/o BPH  Lives in [place]. With his wife. Drives.No cane or Walker  Past Medical History:  Diagnosis Date   Allergic rhinitis    Arthritis    Cancer ([place])    basal cell nose   Carotid atherosclerosis, bilateral    dvt  ?14   Cataract    Removal both eyes    DVT (deep venous thrombosis) ([facility]) 11/22/2012   GERD (gastroesophageal reflux disease)    occ   High blood pressure    Hypercholesteremia    Hyperlipidemia    Left atrial dilatation    LVH (left ventricular hypertrophy)    Obesity    Seizures ([place])    "in 20's from aspirin"    Past Surgical History:  Procedure Laterality Date   CATARACT EXTRACTION, BILATERAL     COLONOSCOPY  2015   LUMBAR  LAMINECTOMY/DECOMPRESSION MICRODISCECTOMY Left 11/30/2016   Procedure: LEFT LUMBAR FOUR-FIVE LAMINECTOMY FOR FACET/SYNOVIAL CYST;  Surgeon: Ashok Pall, MD;  Location: [facility];  Service: Neurosurgery;  Laterality: Left;  LAMINECTOMY FOR FACET/SYNOVIAL CYST LUMBAR 4-LUMBAR 5, LEFT   right leg fracture s/p surgical repair with screws Right 1966   SKIN CANCER EXCISION  2010   from his nose, no recurrence   TOTAL KNEE ARTHROPLASTY Right 2015    Allergies  Allergen Reactions   Asa [Aspirin] Other (See Comments)    Seizures     Outpatient Encounter Medications as of 08/31/2021  Medication Sig   acetaminophen (TYLENOL) 500 MG tablet Take 500 mg by mouth at bedtime as needed for moderate pain.    atorvastatin (LIPITOR) 20 MG tablet TAKE 1 TABLET BY MOUTH  DAILY   Cholecalciferol (VITAMIN D3) 2000 units TABS Take 1,000 Units by mouth every evening.   losartan (COZAAR) 100 MG tablet TAKE 1 TABLET BY MOUTH  DAILY   naproxen sodium (ALEVE) 220 MG tablet Take 220 mg by mouth daily as needed (pain).   propranolol (INDERAL) 10 MG tablet Take 1 tablet (10 mg total) by mouth daily as needed (tremor).   No facility-administered encounter medications on  file as of 08/31/2021.    Review of Systems:  Review of Systems  Constitutional:  Negative for activity change, appetite change and unexpected weight change.  HENT: Negative.    Respiratory:  Negative for cough and shortness of breath.   Cardiovascular:  Positive for leg swelling.  Gastrointestinal:  Negative for constipation.  Genitourinary:  Negative for frequency.  Musculoskeletal:  Negative for arthralgias, gait problem and myalgias.  Skin:  Positive for color change. Negative for rash.  Neurological:  Negative for dizziness and weakness.  Psychiatric/Behavioral:  Negative for confusion and sleep disturbance.   All other systems reviewed and are negative.  Health Maintenance  Topic Date Due   Pneumonia Vaccine 1+ Years old (2 - PPSV23 if  available, else PCV20) 09/20/2018   COVID-19 Vaccine (5 - Booster for Moderna series) 10/28/2020   INFLUENZA VACCINE  11/01/2021   TETANUS/TDAP  04/04/2023   COLONOSCOPY (Pts 45-53yr Insurance coverage will need to be confirmed)  05/27/2024   Hepatitis C Screening  Completed   Zoster Vaccines- Shingrix  Completed   HPV VACCINES  Aged Out    Physical Exam: Vitals:   08/31/21 0840  BP: 124/82  Pulse: 87  Temp: (!) 97.1 F (36.2 C)  TempSrc: Temporal  SpO2: 95%  Weight: 238 lb 8 oz (108.2 kg)  Height: '5\' 11"'$  (1.803 m)   Body mass index is 33.26 kg/m. Physical Exam Vitals reviewed.  Constitutional:      Appearance: Normal appearance.  HENT:     Head: Normocephalic.     Nose: Nose normal.     Mouth/Throat:     Mouth: Mucous membranes are moist.     Pharynx: Oropharynx is clear.  Eyes:     Pupils: Pupils are equal, round, and reactive to light.  Cardiovascular:     Rate and Rhythm: Normal rate and regular rhythm.     Pulses: Normal pulses.     Heart sounds: No murmur heard. Pulmonary:     Effort: Pulmonary effort is normal. No respiratory distress.     Breath sounds: Normal breath sounds. No rales.  Abdominal:     General: Abdomen is flat. Bowel sounds are normal.     Palpations: Abdomen is soft.  Musculoskeletal:     Cervical back: Neck supple.     Comments: Mild Swelling bilateral Right more then Left Good pulses. No Sensory loss No open wounds. Does have Varicose veins   Skin:    General: Skin is warm.     Comments: Small Nevi in his right upper thigh Not raised no irregular borders  Neurological:     General: No focal deficit present.     Mental Status: He is alert and oriented to person, place, and time.  Psychiatric:        Mood and Affect: Mood normal.        Thought Content: Thought content normal.    Labs reviewed: Basic Metabolic Panel: Recent Labs    02/10/21 0000 08/23/21 0000  NA 141 143  K 4.3 4.6  CL 107 105  CO2 20 22  BUN 13 9   CREATININE 1.0 1.0  CALCIUM 9.2 9.6  TSH  --  2.71   Liver Function Tests: Recent Labs    02/10/21 0000  AST 27  ALT 41*  ALKPHOS 79  ALBUMIN 4.4   No results for input(s): LIPASE, AMYLASE in the last 8760 hours. No results for input(s): AMMONIA in the last 8760 hours. CBC: Recent Labs  02/10/21 0000  WBC 7.2  HGB 16.1  HCT 46  PLT 310   Lipid Panel: Recent Labs    02/10/21 0000 08/23/21 0000  CHOL 152 142  HDL 34* 34*  LDLCALC 91 88  TRIG 135 105   Lab Results  Component Value Date   HGBA1C 6.4 08/23/2021    Procedures since last visit: No results found.  Assessment/Plan 1. Prediabetes Started on metformin Foot exam done Upto date on Eye exam  2. Venous (peripheral) insufficiency Most likely cause of his redness Elevated Legs. Compression stockings  Also made referal to Podiatrist in facility 3. Mild edema Compression stockings Use to b on HCTZ before Taken off due to Orthostatics  4. Benign essential tremor Uses Inderal PRN  5. Essential hypertension, benign Doing well with Cozaar  6. Obesity (BMI 30.0-34.9) Encourage Diet and Exercise  7. Chronic left-sided low back pain with left-sided sciatica Resolved after Laminectomy from Dr Shelly Rubenstein   8. Pure hypercholesterolemia Doing well with statin  9. Chronic pain of both shoulders resolved  10. Benign prostatic hyperplasia with urinary frequency Off Flomax Doing well 11 Nevi Made referal to Derm in facility  for Skin check   Labs/tests ordered:  CBC,BMP,A1C,Urine for Microalbuminuria Next appt:  Visit date not found

## 2021-09-20 DIAGNOSIS — Q6689 Other  specified congenital deformities of feet: Secondary | ICD-10-CM | POA: Diagnosis not present

## 2021-09-20 DIAGNOSIS — E119 Type 2 diabetes mellitus without complications: Secondary | ICD-10-CM | POA: Diagnosis not present

## 2021-09-20 DIAGNOSIS — L309 Dermatitis, unspecified: Secondary | ICD-10-CM | POA: Diagnosis not present

## 2021-09-21 DIAGNOSIS — L821 Other seborrheic keratosis: Secondary | ICD-10-CM | POA: Diagnosis not present

## 2021-09-21 DIAGNOSIS — L814 Other melanin hyperpigmentation: Secondary | ICD-10-CM | POA: Diagnosis not present

## 2021-09-21 DIAGNOSIS — L570 Actinic keratosis: Secondary | ICD-10-CM | POA: Diagnosis not present

## 2021-09-21 DIAGNOSIS — Z85828 Personal history of other malignant neoplasm of skin: Secondary | ICD-10-CM | POA: Diagnosis not present

## 2021-09-21 DIAGNOSIS — D225 Melanocytic nevi of trunk: Secondary | ICD-10-CM | POA: Diagnosis not present

## 2021-10-10 ENCOUNTER — Encounter: Payer: Self-pay | Admitting: Internal Medicine

## 2021-10-11 ENCOUNTER — Other Ambulatory Visit: Payer: Self-pay | Admitting: Internal Medicine

## 2021-10-11 DIAGNOSIS — H9193 Unspecified hearing loss, bilateral: Secondary | ICD-10-CM

## 2021-10-19 DIAGNOSIS — L821 Other seborrheic keratosis: Secondary | ICD-10-CM | POA: Diagnosis not present

## 2021-10-19 DIAGNOSIS — L579 Skin changes due to chronic exposure to nonionizing radiation, unspecified: Secondary | ICD-10-CM | POA: Diagnosis not present

## 2021-10-19 DIAGNOSIS — Z85828 Personal history of other malignant neoplasm of skin: Secondary | ICD-10-CM | POA: Diagnosis not present

## 2021-10-27 DIAGNOSIS — H903 Sensorineural hearing loss, bilateral: Secondary | ICD-10-CM | POA: Diagnosis not present

## 2021-10-27 DIAGNOSIS — H9313 Tinnitus, bilateral: Secondary | ICD-10-CM | POA: Diagnosis not present

## 2021-11-03 ENCOUNTER — Other Ambulatory Visit: Payer: Self-pay | Admitting: Internal Medicine

## 2021-11-03 DIAGNOSIS — E7800 Pure hypercholesterolemia, unspecified: Secondary | ICD-10-CM

## 2021-11-15 DIAGNOSIS — H524 Presbyopia: Secondary | ICD-10-CM | POA: Diagnosis not present

## 2021-11-15 DIAGNOSIS — H532 Diplopia: Secondary | ICD-10-CM | POA: Diagnosis not present

## 2021-11-15 DIAGNOSIS — E119 Type 2 diabetes mellitus without complications: Secondary | ICD-10-CM | POA: Diagnosis not present

## 2021-11-15 DIAGNOSIS — Z961 Presence of intraocular lens: Secondary | ICD-10-CM | POA: Diagnosis not present

## 2021-11-20 ENCOUNTER — Other Ambulatory Visit: Payer: Self-pay | Admitting: Internal Medicine

## 2021-11-20 DIAGNOSIS — I10 Essential (primary) hypertension: Secondary | ICD-10-CM

## 2021-12-21 DIAGNOSIS — L821 Other seborrheic keratosis: Secondary | ICD-10-CM | POA: Diagnosis not present

## 2021-12-21 DIAGNOSIS — Z85828 Personal history of other malignant neoplasm of skin: Secondary | ICD-10-CM | POA: Diagnosis not present

## 2021-12-21 DIAGNOSIS — L570 Actinic keratosis: Secondary | ICD-10-CM | POA: Diagnosis not present

## 2021-12-21 DIAGNOSIS — L814 Other melanin hyperpigmentation: Secondary | ICD-10-CM | POA: Diagnosis not present

## 2022-01-02 ENCOUNTER — Other Ambulatory Visit (HOSPITAL_BASED_OUTPATIENT_CLINIC_OR_DEPARTMENT_OTHER): Payer: Self-pay

## 2022-01-02 ENCOUNTER — Encounter: Payer: Medicare Other | Admitting: Adult Health

## 2022-01-02 MED ORDER — INFLUENZA VAC A&B SA ADJ QUAD 0.5 ML IM PRSY
PREFILLED_SYRINGE | INTRAMUSCULAR | 0 refills | Status: DC
  Filled 2022-01-02: qty 0.5, 1d supply, fill #0

## 2022-01-03 ENCOUNTER — Other Ambulatory Visit (HOSPITAL_BASED_OUTPATIENT_CLINIC_OR_DEPARTMENT_OTHER): Payer: Self-pay

## 2022-01-03 DIAGNOSIS — Z23 Encounter for immunization: Secondary | ICD-10-CM | POA: Diagnosis not present

## 2022-01-03 NOTE — Progress Notes (Signed)
This encounter was created in error - please disregard.

## 2022-01-09 ENCOUNTER — Encounter: Payer: Medicare Other | Admitting: Adult Health

## 2022-01-17 DIAGNOSIS — I10 Essential (primary) hypertension: Secondary | ICD-10-CM | POA: Diagnosis not present

## 2022-01-17 DIAGNOSIS — R609 Edema, unspecified: Secondary | ICD-10-CM | POA: Diagnosis not present

## 2022-01-17 DIAGNOSIS — R7303 Prediabetes: Secondary | ICD-10-CM | POA: Diagnosis not present

## 2022-01-17 DIAGNOSIS — Z79899 Other long term (current) drug therapy: Secondary | ICD-10-CM | POA: Diagnosis not present

## 2022-01-17 LAB — CBC AND DIFFERENTIAL
HCT: 47 (ref 41–53)
Hemoglobin: 16.1 (ref 13.5–17.5)
Platelets: 257 10*3/uL (ref 150–400)
WBC: 6.8

## 2022-01-17 LAB — BASIC METABOLIC PANEL
BUN: 10 (ref 4–21)
CO2: 21 (ref 13–22)
Chloride: 107 (ref 99–108)
Creatinine: 0.9 (ref 0.6–1.3)
Glucose: 137
Potassium: 4.3 mEq/L (ref 3.5–5.1)
Sodium: 142 (ref 137–147)

## 2022-01-17 LAB — CBC: RBC: 4.8 (ref 3.87–5.11)

## 2022-01-17 LAB — COMPREHENSIVE METABOLIC PANEL: Calcium: 9.3 (ref 8.7–10.7)

## 2022-01-17 LAB — HEMOGLOBIN A1C: Hemoglobin A1C: 6.6

## 2022-01-18 DIAGNOSIS — D225 Melanocytic nevi of trunk: Secondary | ICD-10-CM | POA: Diagnosis not present

## 2022-01-18 DIAGNOSIS — Z85828 Personal history of other malignant neoplasm of skin: Secondary | ICD-10-CM | POA: Diagnosis not present

## 2022-01-18 DIAGNOSIS — L570 Actinic keratosis: Secondary | ICD-10-CM | POA: Diagnosis not present

## 2022-01-19 ENCOUNTER — Encounter: Payer: Self-pay | Admitting: Internal Medicine

## 2022-01-19 DIAGNOSIS — I872 Venous insufficiency (chronic) (peripheral): Secondary | ICD-10-CM | POA: Diagnosis not present

## 2022-01-23 ENCOUNTER — Encounter: Payer: Self-pay | Admitting: Adult Health

## 2022-01-23 ENCOUNTER — Non-Acute Institutional Stay: Payer: Medicare Other | Admitting: Adult Health

## 2022-01-23 VITALS — BP 148/92 | HR 84 | Temp 97.7°F | Resp 18 | Ht 71.0 in | Wt 237.0 lb

## 2022-01-23 DIAGNOSIS — E669 Obesity, unspecified: Secondary | ICD-10-CM | POA: Diagnosis not present

## 2022-01-23 DIAGNOSIS — E1165 Type 2 diabetes mellitus with hyperglycemia: Secondary | ICD-10-CM | POA: Diagnosis not present

## 2022-01-23 DIAGNOSIS — G250 Essential tremor: Secondary | ICD-10-CM

## 2022-01-23 DIAGNOSIS — E7800 Pure hypercholesterolemia, unspecified: Secondary | ICD-10-CM | POA: Diagnosis not present

## 2022-01-23 DIAGNOSIS — I10 Essential (primary) hypertension: Secondary | ICD-10-CM

## 2022-01-23 MED ORDER — METFORMIN HCL ER 500 MG PO TB24: 500.0000 mg | ORAL_TABLET | Freq: Two times a day (BID) | ORAL | 3 refills | Status: DC

## 2022-01-23 NOTE — Progress Notes (Signed)
Location:  [facility]  POS: Clinic  Provider: Royal Hawthorn, ANP  Code Status: DNR Goals of Care:     01/23/2022    1:28 PM  Advanced Directives  Does Patient Have a Medical Advance Directive? Yes  Type of Paramedic of [place];Living will;Out of facility DNR (pink MOST or yellow form)  Does patient want to make changes to medical advance directive? No - Patient declined     Chief Complaint  Patient presents with   Medical Management of Chronic Issues    4 month follow up   Immunizations    Discussed the need for covid vaccine    HPI: Patient is a 76 y.o. male seen today for medical management of chronic diseases.    01/17/22 A1C 6.6, up from 6.4 08/23/21 Started on metformin in May by Dr Lyndel Safe.   Walks a few times a week  Not watching diet well, eating fruit for snacks  NO s/e from metformin   BP 142/90  Wt Readings from Last 3 Encounters:  01/23/22 237 lb (107.5 kg)  08/31/21 238 lb 8 oz (108.2 kg)  02/14/21 239 lb 6.4 oz (108.6 kg)   Had BCC in the past, saw derm and they did cryotherapy last week  Has essential tremor on inderal     Past Medical History:  Diagnosis Date   Allergic rhinitis    Arthritis    Cancer ([place])    basal cell nose   Carotid atherosclerosis, bilateral    dvt  ?14   Cataract    Removal both eyes    DVT (deep venous thrombosis) ([place]) 11/22/2012   GERD (gastroesophageal reflux disease)    occ   High blood pressure    Hypercholesteremia    Hyperlipidemia    Left atrial dilatation    LVH (left ventricular hypertrophy)    Obesity    Seizures ([place])    "in 20's from aspirin"    Past Surgical History:  Procedure Laterality Date   CATARACT EXTRACTION, BILATERAL     COLONOSCOPY  2015   LUMBAR LAMINECTOMY/DECOMPRESSION MICRODISCECTOMY Left 11/30/2016   Procedure: LEFT LUMBAR FOUR-FIVE LAMINECTOMY FOR FACET/SYNOVIAL CYST;  Surgeon: Ashok Pall, MD;  Location: [facility];  Service: Neurosurgery;   Laterality: Left;  LAMINECTOMY FOR FACET/SYNOVIAL CYST LUMBAR 4-LUMBAR 5, LEFT   right leg fracture s/p surgical repair with screws Right 1966   SKIN CANCER EXCISION  2010   from his nose, no recurrence   TOTAL KNEE ARTHROPLASTY Right 2015    Allergies  Allergen Reactions   Asa [Aspirin] Other (See Comments)    Seizures     Outpatient Encounter Medications as of 01/23/2022  Medication Sig   acetaminophen (TYLENOL) 500 MG tablet Take 500 mg by mouth at bedtime as needed for moderate pain.    atorvastatin (LIPITOR) 20 MG tablet TAKE 1 TABLET BY MOUTH ONCE  DAILY   Cholecalciferol (VITAMIN D3) 2000 units TABS Take 1,000 Units by mouth every evening.   influenza vaccine adjuvanted (FLUAD) 0.5 ML injection Inject into the muscle.   losartan (COZAAR) 100 MG tablet TAKE 1 TABLET BY MOUTH DAILY   metFORMIN (GLUCOPHAGE-XR) 500 MG 24 hr tablet Take 1 tablet (500 mg total) by mouth daily with breakfast.   naproxen sodium (ALEVE) 220 MG tablet Take 220 mg by mouth daily as needed (pain).   propranolol (INDERAL) 10 MG tablet Take 1 tablet (10 mg total) by mouth daily as needed (tremor).   No facility-administered encounter medications on file  as of 01/23/2022.    Review of Systems:  Review of Systems  Constitutional:  Negative for activity change, appetite change, chills, diaphoresis, fatigue, fever and unexpected weight change.  HENT:  Negative for congestion.   Eyes:  Negative for visual disturbance.  Respiratory:  Negative for cough, shortness of breath, wheezing and stridor.   Cardiovascular:  Positive for leg swelling (sometimes). Negative for chest pain and palpitations.  Gastrointestinal:  Negative for abdominal distention, abdominal pain, constipation and diarrhea.  Genitourinary:  Negative for difficulty urinating and dysuria.  Musculoskeletal:  Negative for arthralgias, back pain, gait problem, joint swelling and myalgias.  Neurological:  Positive for tremors. Negative for  dizziness, seizures, syncope, facial asymmetry, speech difficulty, weakness and headaches.  Hematological:  Negative for adenopathy. Does not bruise/bleed easily.  Psychiatric/Behavioral:  Negative for agitation, behavioral problems and confusion.     Health Maintenance  Topic Date Due   COVID-19 Vaccine (5 - Moderna series) 10/28/2020   TETANUS/TDAP  04/04/2023   COLONOSCOPY (Pts 45-3yr Insurance coverage will need to be confirmed)  05/27/2024   Pneumonia Vaccine 76 Years old  Completed   INFLUENZA VACCINE  Completed   Hepatitis C Screening  Completed   Zoster Vaccines- Shingrix  Completed   HPV VACCINES  Aged Out    Physical Exam: Vitals:   01/23/22 1325  BP: (!) 142/90  Pulse: 84  Resp: 18  Temp: 97.7 F (36.5 C)  TempSrc: Temporal  SpO2: 95%  Weight: 237 lb (107.5 kg)  Height: '5\' 11"'$  (1.803 m)   Body mass index is 33.05 kg/m. Physical Exam Vitals reviewed.  Constitutional:      General: He is not in acute distress.    Appearance: He is not diaphoretic.  HENT:     Head: Normocephalic and atraumatic.     Right Ear: Tympanic membrane normal.     Left Ear: Tympanic membrane normal.     Nose: Nose normal.     Mouth/Throat:     Mouth: Mucous membranes are moist.     Pharynx: Oropharynx is clear.  Neck:     Thyroid: No thyromegaly.     Vascular: No JVD.     Trachea: No tracheal deviation.  Cardiovascular:     Rate and Rhythm: Normal rate and regular rhythm.     Heart sounds: No murmur heard. Pulmonary:     Effort: Pulmonary effort is normal. No respiratory distress.     Breath sounds: Normal breath sounds. No wheezing.  Abdominal:     General: Bowel sounds are normal. There is no distension.     Palpations: Abdomen is soft.     Tenderness: There is no abdominal tenderness.  Musculoskeletal:     Right lower leg: No edema.     Left lower leg: No edema.  Lymphadenopathy:     Cervical: No cervical adenopathy.  Skin:    General: Skin is warm and dry.   Neurological:     Mental Status: He is alert and oriented to person, place, and time.     Cranial Nerves: No cranial nerve deficit.  Psychiatric:        Mood and Affect: Mood normal.     Labs reviewed: Basic Metabolic Panel: Recent Labs    02/10/21 0000 08/23/21 0000 01/17/22 0000  NA 141 143 142  K 4.3 4.6 4.3  CL 107 105 107  CO2 '20 22 21  '$ BUN '13 9 10  '$ CREATININE 1.0 1.0 0.9  CALCIUM 9.2 9.6 9.3  TSH  --  2.71  --    Liver Function Tests: Recent Labs    02/10/21 0000  AST 27  ALT 41*  ALKPHOS 79  ALBUMIN 4.4   No results for input(s): "LIPASE", "AMYLASE" in the last 8760 hours. No results for input(s): "AMMONIA" in the last 8760 hours. CBC: Recent Labs    02/10/21 0000 01/17/22 0000  WBC 7.2 6.8  HGB 16.1 16.1  HCT 46 47  PLT 310 257   Lipid Panel: Recent Labs    02/10/21 0000 08/23/21 0000  CHOL 152 142  HDL 34* 34*  LDLCALC 91 88  TRIG 135 105   Lab Results  Component Value Date   HGBA1C 6.4 08/23/2021    Procedures since last visit: No results found.  Assessment/Plan  1. Type 2 diabetes mellitus with hyperglycemia, without long-term current use of insulin (HCC) Increase metformin to 500 mg bid Try to get 30 min of walking in 5 days a week Reduce sugar and carbs.  F/U 4 months with A1C draw   2. Essential hypertension Bp elevated, due to DM would like to keep <140/90 This is the first elevated reading and has prior hx of hypotension. Will have him check a home and with clinic nurse.  If 2 consistently elevated readings report to me for further management.   3. Benign essential tremor On inderal   4. Pure hypercholesterolemia Lab Results  Component Value Date   CHOL 142 08/23/2021   HDL 34 (A) 08/23/2021   LDLCALC 88 08/23/2021   TRIG 105 08/23/2021   On Lipitor   5. Obesity (BMI 30.0-34.9) Encourage activity and diet changes as above    Recommend covid vaccine     Labs/tests ordered:  * No order type specified *  A1C Next appt:   f/u 4 months with me    Total time 98mn:  time greater than 50% of total time spent doing pt counseling and coordination of care

## 2022-01-23 NOTE — Patient Instructions (Addendum)
Recommend covid vaccine  Try to get 30 min of walking in 5 days a week  Reduce sugar and carbs.   F/U 4 months with A1C draw   Check BP at home or with the clinic nurse, goal BP <140/90. If 2 elevated readings let us know.

## 2022-02-01 DIAGNOSIS — Z23 Encounter for immunization: Secondary | ICD-10-CM | POA: Diagnosis not present

## 2022-02-15 DIAGNOSIS — L905 Scar conditions and fibrosis of skin: Secondary | ICD-10-CM | POA: Diagnosis not present

## 2022-02-15 DIAGNOSIS — L579 Skin changes due to chronic exposure to nonionizing radiation, unspecified: Secondary | ICD-10-CM | POA: Diagnosis not present

## 2022-02-15 DIAGNOSIS — L814 Other melanin hyperpigmentation: Secondary | ICD-10-CM | POA: Diagnosis not present

## 2022-02-15 DIAGNOSIS — L570 Actinic keratosis: Secondary | ICD-10-CM | POA: Diagnosis not present

## 2022-02-15 DIAGNOSIS — L732 Hidradenitis suppurativa: Secondary | ICD-10-CM | POA: Diagnosis not present

## 2022-02-15 DIAGNOSIS — D492 Neoplasm of unspecified behavior of bone, soft tissue, and skin: Secondary | ICD-10-CM | POA: Diagnosis not present

## 2022-02-28 ENCOUNTER — Ambulatory Visit (INDEPENDENT_AMBULATORY_CARE_PROVIDER_SITE_OTHER): Payer: Medicare Other | Admitting: Nurse Practitioner

## 2022-02-28 ENCOUNTER — Encounter: Payer: Medicare Other | Admitting: Nurse Practitioner

## 2022-02-28 ENCOUNTER — Encounter: Payer: Self-pay | Admitting: Nurse Practitioner

## 2022-02-28 ENCOUNTER — Telehealth: Payer: Self-pay

## 2022-02-28 DIAGNOSIS — Z0000 Encounter for general adult medical examination without abnormal findings: Secondary | ICD-10-CM

## 2022-02-28 NOTE — Progress Notes (Signed)
   This service is provided via telemedicine  No vital signs collected/recorded due to the encounter was a telemedicine visit.   Location of patient (ex: home, work):  Home  Patient consents to a telephone visit: Yes, see telephone visit dated 02/28/22  Location of the provider (ex: office, home):  [facility], Office   Name of any referring provider:  N/A  Names of all persons participating in the telemedicine service and their role in the encounter:  S.Chrae B/CMA, Sherrie Mustache, NP, and Patient   Time spent on call:  9 min with medical assistant

## 2022-02-28 NOTE — Progress Notes (Signed)
Subjective:   Colton Hart is a 76 y.o. male who presents for Medicare Annual/Subsequent preventive examination.  Review of Systems     Cardiac Risk Factors include: advanced age (>39mn, >>61women);male gender;dyslipidemia;hypertension;diabetes mellitus;sedentary lifestyle;obesity (BMI >30kg/m2)     Objective:    There were no vitals filed for this visit. There is no height or weight on file to calculate BMI.     02/28/2022    8:22 AM 01/23/2022    1:28 PM 08/31/2021    8:45 AM 02/22/2021    9:22 AM 04/21/2020    8:59 AM 02/19/2020    8:39 AM 11/26/2019    8:30 AM  Advanced Directives  Does Patient Have a Medical Advance Directive? Yes Yes Yes Yes Yes Yes Yes  Type of [place]amed[place] facility DNR (pink MOST or yellow form) [place]Living will;Out of facility DNR (pink MOST or yellow form) Out of facility DNR (pink MOST or yellow form) Out of facility DNR (pink MOST or yellow form) Out of facility DNR (pink MOST or yellow form) Out of facility DNR (pink MOST or yellow form) Out of facility DNR (pink MOST or yellow form)  Does patient want to make changes to medical advance directive? No - Patient declined No - Patient declined No - Patient declined No - Patient declined No - Patient declined No - Patient declined No - Guardian declined  Copy of [place]in Chart? Yes - validated most recent copy scanned in chart (See row information)        Pre-existing out of facility DNR order (yellow form or pink MOST form) Yellow form placed in chart (order not valid for inpatient use);Pink MOST form placed in chart (order not valid for inpatient use)  Pink MOST form placed in chart (order not valid for inpatient use) Yellow form placed in chart (order not valid for inpatient use);Pink MOST form placed in chart (order not valid for inpatient use) Pink MOST/Yellow Form most recent copy in chart - Physician notified to  receive inpatient order  Pink MOST/Yellow Form most recent copy in chart - Physician notified to receive inpatient order    Current Medications (verified) Outpatient Encounter Medications as of 02/28/2022  Medication Sig   acetaminophen (TYLENOL) 500 MG tablet Take 500 mg by mouth at bedtime as needed for moderate pain.    atorvastatin (LIPITOR) 20 MG tablet TAKE 1 TABLET BY MOUTH ONCE  DAILY   Cholecalciferol (VITAMIN D3) 2000 units TABS Take 1,000 Units by mouth every evening.   losartan (COZAAR) 100 MG tablet TAKE 1 TABLET BY MOUTH DAILY   metFORMIN (GLUCOPHAGE-XR) 500 MG 24 hr tablet Take 1 tablet (500 mg total) by mouth 2 (two) times daily with a meal.   naproxen sodium (ALEVE) 220 MG tablet Take 220 mg by mouth daily as needed (pain).   propranolol (INDERAL) 10 MG tablet Take 1 tablet (10 mg total) by mouth daily as needed (tremor).   [DISCONTINUED] influenza vaccine adjuvanted (FLUAD) 0.5 ML injection Inject into the muscle.   No facility-administered encounter medications on file as of 02/28/2022.    Allergies (verified) Asa [aspirin]   History: Past Medical History:  Diagnosis Date   Allergic rhinitis    Arthritis    Cancer ([place]    basal cell nose   Carotid atherosclerosis, bilateral    dvt  ?14   Cataract    Removal both eyes    DVT (deep venous thrombosis) (  [place]) 11/22/2012   GERD (gastroesophageal reflux disease)    occ   High blood pressure    Hypercholesteremia    Hyperlipidemia    Left atrial dilatation    LVH (left ventricular hypertrophy)    Obesity    Seizures ([place])    "in 20's from aspirin"   Past Surgical History:  Procedure Laterality Date   CATARACT EXTRACTION, BILATERAL     COLONOSCOPY  2015   LUMBAR LAMINECTOMY/DECOMPRESSION MICRODISCECTOMY Left 11/30/2016   Procedure: LEFT LUMBAR FOUR-FIVE LAMINECTOMY FOR FACET/SYNOVIAL CYST;  Surgeon: Ashok Pall, MD;  Location: [facility];  Service: Neurosurgery;  Laterality: Left;  LAMINECTOMY FOR  FACET/SYNOVIAL CYST LUMBAR 4-LUMBAR 5, LEFT   right leg fracture s/p surgical repair with screws Right 1966   SKIN CANCER EXCISION  2010   from his nose, no recurrence   TOTAL KNEE ARTHROPLASTY Right 2015   Family History  Problem Relation Age of Onset   Alzheimer's disease Mother    Cancer Mother        Skin   Alzheimer's disease Father    Cancer Father        colon, liver, skin   Colon polyps Father    Colon cancer Father    Esophageal cancer Neg Hx    Rectal cancer Neg Hx    Stomach cancer Neg Hx    Social History   Socioeconomic History   Marital status: Married    Spouse name: Not on file   Number of children: Not on file   Years of education: Not on file   Highest education level: Not on file  Occupational History   Not on file  Tobacco Use   Smoking status: Former    Packs/day: 1.00    Years: 20.00    Total pack years: 20.00    Types: Cigarettes    Quit date: 11/27/1988    Years since quitting: 33.2   Smokeless tobacco: Never  Vaping Use   Vaping Use: Never used  Substance and Sexual Activity   Alcohol use: No   Drug use: No   Sexual activity: Not on file  Other Topics Concern   Not on file  Social History Narrative   Diet?        Do you drink/eat things with caffeine? Yes      Marital status?            Married                        What year were you married? 2001      Do you live in a house, apartment, assisted living, condo, trailer, etc.? home      Is it one or more stories? no      How many persons live in your home? 2      Do you have any pets in your home? (please list) no      Current or past profession: IT consultant      Do you exercise?      No                                Type & how often? Walk 1-2 miles, 1-2 times per week      Do you have a living will? No      Do you have a DNR form?    No  If not, do you want to discuss one? Yes      Do you have signed POA/HPOA for forms?  Yes   Social  Determinants of Health   Financial Resource Strain: Low Risk  (02/05/2018)   Overall Financial Resource Strain (CARDIA)    Difficulty of Paying Living Expenses: Not hard at all  Food Insecurity: No Food Insecurity (02/05/2018)   Hunger Vital Sign    Worried About Running Out of Food in the Last Year: Never true    Ran Out of Food in the Last Year: Never true  Transportation Needs: No Transportation Needs (02/05/2018)   PRAPARE - Hydrologist (Medical): No    Lack of Transportation (Non-Medical): No  Physical Activity: Sufficiently Active (02/05/2018)   Exercise Vital Sign    Days of Exercise per Week: 7 days    Minutes of Exercise per Session: 30 min  Stress: No Stress Concern Present (02/05/2018)   [place]    Feeling of Stress : Only a little  Social Connections: Somewhat Isolated (02/05/2018)   Social Connection and Isolation Panel [NHANES]    Frequency of Communication with Friends and Family: More than three times a week    Frequency of Social Gatherings with Friends and Family: More than three times a week    Attends Religious Services: Never    Marine scientist or Organizations: No    Attends Music therapist: Never    Marital Status: Married    Tobacco Counseling Counseling given: Not Answered   Clinical Intake:  Pre-visit preparation completed: Yes  Pain : No/denies pain     BMI - recorded: 33 Nutritional Status: BMI > 30  Obese Diabetes: Yes  How often do you need to have someone help you when you read instructions, pamphlets, or other written materials from your doctor or pharmacy?: 1 - Never  Diabetic?yes          Activities of Daily Living    02/28/2022    8:42 AM  In your present state of health, do you have any difficulty performing the following activities:  Hearing? 1  Vision? 0  Difficulty concentrating or making decisions? 0   Walking or climbing stairs? 0  Dressing or bathing? 0  Doing errands, shopping? 0  Preparing Food and eating ? N  Using the Toilet? N  In the past six months, have you accidently leaked urine? N  Do you have problems with loss of bowel control? N  Managing your Medications? N  Managing your Finances? N  Housekeeping or managing your Housekeeping? N    Patient Care Team: Virgie Dad, MD as PCP - General (Internal Medicine)  Indicate any recent Medical Services you may have received from other than [facility] providers in the past year (date may be approximate).     Assessment:   This is a routine wellness examination for Colton Hart.  Hearing/Vision screen Hearing Screening - Comments:: Plans to get hearing aids in the near future. Mostly hears ok  Vision Screening - Comments:: Last eye exam less than 12 months ago Dr Ellie Lunch   Dietary issues and exercise activities discussed:     Goals Addressed   None   Depression Screen    02/28/2022    8:20 AM 01/23/2022    1:28 PM 08/31/2021    8:45 AM 02/22/2021    9:21 AM 04/21/2020    8:59 AM 02/19/2020  8:36 AM 07/23/2019    8:31 AM  PHQ 2/9 Scores  PHQ - 2 Score 0 0 0 0 0 0 0    Fall Risk    02/28/2022    8:20 AM 01/23/2022    1:28 PM 08/31/2021    8:44 AM 02/22/2021    9:21 AM 08/18/2020    9:05 AM  Fall Risk   Falls in the past year? 0 0 0 0 0  Number falls in past yr: 0 0 0 0 0  Injury with Fall? 0 0 0 0   Risk for fall due to : No Fall Risks No Fall Risks No Fall Risks No Fall Risks   Follow up  Falls evaluation completed Falls evaluation completed Falls evaluation completed     [place]:  Any stairs in or around the home? No  If so, are there any without handrails? No  Home free of loose throw rugs in walkways, pet beds, electrical cords, etc? Yes  Adequate lighting in your home to reduce risk of falls? Yes   ASSISTIVE DEVICES UTILIZED TO PREVENT FALLS:  Life alert? No  Use  of a cane, walker or w/c? No  Grab bars in the bathroom? Yes  Shower chair or bench in shower? No  Elevated toilet seat or a handicapped toilet? No   TIMED UP AND GO:  Was the test performed? No .   Cognitive Function:    02/05/2018    8:47 AM 01/09/2017   10:22 AM  MMSE - Mini Mental State Exam  Orientation to time 5 5  Orientation to Place 5 5  Registration 3 3  Attention/ Calculation 5 5  Recall 3 3  Language- name 2 objects 2 2  Language- repeat 1 1  Language- follow 3 step command 3 3  Language- read & follow direction 1 1  Write a sentence 1 1  Copy design 1 1  Total score 30 30        02/28/2022    8:22 AM 02/22/2021    9:22 AM 02/19/2020    8:39 AM 01/23/2019    1:16 PM  6CIT Screen  What Year? 0 points 0 points 0 points 0 points  What month? 0 points 0 points 0 points 0 points  What time? 0 points 0 points 0 points 0 points  Count back from 20 0 points 0 points 0 points 0 points  Months in reverse 0 points 0 points 0 points 0 points  Repeat phrase 0 points 0 points 0 points 2 points  Total Score 0 points 0 points 0 points 2 points    Immunizations Immunization History  Administered Date(s) Administered   Covid-19, Mrna,Vaccine(Spikevax)22yr and older 02/01/2022   Fluad Quad(high Dose 65+) 01/03/2022   Hep A / Hep B 04/03/2002   Influenza, High Dose Seasonal PF 01/09/2014, 01/07/2015, 01/09/2018, 01/10/2019, 01/30/2020   Influenza-Unspecified 02/18/2016, 01/22/2017, 01/21/2021   Moderna SARS-COV2 Booster Vaccination 09/02/2020   Moderna Sars-Covid-2 Vaccination 04/15/2019, 05/14/2019, 02/17/2020, 09/02/2020   Pneumococcal Conjugate,unspecified 09/30/2013   Pneumococcal Conjugate-13 09/19/2017   Pneumococcal Polysaccharide-23 09/30/2013   Pneumococcal-Unspecified 09/30/2013, 01/02/2015   Tdap 04/03/2013   Zoster Recombinat (Shingrix) 08/18/2017, 10/20/2017, 11/29/2017    TDAP status: Up to date  Flu Vaccine status: Up to date  Pneumococcal  vaccine status: Up to date  Covid-19 vaccine status: Information provided on how to obtain vaccines.   Qualifies for Shingles Vaccine? Yes   Zostavax completed No   Shingrix  Completed?: Yes  Screening Tests Health Maintenance  Topic Date Due   Diabetic kidney evaluation - Urine ACR  Never done   COVID-19 Vaccine (6 - 2023-24 season) 12/02/2021   Diabetic kidney evaluation - GFR measurement  01/18/2023   Medicare Annual Wellness (AWV)  03/01/2023   COLONOSCOPY (Pts 45-66yr Insurance coverage will need to be confirmed)  05/27/2024   Pneumonia Vaccine 76 Years old  Completed   INFLUENZA VACCINE  Completed   Hepatitis C Screening  Completed   Zoster Vaccines- Shingrix  Completed   HPV VACCINES  Aged Out    Health Maintenance  Health Maintenance Due  Topic Date Due   Diabetic kidney evaluation - Urine ACR  Never done   COVID-19 Vaccine (6 - 2023-24 season) 12/02/2021    Colorectal cancer screening: Type of screening: Colonoscopy. Completed 2021. Repeat every 5 years  Lung Cancer Screening: (Low Dose CT Chest recommended if Age 74-80years, 30 pack-year currently smoking OR have quit w/in 15years.) does not qualify.    Additional Screening:  Hepatitis C Screening: does qualify; Completed 2022  Vision Screening: Recommended annual ophthalmology exams for early detection of glaucoma and other disorders of the eye. Is the patient up to date with their annual eye exam?  Yes  Who is the provider or what is the name of the office in which the patient attends annual eye exams? [facility] If pt is not established with a provider, would they like to be referred to a provider to establish care? Yes .   Dental Screening: Recommended annual dental exams for proper oral hygiene  Community Resource Referral / Chronic Care Management: CRR required this visit?  No   CCM required this visit?  No      Plan:     I have personally reviewed and noted the following in the patient's chart:    Medical and social history Use of alcohol, tobacco or illicit drugs  Current medications and supplements including opioid prescriptions. Patient is not currently taking opioid prescriptions. Functional ability and status Nutritional status Physical activity Advanced directives List of other physicians Hospitalizations, surgeries, and ER visits in previous 12 months Vitals Screenings to include cognitive, depression, and falls Referrals and appointments  In addition, I have reviewed and discussed with patient certain preventive protocols, quality metrics, and best practice recommendations. A written personalized care plan for preventive services as well as general preventive health recommendations were provided to patient.     JLauree Chandler NP   02/28/2022    Virtual Visit via Telephone Note  I connected with patient 02/28/22 at  8:20 AM EST by telephone and verified that I am speaking with the correct person using two identifiers.  Location: Patient: home Provider: [facility]   I discussed the limitations, risks, security and privacy concerns of performing an evaluation and management service by telephone and the availability of in person appointments. I also discussed with the patient that there may be a patient responsible charge related to this service. The patient expressed understanding and agreed to proceed.   I discussed the assessment and treatment plan with the patient. The patient was provided an opportunity to ask questions and all were answered. The patient agreed with the plan and demonstrated an understanding of the instructions.   The patient was advised to call back or seek an in-person evaluation if the symptoms worsen or if the condition fails to improve as anticipated.  I provided 14 minutes of non-face-to-face time during this encounter.  Carlos American. Harle Battiest Avs printed and mailed

## 2022-02-28 NOTE — Patient Instructions (Signed)
Colton Hart , Thank you for taking time to come for your Medicare Wellness Visit. I appreciate your ongoing commitment to your health goals. Please review the following plan we discussed and let me know if I can assist you in the future.   Screening recommendations/referrals: Colonoscopy up to date Recommended yearly ophthalmology/optometry visit for glaucoma screening and checkup Recommended yearly dental visit for hygiene and checkup  Vaccinations: Influenza vaccine due annually in September/October Pneumococcal vaccine up to date Tdap vaccine up to date Shingles vaccine up to date    Advanced directives: on file.   Conditions/risks identified: advance age, obesity, ,   Next appointment: yearly- next year in the [facility]  Preventive Care 21 Years and Older, Male Preventive care refers to lifestyle choices and visits with your health care provider that can promote health and wellness. What does preventive care include? A yearly physical exam. This is also called an annual well check. Dental exams once or twice a year. Routine eye exams. Ask your health care provider how often you should have your eyes checked. Personal lifestyle choices, including: Daily care of your teeth and gums. Regular physical activity. Eating a healthy diet. Avoiding tobacco and drug use. Limiting alcohol use. Practicing safe sex. Taking low doses of aspirin every day. Taking vitamin and mineral supplements as recommended by your health care provider. What happens during an annual well check? The services and screenings done by your health care provider during your annual well check will depend on your age, overall health, lifestyle risk factors, and family history of disease. Counseling  Your health care provider may ask you questions about your: Alcohol use. Tobacco use. Drug use. Emotional well-being. Home and relationship well-being. Sexual activity. Eating habits. History of  falls. Memory and ability to understand (cognition). Work and work Statistician. Screening  You may have the following tests or measurements: Height, weight, and BMI. Blood pressure. Lipid and cholesterol levels. These may be checked every 5 years, or more frequently if you are over 27 years old. Skin check. Lung cancer screening. You may have this screening every year starting at age 70 if you have a 30-pack-year history of smoking and currently smoke or have quit within the past 15 years. Fecal occult blood test (FOBT) of the stool. You may have this test every year starting at age 62. Flexible sigmoidoscopy or colonoscopy. You may have a sigmoidoscopy every 5 years or a colonoscopy every 10 years starting at age 62. Prostate cancer screening. Recommendations will vary depending on your family history and other risks. Hepatitis C blood test. Hepatitis B blood test. Sexually transmitted disease (STD) testing. Diabetes screening. This is done by checking your blood sugar (glucose) after you have not eaten for a while (fasting). You may have this done every 1-3 years. Abdominal aortic aneurysm (AAA) screening. You may need this if you are a current or former smoker. Osteoporosis. You may be screened starting at age 1 if you are at high risk. Talk with your health care provider about your test results, treatment options, and if necessary, the need for more tests. Vaccines  Your health care provider may recommend certain vaccines, such as: Influenza vaccine. This is recommended every year. Tetanus, diphtheria, and acellular pertussis (Tdap, Td) vaccine. You may need a Td booster every 10 years. Zoster vaccine. You may need this after age 12. Pneumococcal 13-valent conjugate (PCV13) vaccine. One dose is recommended after age 63. Pneumococcal polysaccharide (PPSV23) vaccine. One dose is recommended after age 9. Talk  to your health care provider about which screenings and vaccines you need and  how often you need them. This information is not intended to replace advice given to you by your health care provider. Make sure you discuss any questions you have with your health care provider. Document Released: 04/16/2015 Document Revised: 12/08/2015 Document Reviewed: 01/19/2015 Elsevier Interactive Patient Education  2017 [employer] Prevention in the Home Falls can cause injuries. They can happen to people of all ages. There are many things you can do to make your home safe and to help prevent falls. What can I do on the outside of my home? Regularly fix the edges of walkways and driveways and fix any cracks. Remove anything that might make you trip as you walk through a door, such as a raised step or threshold. Trim any bushes or trees on the path to your home. Use bright outdoor lighting. Clear any walking paths of anything that might make someone trip, such as rocks or tools. Regularly check to see if handrails are loose or broken. Make sure that both sides of any steps have handrails. Any raised decks and porches should have guardrails on the edges. Have any leaves, snow, or ice cleared regularly. Use sand or salt on walking paths during winter. Clean up any spills in your garage right away. This includes oil or grease spills. What can I do in the bathroom? Use night lights. Install grab bars by the toilet and in the tub and shower. Do not use towel bars as grab bars. Use non-skid mats or decals in the tub or shower. If you need to sit down in the shower, use a plastic, non-slip stool. Keep the floor dry. Clean up any water that spills on the floor as soon as it happens. Remove soap buildup in the tub or shower regularly. Attach bath mats securely with double-sided non-slip rug tape. Do not have throw rugs and other things on the floor that can make you trip. What can I do in the bedroom? Use night lights. Make sure that you have a light by your bed that is easy to  reach. Do not use any sheets or blankets that are too big for your bed. They should not hang down onto the floor. Have a firm chair that has side arms. You can use this for support while you get dressed. Do not have throw rugs and other things on the floor that can make you trip. What can I do in the kitchen? Clean up any spills right away. Avoid walking on wet floors. Keep items that you use a lot in easy-to-reach places. If you need to reach something above you, use a strong step stool that has a grab bar. Keep electrical cords out of the way. Do not use floor polish or wax that makes floors slippery. If you must use wax, use non-skid floor wax. Do not have throw rugs and other things on the floor that can make you trip. What can I do with my stairs? Do not leave any items on the stairs. Make sure that there are handrails on both sides of the stairs and use them. Fix handrails that are broken or loose. Make sure that handrails are as long as the stairways. Check any carpeting to make sure that it is firmly attached to the stairs. Fix any carpet that is loose or worn. Avoid having throw rugs at the top or bottom of the stairs. If you do have throw rugs,  attach them to the floor with carpet tape. Make sure that you have a light switch at the top of the stairs and the bottom of the stairs. If you do not have them, ask someone to add them for you. What else can I do to help prevent falls? Wear shoes that: Do not have high heels. Have rubber bottoms. Are comfortable and fit you well. Are closed at the toe. Do not wear sandals. If you use a stepladder: Make sure that it is fully opened. Do not climb a closed stepladder. Make sure that both sides of the stepladder are locked into place. Ask someone to hold it for you, if possible. Clearly mark and make sure that you can see: Any grab bars or handrails. First and last steps. Where the edge of each step is. Use tools that help you move  around (mobility aids) if they are needed. These include: Canes. Walkers. Scooters. Crutches. Turn on the lights when you go into a dark area. Replace any light bulbs as soon as they burn out. Set up your furniture so you have a clear path. Avoid moving your furniture around. If any of your floors are uneven, fix them. If there are any pets around you, be aware of where they are. Review your medicines with your doctor. Some medicines can make you feel dizzy. This can increase your chance of falling. Ask your doctor what other things that you can do to help prevent falls. This information is not intended to replace advice given to you by your health care provider. Make sure you discuss any questions you have with your health care provider. Document Released: 01/14/2009 Document Revised: 08/26/2015 Document Reviewed: 04/24/2014 Elsevier Interactive Patient Education  2017 [employer].

## 2022-02-28 NOTE — Telephone Encounter (Signed)
Mr. Colton Hart, Colton Hart are scheduled for a virtual visit with your provider today.    Just as we do with appointments in the office, we must obtain your consent to participate.  Your consent will be active for this visit and any virtual visit you may have with one of our providers in the next 365 days.    If you have a MyChart account, I can also send a copy of this consent to you electronically.  All virtual visits are billed to your insurance company just like a traditional visit in the office.  As this is a virtual visit, video technology does not allow for your provider to perform a traditional examination.  This may limit your provider's ability to fully assess your condition.  If your provider identifies any concerns that need to be evaluated in person or the need to arrange testing such as labs, EKG, etc, we will make arrangements to do so.    Although advances in technology are sophisticated, we cannot ensure that it will always work on either your end or our end.  If the connection with a video visit is poor, we may have to switch to a telephone visit.  With either a video or telephone visit, we are not always able to ensure that we have a secure connection.   I need to obtain your verbal consent now.   Are you willing to proceed with your visit today?   Colton Hart has provided verbal consent on 02/28/2022 for a virtual visit (video or telephone).   Leigh Aurora Haena, [place] 02/28/2022  8:47 AM

## 2022-03-22 DIAGNOSIS — L719 Rosacea, unspecified: Secondary | ICD-10-CM | POA: Diagnosis not present

## 2022-03-22 DIAGNOSIS — L821 Other seborrheic keratosis: Secondary | ICD-10-CM | POA: Diagnosis not present

## 2022-03-22 DIAGNOSIS — Z85828 Personal history of other malignant neoplasm of skin: Secondary | ICD-10-CM | POA: Diagnosis not present

## 2022-03-22 DIAGNOSIS — L905 Scar conditions and fibrosis of skin: Secondary | ICD-10-CM | POA: Diagnosis not present

## 2022-03-27 ENCOUNTER — Other Ambulatory Visit: Payer: Self-pay | Admitting: Internal Medicine

## 2022-03-27 DIAGNOSIS — E7800 Pure hypercholesterolemia, unspecified: Secondary | ICD-10-CM

## 2022-04-23 ENCOUNTER — Encounter: Payer: Self-pay | Admitting: Internal Medicine

## 2022-04-23 DIAGNOSIS — E7800 Pure hypercholesterolemia, unspecified: Secondary | ICD-10-CM

## 2022-04-23 DIAGNOSIS — I10 Essential (primary) hypertension: Secondary | ICD-10-CM

## 2022-04-24 MED ORDER — METFORMIN HCL ER 500 MG PO TB24: 500.0000 mg | ORAL_TABLET | Freq: Two times a day (BID) | ORAL | 1 refills | Status: DC

## 2022-04-24 MED ORDER — ATORVASTATIN CALCIUM 20 MG PO TABS: 20.0000 mg | ORAL_TABLET | Freq: Every day | ORAL | 1 refills | Status: DC

## 2022-04-24 MED ORDER — LOSARTAN POTASSIUM 100 MG PO TABS: 100.0000 mg | ORAL_TABLET | Freq: Every day | ORAL | 1 refills | Status: DC

## 2022-05-29 ENCOUNTER — Encounter: Payer: Self-pay | Admitting: Adult Health

## 2022-05-29 ENCOUNTER — Non-Acute Institutional Stay: Payer: Medicare Other | Admitting: Adult Health

## 2022-05-29 VITALS — BP 134/80 | HR 76 | Temp 97.8°F | Resp 17 | Ht 71.0 in | Wt 229.0 lb

## 2022-05-29 DIAGNOSIS — E1165 Type 2 diabetes mellitus with hyperglycemia: Secondary | ICD-10-CM

## 2022-05-29 DIAGNOSIS — E669 Obesity, unspecified: Secondary | ICD-10-CM | POA: Diagnosis not present

## 2022-05-29 DIAGNOSIS — E7800 Pure hypercholesterolemia, unspecified: Secondary | ICD-10-CM | POA: Diagnosis not present

## 2022-05-29 DIAGNOSIS — G250 Essential tremor: Secondary | ICD-10-CM

## 2022-05-29 DIAGNOSIS — I10 Essential (primary) hypertension: Secondary | ICD-10-CM

## 2022-05-29 NOTE — Patient Instructions (Signed)
Come in the am 2/27 fasting for blood work.

## 2022-05-29 NOTE — Progress Notes (Signed)
Location:  [facility]  POS: Clinic  Provider: Royal Hawthorn, ANP  Code Status: DNR Goals of Care:     02/28/2022    8:22 AM  Advanced Directives  Does Patient Have a Medical Advance Directive? Yes  Type of Paramedic of [place];Out of facility DNR (pink MOST or yellow form)  Does patient want to make changes to medical advance directive? No - Patient declined  Copy of [place] in Chart? Yes - validated most recent copy scanned in chart (See row information)  Pre-existing out of facility DNR order (yellow form or pink MOST form) Yellow form placed in chart (order not valid for inpatient use);Pink MOST form placed in chart (order not valid for inpatient use)     Chief Complaint  Patient presents with   Medical Management of Chronic Issues    4 Month follow up   Quality Metric Gaps    Diabetic Kidney Evaluation needed.    HPI: Patient is a 77 y.o. male seen today for medical management of chronic diseases.    Married with 2 children lives in [place]  DM II: Metformin increased in Oct of 2023 due to elevated blood sugars.  Has some loss of sensation in feet, no pain Doesn't wear shoes sometimes due to dermatitis  Essential tremor: Takes propranolol prn for writing. Scheduled dosing made his mouth dry  HLD on lipitor Lab Results  Component Value Date   LDLCALC 88 08/23/2021     Exercise: walk 30-45 min a few times a week   Diet: eats healthy with high volume. Losing weight Wt Readings from Last 3 Encounters:  05/29/22 229 lb (103.9 kg)  01/23/22 237 lb (107.5 kg)  08/31/21 238 lb 8 oz (108.2 kg)     Hx of BCC with cryotherapy, DVT, seizure when taking asa, DVT, carotid atherosclerosis, obesity, LVH  Has had enlarged prostate, no current issues. Did not tolerate meds in the past Past Medical History:  Diagnosis Date   Allergic rhinitis    Arthritis    Cancer ([place])    basal cell nose   Carotid atherosclerosis,  bilateral    dvt  ?14   Cataract    Removal both eyes    DVT (deep venous thrombosis) ([place]) 11/22/2012   GERD (gastroesophageal reflux disease)    occ   High blood pressure    Hypercholesteremia    Hyperlipidemia    Left atrial dilatation    LVH (left ventricular hypertrophy)    Obesity    Seizures ([place])    "in 20's from aspirin"    Past Surgical History:  Procedure Laterality Date   CATARACT EXTRACTION, BILATERAL     COLONOSCOPY  2015   LUMBAR LAMINECTOMY/DECOMPRESSION MICRODISCECTOMY Left 11/30/2016   Procedure: LEFT LUMBAR FOUR-FIVE LAMINECTOMY FOR FACET/SYNOVIAL CYST;  Surgeon: Ashok Pall, MD;  Location: [facility];  Service: Neurosurgery;  Laterality: Left;  LAMINECTOMY FOR FACET/SYNOVIAL CYST LUMBAR 4-LUMBAR 5, LEFT   right leg fracture s/p surgical repair with screws Right 1966   SKIN CANCER EXCISION  2010   from his nose, no recurrence   TOTAL KNEE ARTHROPLASTY Right 2015    Allergies  Allergen Reactions   Asa [Aspirin] Other (See Comments)    Seizures     Outpatient Encounter Medications as of 05/29/2022  Medication Sig   acetaminophen (TYLENOL) 500 MG tablet Take 500 mg by mouth at bedtime as needed for moderate pain.    atorvastatin (LIPITOR) 20 MG tablet Take 1 tablet (  20 mg total) by mouth daily.   Cholecalciferol (VITAMIN D3) 2000 units TABS Take 1,000 Units by mouth every evening.   losartan (COZAAR) 100 MG tablet Take 1 tablet (100 mg total) by mouth daily.   metFORMIN (GLUCOPHAGE-XR) 500 MG 24 hr tablet Take 1 tablet (500 mg total) by mouth 2 (two) times daily with a meal.   naproxen sodium (ALEVE) 220 MG tablet Take 220 mg by mouth daily as needed (pain).   propranolol (INDERAL) 10 MG tablet Take 1 tablet (10 mg total) by mouth daily as needed (tremor).   No facility-administered encounter medications on file as of 05/29/2022.    Review of Systems:  Review of Systems  Constitutional:  Negative for chills, diaphoresis and fever.  Respiratory:  Negative  for cough and wheezing.   Cardiovascular:  Negative for chest pain and leg swelling.  Gastrointestinal:  Negative for abdominal pain, constipation, diarrhea, nausea and vomiting.  Genitourinary:  Negative for dysuria and urgency.  Musculoskeletal:  Negative for back pain, myalgias and neck pain.  Skin:  Negative for rash.  Neurological:  Positive for numbness (feet). Negative for weakness.    Health Maintenance  Topic Date Due   Diabetic kidney evaluation - Urine ACR  Never done   Diabetic kidney evaluation - eGFR measurement  01/18/2023   Medicare Annual Wellness (AWV)  03/01/2023   DTaP/Tdap/Td (2 - Td or Tdap) 04/04/2023   COLONOSCOPY (Pts 45-22yr Insurance coverage will need to be confirmed)  05/27/2024   Pneumonia Vaccine 75 Years old  Completed   INFLUENZA VACCINE  Completed   COVID-19 Vaccine  Completed   Hepatitis C Screening  Completed   Zoster Vaccines- Shingrix  Completed   HPV VACCINES  Aged Out    Physical Exam: There were no vitals filed for this visit. There is no height or weight on file to calculate BMI. Physical Exam Vitals reviewed.  Constitutional:      General: He is not in acute distress.    Appearance: He is not diaphoretic.  HENT:     Head: Normocephalic and atraumatic.     Nose: Nose normal.     Mouth/Throat:     Mouth: Mucous membranes are moist.     Pharynx: Oropharynx is clear.  Eyes:     Conjunctiva/sclera: Conjunctivae normal.     Pupils: Pupils are equal, round, and reactive to light.  Neck:     Thyroid: No thyromegaly.     Vascular: No JVD.     Trachea: No tracheal deviation.  Cardiovascular:     Rate and Rhythm: Normal rate and regular rhythm.     Heart sounds: No murmur heard. Pulmonary:     Effort: Pulmonary effort is normal. No respiratory distress.     Breath sounds: Normal breath sounds. No wheezing.  Abdominal:     General: Bowel sounds are normal. There is no distension.     Palpations: Abdomen is soft.     Tenderness:  There is no abdominal tenderness.  Lymphadenopathy:     Cervical: No cervical adenopathy.  Skin:    General: Skin is warm and dry.  Neurological:     Mental Status: He is alert and oriented to person, place, and time.     Cranial Nerves: No cranial nerve deficit.     Labs reviewed: Basic Metabolic Panel: Recent Labs    08/23/21 0000 01/17/22 0000  NA 143 142  K 4.6 4.3  CL 105 107  CO2 22 21  BUN 9 10  CREATININE 1.0 0.9  CALCIUM 9.6 9.3  TSH 2.71  --    Liver Function Tests: No results for input(s): "AST", "ALT", "ALKPHOS", "BILITOT", "PROT", "ALBUMIN" in the last 8760 hours. No results for input(s): "LIPASE", "AMYLASE" in the last 8760 hours. No results for input(s): "AMMONIA" in the last 8760 hours. CBC: Recent Labs    01/17/22 0000  WBC 6.8  HGB 16.1  HCT 47  PLT 257   Lipid Panel: Recent Labs    08/23/21 0000  CHOL 142  HDL 34*  LDLCALC 88  TRIG 105   Lab Results  Component Value Date   HGBA1C 6.6 01/17/2022    Procedures since last visit: No results found.  Assessment/Plan  1. Type 2 diabetes mellitus with hyperglycemia, without long-term current use of insulin (HCC) A1C not done yet, needed for review Reordered On metformin no s/e Recommend not walking barefoot Eye exam annually   2. Essential hypertension Controlled On Cozaar  3. Pure hypercholesterolemia Continue lipitor   4. Benign essential tremor Propranolol prn  5. Obesity (BMI 30.0-34.9) Losing weight, doing great Continue to increase physical activity and reduce carbs in your diet.   Labs/tests ordered:  * No order type specified * BMP A1C in am  Next appt:  4 months with Dr Lyndel Safe with labs before apt also lipid tsh CBC BMP B12   Total time 11mn:  time greater than 50% of total time spent doing pt counseling and coordination of care

## 2022-05-30 DIAGNOSIS — E1165 Type 2 diabetes mellitus with hyperglycemia: Secondary | ICD-10-CM | POA: Diagnosis not present

## 2022-05-30 DIAGNOSIS — I10 Essential (primary) hypertension: Secondary | ICD-10-CM | POA: Diagnosis not present

## 2022-05-30 LAB — COMPREHENSIVE METABOLIC PANEL: Calcium: 9.9 (ref 8.7–10.7)

## 2022-05-30 LAB — BASIC METABOLIC PANEL
BUN: 14 (ref 4–21)
CO2: 25 — AB (ref 13–22)
Chloride: 102 (ref 99–108)
Creatinine: 1 (ref 0.6–1.3)
Glucose: 122
Potassium: 4.1 mEq/L (ref 3.5–5.1)
Sodium: 140 (ref 137–147)

## 2022-05-30 LAB — HEMOGLOBIN A1C: Hemoglobin A1C: 6.2

## 2022-06-01 ENCOUNTER — Telehealth: Payer: Medicare Other | Admitting: Adult Health

## 2022-06-01 NOTE — Telephone Encounter (Signed)
Patient notified and agreed.  

## 2022-06-01 NOTE — Telephone Encounter (Signed)
Could we call Mr. Giangregorio and let him know that his A1C has come downto 6.2 and his kidney function looks good. No further med changes are needed at this time.   Thanks  Cindi Carbon NP

## 2022-06-23 ENCOUNTER — Emergency Department (HOSPITAL_BASED_OUTPATIENT_CLINIC_OR_DEPARTMENT_OTHER)
Admission: EM | Admit: 2022-06-23 | Discharge: 2022-06-23 | Disposition: A | Discharge status: Home / Self Care | Payer: Medicare Other | Attending: Emergency Medicine | Admitting: Emergency Medicine

## 2022-06-23 ENCOUNTER — Other Ambulatory Visit: Payer: Self-pay

## 2022-06-23 ENCOUNTER — Emergency Department (HOSPITAL_BASED_OUTPATIENT_CLINIC_OR_DEPARTMENT_OTHER): Payer: Medicare Other

## 2022-06-23 ENCOUNTER — Encounter (HOSPITAL_BASED_OUTPATIENT_CLINIC_OR_DEPARTMENT_OTHER): Payer: Self-pay | Admitting: Emergency Medicine

## 2022-06-23 DIAGNOSIS — N400 Enlarged prostate without lower urinary tract symptoms: Secondary | ICD-10-CM | POA: Diagnosis not present

## 2022-06-23 DIAGNOSIS — Z7984 Long term (current) use of oral hypoglycemic drugs: Secondary | ICD-10-CM | POA: Diagnosis not present

## 2022-06-23 DIAGNOSIS — E119 Type 2 diabetes mellitus without complications: Secondary | ICD-10-CM | POA: Diagnosis not present

## 2022-06-23 DIAGNOSIS — S3992XA Unspecified injury of lower back, initial encounter: Secondary | ICD-10-CM | POA: Diagnosis present

## 2022-06-23 DIAGNOSIS — S39012A Strain of muscle, fascia and tendon of lower back, initial encounter: Secondary | ICD-10-CM | POA: Insufficient documentation

## 2022-06-23 DIAGNOSIS — X501XXA Overexertion from prolonged static or awkward postures, initial encounter: Secondary | ICD-10-CM | POA: Diagnosis not present

## 2022-06-23 LAB — URINALYSIS, ROUTINE W REFLEX MICROSCOPIC
Bilirubin Urine: NEGATIVE
Glucose, UA: NEGATIVE mg/dL
Hgb urine dipstick: NEGATIVE
Ketones, ur: NEGATIVE mg/dL
Leukocytes,Ua: NEGATIVE
Nitrite: NEGATIVE
Protein, ur: NEGATIVE mg/dL
Specific Gravity, Urine: 1.018 (ref 1.005–1.030)
pH: 5.5 (ref 5.0–8.0)

## 2022-06-23 MED ORDER — CYCLOBENZAPRINE HCL 5 MG PO TABS: 10.0000 mg | ORAL_TABLET | Freq: Three times a day (TID) | ORAL | 0 refills | Status: DC | PRN

## 2022-06-23 MED ORDER — CYCLOBENZAPRINE HCL 5 MG PO TABS
5.0000 mg | ORAL_TABLET | Freq: Once | ORAL | Status: AC
  Administered 2022-06-23: 5 mg via ORAL
  Filled 2022-06-23: qty 1

## 2022-06-23 MED ORDER — LIDOCAINE 5 % EX PTCH
1.0000 | MEDICATED_PATCH | CUTANEOUS | Status: DC
  Administered 2022-06-23: 1 via TRANSDERMAL
  Filled 2022-06-23: qty 1

## 2022-06-23 MED ORDER — LIDOCAINE 4 % EX PTCH: 1.0000 | MEDICATED_PATCH | CUTANEOUS | 0 refills | Status: DC

## 2022-06-23 MED ORDER — IBUPROFEN 400 MG PO TABS
600.0000 mg | ORAL_TABLET | Freq: Once | ORAL | Status: AC
  Administered 2022-06-23: 600 mg via ORAL
  Filled 2022-06-23: qty 1

## 2022-06-23 NOTE — ED Notes (Signed)
Discharge instructions, follow up care, pain management and prescriptions reviewed and explained, pt verbalized understanding and had no further questions on d/c.

## 2022-06-23 NOTE — Discharge Instructions (Signed)
Please follow-up with your primary care doctor, take the muscle relaxers as needed, be careful as they may increase your falls.  Make sure you using heat and ice for your pain.  You can alternate between ibuprofen and Tylenol for your pain control.  Return to the ER if you have any loss of bowel or bladder, worsening pain.

## 2022-06-23 NOTE — ED Notes (Signed)
Discharge instructions, medications and follow up care reviewed and explained. Pt verbalized understanding and had no further questions.   

## 2022-06-23 NOTE — ED Provider Notes (Signed)
[facility] Provider Note   CSN: [id number] Arrival date & time: 06/23/22  1045     History  Chief Complaint  Patient presents with   Back Pain    Colton Hart is a 77 y.o. male, history of type 2 diabetes, herniated disc, who presents to the ED secondary to right-sided back pain that is been going on for the last week, but worse in the last day.  States that last week he was trying to get over in bed into his toes, and lunged forward, and started having some right back pain, but now radiated to the groin, he states this is now dissipated after taking some naproxen, however last night he was pushing off the chair, was about to get up, and then fell kind of little bit sideways, did not fall the ground or hit his head, kind of just tilted sideways, and did not keep his balance, and now has worsening right-sided back pain, does not radiate down the leg.  No radiation to the groin.  States that it is in the right side, worse with movement, especially when standing and walking, and leaning forward.  Has not taken anything for it today, was taking naproxen last week, which was helpful, but did not want affect his hearing test today.  Denies any numbness or tingling, weakness.  No abdominal pain.     Home Medications Prior to Admission medications   Medication Sig Start Date End Date Taking? Authorizing Provider  cyclobenzaprine (FLEXERIL) 5 MG tablet Take 2 tablets (10 mg total) by mouth 3 (three) times daily as needed for muscle spasms (be careful as this may increase falls. Do not use with alcohol or when driving.). [id number]  Yes Ilaria Much L, PA  lidocaine (HM LIDOCAINE PATCH) 4 % Place 1 patch onto the skin daily. 06/23/22  Yes Aisa Schoeppner L, PA  acetaminophen (TYLENOL) 500 MG tablet Take 500 mg by mouth at bedtime as needed for moderate pain.     [provider]  atorvastatin (LIPITOR) 20 MG tablet Take 1 tablet (20 mg total) by mouth  daily. 04/24/22   Virgie Dad, MD  Cholecalciferol (VITAMIN D3) 2000 units TABS Take 1,000 Units by mouth every evening.    [provider]  losartan (COZAAR) 100 MG tablet Take 1 tablet (100 mg total) by mouth daily. 04/24/22   Virgie Dad, MD  metFORMIN (GLUCOPHAGE-XR) 500 MG 24 hr tablet Take 1 tablet (500 mg total) by mouth 2 (two) times daily with a meal. 04/24/22   Virgie Dad, MD  metroNIDAZOLE (METROCREAM) 0.75 % cream Apply 1 Application topically as needed. 03/22/22   [provider]  naproxen sodium (ALEVE) 220 MG tablet Take 220 mg by mouth daily as needed (pain).    [provider]  propranolol (INDERAL) 10 MG tablet Take 1 tablet (10 mg total) by mouth daily as needed (tremor). 04/21/20   Reed, Tiffany L, DO      Allergies    Asa [aspirin]    Review of Systems   Review of Systems  Musculoskeletal:  Positive for back pain.  Neurological:  Negative for numbness.    Physical Exam Updated Vital Signs BP (!) 150/75 (BP Location: Left Arm)   Pulse 65   Temp 97.8 F (36.6 C) (Oral)   Resp 20   Ht 5\' 11"  (1.803 m)   Wt 104.3 kg   SpO2 98%   BMI 32.08 kg/m  Physical Exam  Constitutional:      General: He is not in acute distress.    Appearance: He is well-developed. He is not toxic-appearing.  HENT:     Head: Normocephalic and atraumatic.  Abdominal:     General: There is no distension.     Palpations: Abdomen is soft.     Tenderness: There is no abdominal tenderness.  Musculoskeletal:     Cervical back: Normal range of motion and neck supple. No spinous process tenderness or muscular tenderness.     Comments: No obvious deformity, appreciable swelling, erythema, ecchymosis, significant open wounds, or increased warmth.  Back: No point/focal vertebral tenderness, no palpable step off or crepitus.  Tenderness to palpation of right paraspinal muscles of the lumbar. Lower extremities: ranging @ all major joints. No focal bony tenderness  .   Skin:    General: Skin is warm and dry.     Findings: No rash.  Neurological:     Mental Status: He is alert.     Comments: Sensation grossly intact to bilateral lower extremities. 5/5 symmetric strength with plantar/dorsiflexion bilaterally. Gait is intact without obvious foot drop.  Negative straight leg raise of bilateral lower extremities.     ED Results / Procedures / Treatments   Labs (all labs ordered are listed, but only abnormal results are displayed) Labs Reviewed  URINALYSIS, ROUTINE W REFLEX MICROSCOPIC    EKG None  Radiology CT Renal Stone Study  Result Date: 06/23/2022 CLINICAL DATA:  Left lower back pain described as radiating to the groin with certain movements. EXAM: CT ABDOMEN AND PELVIS WITHOUT CONTRAST TECHNIQUE: Multidetector CT imaging of the abdomen and pelvis was performed following the standard protocol without IV contrast. RADIATION DOSE REDUCTION: This exam was performed according to the departmental dose-optimization program which includes automated exposure control, adjustment of the mA and/or kV according to patient size and/or use of iterative reconstruction technique. COMPARISON:  None Available. FINDINGS: Lower chest: No acute abnormality. Mild subpleural fibrotic changes. Hepatobiliary: No focal liver abnormality is seen. No gallstones, gallbladder wall thickening, or biliary dilatation. Pancreas: Unremarkable. No pancreatic ductal dilatation or surrounding inflammatory changes. Spleen: Normal in size without focal abnormality. Adrenals/Urinary Tract: Adrenal glands are unremarkable. Kidneys are normal, without renal calculi, suspicious focal lesion, or hydronephrosis. Bladder is unremarkable. Stomach/Bowel: Kareena Arrambide hiatal hernia. Stomach is within normal limits. Appendix appears normal. No evidence of bowel wall thickening, distention, or inflammatory changes. Vascular/Lymphatic: Aortic atherosclerosis. No enlarged abdominal or pelvic lymph nodes.  Reproductive: Prostatomegaly. Other: No abdominal wall hernia or abnormality. No abdominopelvic ascites. A 1.1 x 0.4 cm lenticular hyperdense mass is noted along the posterior peritoneum at the level of the upper third of the right kidney (series 2, image 22). This is unchanged compared to prior MR lumbar spine 10/27/2016. A second lenticular hyperdense mass is noted slightly inferiorly, measuring approximately 1.8 x 0.8 cm (series 2, image 26). This finding was out of the field of view on prior studies. Musculoskeletal: Multilevel flowing anterior osteophytes in the visualized distal thoracic spine consistent with diffuse idiopathic skeletal hyperostosis. Multilevel degenerative changes in the distal thoracic spine and lumbar spine. No suspicious osseous lesion. IMPRESSION: 1. No acute findings in the abdomen or pelvis. Specifically, no nephrolithiasis or hydronephrosis. 2. Incidentally noted two Michaline Kindig lenticular hyperdense masses along the posterior peritoneum in the right upper abdomen. One of the masses is unchanged compared to prior MR lumbar spine 10/27/2016; the other is out of the field of view on prior studies. These are indeterminate, but favored  to be benign. Recommend follow-up in 6 months with contrast-enhanced CT of the abdomen to assess stability of the larger mass which was not previously visualized. Alternatively, if clinically desired, MRI of the abdomen with intravenous contrast could be performed on a non-emergent basis for further characterization. 3. Prostatomegaly. 4. Other incidental findings as described in the body of the report. 5. Aortic atherosclerosis. Aortic Atherosclerosis (ICD10-I70.0). Electronically Signed   By: Ileana Roup M.D.   On: 06/23/2022 13:11    Procedures Procedures    Medications Ordered in ED Medications  lidocaine (LIDODERM) 5 % 1 patch (1 patch Transdermal Patch Applied 06/23/22 1141)  ibuprofen (ADVIL) tablet 600 mg (600 mg Oral Given 06/23/22 1139)   cyclobenzaprine (FLEXERIL) tablet 5 mg (5 mg Oral Given 06/23/22 1139)    ED Course/ Medical Decision Making/ A&P                             Medical Decision Making Is a 77 year old male, here for low right-sided back pain x 1 week, worse after accidentally kind of losing his balance the other day.  No trauma.  Tenderness to palpation of right flank/paraspinal muscles of the lumbar spine.  Negative straight leg raise.  Likely muscle strain.  Evaluated by Dr. Oswaldo Milian, he recommends CT renal, to evaluate aorta, and to make sure no urinary symptoms at urinalysis.  Amount and/or Complexity of Data Reviewed Labs: ordered.    Details: Urinalysis unremarkable Radiology: ordered.    Details: CT renal unremarkable except for mass. Discussion of management or test interpretation with external provider(s): Discussed with patient, findings of CT renal, 2 masses recommend CT abdomen pelvis in 6 months for repeat evaluation of the mass.  Likely thought to be benign.  He will follow-up with his primary care for this.  Additionally discussed negative findings, likely muscle strain.  Muscle relaxers, lidocaine patch sent to the pharmacy.  Strict return precautions.  Ibuprofen Tylenol at home for pain control.  Risk OTC drugs. Prescription drug management.   Final Clinical Impression(s) / ED Diagnoses Final diagnoses:  Strain of lumbar region, initial encounter    Rx / DC Orders ED Discharge Orders          Ordered    cyclobenzaprine (FLEXERIL) 5 MG tablet  3 times daily PRN        06/23/22 1336    lidocaine (HM LIDOCAINE PATCH) 4 %  Every 24 hours        06/23/22 1336              Dara Camargo, [place], [place] 06/23/22 1339    Ezequiel Essex, MD 06/23/22 1709

## 2022-06-23 NOTE — ED Triage Notes (Signed)
Pt arrived POV, ambulatory, caox4. Pt c/o L lower back pain that is described as lower left radiating to the groin on certain movement. Pt states pain started approx 2 weeks ago when he was bending over to put his socks on stating "it felt like I strained my lower back." Pt further reports last night he lost his balance however didn't fall, which exacerbated the pain. Pt denies numbness, tingling in extremities, and denies any other complaints.

## 2022-07-27 DIAGNOSIS — Z23 Encounter for immunization: Secondary | ICD-10-CM | POA: Diagnosis not present

## 2022-09-07 ENCOUNTER — Encounter: Payer: Self-pay | Admitting: Orthopedic Surgery

## 2022-09-07 ENCOUNTER — Telehealth (INDEPENDENT_AMBULATORY_CARE_PROVIDER_SITE_OTHER): Payer: Medicare Other | Admitting: Orthopedic Surgery

## 2022-09-07 DIAGNOSIS — U071 COVID-19: Secondary | ICD-10-CM | POA: Diagnosis not present

## 2022-09-07 NOTE — Patient Instructions (Addendum)
Vitamin C 1000 mg daily x 10 days  Vitamin D 4000 units daily x 10 days  Zinc 50 mg daily x 10 days  Salt water gargles, chloraseptic spray/lozenges for sore throat  Tylenol 650 mg for fevers  Consider mucinex for productive cough  Please contact provider if symptoms do not improve for worsen

## 2022-09-07 NOTE — Progress Notes (Signed)
Careteam: Patient Care Team: Colton Gammon, MD as PCP - General (Internal Medicine)  Seen by: Colton Hart, AGNP-C  PLACE OF SERVICE:  [facility]  Advanced Directive information Does Patient Have a Medical Advance Directive?: Yes, Type of Advance Directive: Healthcare Power of [place];Living will;Out of facility DNR (pink MOST or yellow form), Does patient want to make changes to medical advance directive?: No - Patient declined  Allergies  Allergen Reactions   Asa [Aspirin] Other (See Comments)    Seizures     Chief Complaint  Patient presents with   Acute Visit    Patient tested Positive for Covid-19. Patient complains of fever, sore throat, and cough.      HPI: Patient is a 77 y.o. male seen today via virtual visit due to recent positive covid test.   He tested positive for covid 06/04. Symptoms include sore throat, productive cough, fever and malaise. He is able to swallow fluids and foods without difficulty. He has not taken actual temperature, but reports feeling warm. Cough dry and productive with yellow/tan phlegm. Denies N/V, diarrhea, headaches or nasal congestion. He has completed initial covid vaccine series and one booster. Treatment options discussed. He is not interested in Paxlovid. He would like to try supplements and rest.   Review of Systems:  Review of Systems  Constitutional:  Positive for fever and malaise/fatigue.  HENT:  Positive for sore throat. Negative for congestion.   Eyes:  Negative for blurred vision.  Respiratory:  Positive for cough and sputum production. Negative for shortness of breath and wheezing.   Cardiovascular:  Negative for chest pain and leg swelling.  Gastrointestinal:  Negative for nausea and vomiting.  Musculoskeletal:  Negative for myalgias.  Neurological:  Negative for dizziness and headaches.  Psychiatric/Behavioral:  Negative for depression. The patient is not nervous/anxious.     Past Medical History:  Diagnosis Date    Allergic rhinitis    Arthritis    Cancer (HCC)    basal cell nose   Carotid atherosclerosis, bilateral    dvt  ?14   Cataract    Removal both eyes    DVT (deep venous thrombosis) (HCC) 11/22/2012   GERD (gastroesophageal reflux disease)    occ   High blood pressure    Hypercholesteremia    Hyperlipidemia    Left atrial dilatation    LVH (left ventricular hypertrophy)    Obesity    Seizures (HCC)    "in 20's from aspirin"   Past Surgical History:  Procedure Laterality Date   CATARACT EXTRACTION, BILATERAL     COLONOSCOPY  2015   LUMBAR LAMINECTOMY/DECOMPRESSION MICRODISCECTOMY Left 11/30/2016   Procedure: LEFT LUMBAR FOUR-FIVE LAMINECTOMY FOR FACET/SYNOVIAL CYST;  Surgeon: Coletta Memos, MD;  Location: [facility] OR;  Service: Neurosurgery;  Laterality: Left;  LAMINECTOMY FOR FACET/SYNOVIAL CYST LUMBAR 4-LUMBAR 5, LEFT   right leg fracture s/p surgical repair with screws Right 1966   SKIN CANCER EXCISION  2010   from his nose, no recurrence   TOTAL KNEE ARTHROPLASTY Right 2015   Social History:   reports that he quit smoking about 33 years ago. His smoking use included cigarettes. He has a 20.00 pack-year smoking history. He has never used smokeless tobacco. He reports that he does not drink alcohol and does not use drugs.  Family History  Problem Relation Age of Onset   Alzheimer's disease Mother    Cancer Mother        Skin   Alzheimer's disease Father  Cancer Father        colon, liver, skin   Colon polyps Father    Colon cancer Father    Esophageal cancer Neg Hx    Rectal cancer Neg Hx    Stomach cancer Neg Hx     Medications: Patient's Medications  New Prescriptions   No medications on file  Previous Medications   ACETAMINOPHEN (TYLENOL) 500 MG TABLET    Take 500 mg by mouth at bedtime as needed for moderate pain.    ATORVASTATIN (LIPITOR) 20 MG TABLET    Take 1 tablet (20 mg total) by mouth daily.   CHOLECALCIFEROL (VITAMIN D3) 2000 UNITS TABS    Take 1,000  Units by mouth every evening.   CYCLOBENZAPRINE (FLEXERIL) 5 MG TABLET    Take 2 tablets (10 mg total) by mouth 3 (three) times daily as needed for muscle spasms (be careful as this may increase falls. Do not use with alcohol or when driving.).   LIDOCAINE (HM LIDOCAINE PATCH) 4 %    Place 1 patch onto the skin daily.   LOSARTAN (COZAAR) 100 MG TABLET    Take 1 tablet (100 mg total) by mouth daily.   METFORMIN (GLUCOPHAGE-XR) 500 MG 24 HR TABLET    Take 1 tablet (500 mg total) by mouth 2 (two) times daily with a meal.   METRONIDAZOLE (METROCREAM) 0.75 % CREAM    Apply 1 Application topically as needed.   NAPROXEN SODIUM (ALEVE) 220 MG TABLET    Take 220 mg by mouth daily as needed (pain).   PROPRANOLOL (INDERAL) 10 MG TABLET    Take 1 tablet (10 mg total) by mouth daily as needed (tremor).  Modified Medications   No medications on file  Discontinued Medications   No medications on file    Physical Exam:  There were no vitals filed for this visit. There is no height or weight on file to calculate BMI. Wt Readings from Last 3 Encounters:  06/23/22 230 lb (104.3 kg)  05/29/22 229 lb (103.9 kg)  01/23/22 237 lb (107.5 kg)    Physical Exam Vitals reviewed: exam limited due to virtual exam.  Constitutional:      Appearance: Normal appearance.  Neurological:     Mental Status: He is alert.     Labs reviewed: Basic Metabolic Panel: Recent Labs    01/17/22 0000 05/30/22 0000  NA 142 140  K 4.3 4.1  CL 107 102  CO2 21 25*  BUN 10 14  CREATININE 0.9 1.0  CALCIUM 9.3 9.9   Liver Function Tests: No results for input(s): "AST", "ALT", "ALKPHOS", "BILITOT", "PROT", "ALBUMIN" in the last 8760 hours. No results for input(s): "LIPASE", "AMYLASE" in the last 8760 hours. No results for input(s): "AMMONIA" in the last 8760 hours. CBC: Recent Labs    01/17/22 0000  WBC 6.8  HGB 16.1  HCT 47  PLT 257   Lipid Panel: No results for input(s): "CHOL", "HDL", "LDLCALC", "TRIG",  "CHOLHDL", "LDLDIRECT" in the last 8760 hours. TSH: No results for input(s): "TSH" in the last 8760 hours. A1C: Lab Results  Component Value Date   HGBA1C 6.2 05/30/2022     Assessment/Plan 1. COVID - 06/04 home test positive - symptoms: fever, sore throat, productive cough, malaise - not interested in Paxlovid -Vitamin C 1000 mg daily x 10 days -Vitamin D 4000 units daily x 10 days -Zinc 50 mg daily x 10 days -Salt water gargles, chloraseptic spray/lozenges for sore throat -Tylenol 650 mg for fevers -  Consider mucinex for productive cough -Please contact provider if symptoms do not improve for worsen  Virtual Visit   I connected with Colton Hart via virtual visit and verified that I am speaking with the correct person using two identifiers.  Location: [facility] Patient:Colton Hart  Provider: Octavia Heir, NP    I discussed the limitations, risks, security and privacy concerns of performing an evaluation and management service by telephone and the availability of in person appointments. I also discussed with the patient that there may be a patient responsible charge related to this service. The patient expressed understanding and agreed to proceed.   I discussed the assessment and treatment plan with the patient. The patient was provided an opportunity to ask questions and all were answered. The patient agreed with the plan and demonstrated an understanding of the instructions.   The patient was advised to call back or seek an in-person evaluation if the symptoms worsen or if the condition fails to improve as anticipated.  I provided 11 minutes of face-to-face time during this encounter.  Riggins Cisek Norval Gable, NP  Avs printed and mailed     Next appt: Visit date not found  Raahil Ong Scherry Ran  [facility] [phone]

## 2022-09-07 NOTE — Progress Notes (Signed)
   This service is provided via telemedicine  No vital signs collected/recorded due to the encounter was a telemedicine visit.   Location of patient (ex: home, work):  Home  Patient consents to a telephone visit:  Yes  Location of the provider (ex: office, home):  [facility] Office.   Name of any referring provider:  Gupta, Anjali L, MD   Names of all persons participating in the telemedicine service and their role in the encounter:  Patient, Colton Hart, RMA, Amy Fargo, NP.    Time spent on call: 8 minutes spent on the phone with Medical Assistant.   

## 2022-09-20 DIAGNOSIS — L905 Scar conditions and fibrosis of skin: Secondary | ICD-10-CM | POA: Diagnosis not present

## 2022-09-20 DIAGNOSIS — L814 Other melanin hyperpigmentation: Secondary | ICD-10-CM | POA: Diagnosis not present

## 2022-09-20 DIAGNOSIS — L719 Rosacea, unspecified: Secondary | ICD-10-CM | POA: Diagnosis not present

## 2022-09-20 DIAGNOSIS — L570 Actinic keratosis: Secondary | ICD-10-CM | POA: Diagnosis not present

## 2022-09-20 DIAGNOSIS — L821 Other seborrheic keratosis: Secondary | ICD-10-CM | POA: Diagnosis not present

## 2022-09-26 DIAGNOSIS — E1165 Type 2 diabetes mellitus with hyperglycemia: Secondary | ICD-10-CM | POA: Diagnosis not present

## 2022-09-26 DIAGNOSIS — E7800 Pure hypercholesterolemia, unspecified: Secondary | ICD-10-CM | POA: Diagnosis not present

## 2022-09-26 DIAGNOSIS — I10 Essential (primary) hypertension: Secondary | ICD-10-CM | POA: Diagnosis not present

## 2022-09-26 LAB — LIPID PANEL
Cholesterol: 138 (ref 0–200)
HDL: 38 (ref 35–70)
LDL Cholesterol: 77
LDl/HDL Ratio: 3.7
Triglycerides: 117 (ref 40–160)

## 2022-09-26 LAB — TSH: TSH: 3.05 (ref 0.41–5.90)

## 2022-09-26 LAB — CBC AND DIFFERENTIAL
HCT: 42 (ref 41–53)
Hemoglobin: 14.4 (ref 13.5–17.5)
Platelets: 290 10*3/uL (ref 150–400)
WBC: 6.8

## 2022-09-26 LAB — VITAMIN B12: Vitamin B-12: 357

## 2022-09-26 LAB — CBC: RBC: 4.34 (ref 3.87–5.11)

## 2022-09-26 LAB — HEMOGLOBIN A1C: Hemoglobin A1C: 6.4

## 2022-10-03 ENCOUNTER — Non-Acute Institutional Stay: Payer: Medicare Other | Admitting: Internal Medicine

## 2022-10-03 ENCOUNTER — Encounter: Payer: Self-pay | Admitting: Internal Medicine

## 2022-10-03 VITALS — BP 142/76 | HR 82 | Temp 97.7°F | Resp 16 | Ht 71.0 in | Wt 224.2 lb

## 2022-10-03 DIAGNOSIS — E538 Deficiency of other specified B group vitamins: Secondary | ICD-10-CM | POA: Diagnosis not present

## 2022-10-03 DIAGNOSIS — E7800 Pure hypercholesterolemia, unspecified: Secondary | ICD-10-CM

## 2022-10-03 DIAGNOSIS — I10 Essential (primary) hypertension: Secondary | ICD-10-CM | POA: Diagnosis not present

## 2022-10-03 DIAGNOSIS — E1165 Type 2 diabetes mellitus with hyperglycemia: Secondary | ICD-10-CM

## 2022-10-03 DIAGNOSIS — G250 Essential tremor: Secondary | ICD-10-CM

## 2022-10-03 NOTE — Patient Instructions (Signed)
Take OTC Vit B12 500 mcg everyday.

## 2022-10-03 NOTE — Progress Notes (Addendum)
Location:  [facility] features editor of Service:  Clinic (12)  Provider:   Code Status:  Goals of Care:     10/03/2022   10:43 AM  Advanced Directives  Does Patient Have a Medical Advance Directive? Yes  Type of Estate agent of [place];Living will;Out of facility DNR (pink MOST or yellow form)  Does patient want to make changes to medical advance directive? No - Patient declined  Copy of Healthcare Power of Attorney in Chart? Yes - validated most recent copy scanned in chart (See row information)     Chief Complaint  Patient presents with   Medical Management of Chronic Issues    Patient is being seen for 4 month follow up with labs   Health Maintenance    Discussed the need for hep c screening,foot exam, eye exam and diabetic kidney     HPI: Patient is a 77 y.o. male seen today for medical management of chronic diseases.   Diabetes Type 2  A1C in good levels Continue Metformin Sees Ophthalmologist annually  Back pain Flair up in 03/24 Doing well No Issue rarely takes Aleve Essential tremors Takes Propanolol Hypertension BPH  Got covid after [place] visit Recovered No issues today   Past Medical History:  Diagnosis Date   Allergic rhinitis    Arthritis    Cancer (HCC)    basal cell nose   Carotid atherosclerosis, bilateral    dvt  ?14   Cataract    Removal both eyes    DVT (deep venous thrombosis) (HCC) 11/22/2012   GERD (gastroesophageal reflux disease)    occ   High blood pressure    Hypercholesteremia    Hyperlipidemia    Left atrial dilatation    LVH (left ventricular hypertrophy)    Obesity    Seizures (HCC)    "in 20's from aspirin"    Past Surgical History:  Procedure Laterality Date   CATARACT EXTRACTION, BILATERAL     COLONOSCOPY  2015   LUMBAR LAMINECTOMY/DECOMPRESSION MICRODISCECTOMY Left 11/30/2016   Procedure: LEFT LUMBAR FOUR-FIVE LAMINECTOMY FOR FACET/SYNOVIAL CYST;  Surgeon: Coletta Memos, MD;   Location: [facility] OR;  Service: Neurosurgery;  Laterality: Left;  LAMINECTOMY FOR FACET/SYNOVIAL CYST LUMBAR 4-LUMBAR 5, LEFT   right leg fracture s/p surgical repair with screws Right 1966   SKIN CANCER EXCISION  2010   from his nose, no recurrence   TOTAL KNEE ARTHROPLASTY Right 2015    Allergies  Allergen Reactions   Asa [Aspirin] Other (See Comments)    Seizures     Outpatient Encounter Medications as of 10/03/2022  Medication Sig   acetaminophen (TYLENOL) 500 MG tablet Take 500 mg by mouth at bedtime as needed for moderate pain.    atorvastatin (LIPITOR) 20 MG tablet Take 1 tablet (20 mg total) by mouth daily.   Cholecalciferol (VITAMIN D3) 2000 units TABS Take 1,000 Units by mouth every evening.   lidocaine (HM LIDOCAINE PATCH) 4 % Place 1 patch onto the skin daily.   losartan (COZAAR) 100 MG tablet Take 1 tablet (100 mg total) by mouth daily.   metFORMIN (GLUCOPHAGE-XR) 500 MG 24 hr tablet Take 1 tablet (500 mg total) by mouth 2 (two) times daily with a meal.   metroNIDAZOLE (METROCREAM) 0.75 % cream Apply 1 Application topically as needed.   naproxen sodium (ALEVE) 220 MG tablet Take 220 mg by mouth daily as needed (pain).   propranolol (INDERAL) 10 MG tablet Take 1 tablet (10 mg total) by  mouth daily as needed (tremor).   [DISCONTINUED] cyclobenzaprine (FLEXERIL) 5 MG tablet Take 2 tablets (10 mg total) by mouth 3 (three) times daily as needed for muscle spasms (be careful as this may increase falls. Do not use with alcohol or when driving.).   No facility-administered encounter medications on file as of 10/03/2022.    Review of Systems:  Review of Systems  Constitutional:  Negative for activity change, appetite change and unexpected weight change.  HENT: Negative.    Respiratory:  Negative for cough and shortness of breath.   Cardiovascular:  Negative for leg swelling.  Gastrointestinal:  Negative for constipation.  Genitourinary:  Negative for frequency.  Musculoskeletal:   Negative for arthralgias, gait problem and myalgias.  Skin: Negative.  Negative for rash.  Neurological:  Negative for dizziness and weakness.  Psychiatric/Behavioral:  Negative for confusion and sleep disturbance.   All other systems reviewed and are negative.   Health Maintenance  Topic Date Due   OPHTHALMOLOGY EXAM  Never done   Diabetic kidney evaluation - Urine ACR  Never done   COVID-19 Vaccine (8 - 2023-24 season) 10/19/2022 (Originally 09/21/2022)   INFLUENZA VACCINE  11/02/2022   Medicare Annual Wellness (AWV)  03/01/2023   HEMOGLOBIN A1C  03/28/2023   DTaP/Tdap/Td (2 - Td or Tdap) 04/04/2023   Diabetic kidney evaluation - eGFR measurement  05/31/2023   FOOT EXAM  10/03/2023   Colonoscopy  05/27/2024   Pneumonia Vaccine 38+ Years old  Completed   Hepatitis C Screening  Completed   Zoster Vaccines- Shingrix  Completed   HPV VACCINES  Aged Out    Physical Exam: Vitals:   10/03/22 1040 10/03/22 1121  BP: (!) 142/82 (!) 142/76  Pulse: 82   Resp: 16   Temp: 97.7 F (36.5 C)   TempSrc: Temporal   SpO2: 96%   Weight: 224 lb 3.2 oz (101.7 kg)   Height: 5\' 11"  (1.803 m)    Body mass index is 31.27 kg/m. Physical Exam Vitals reviewed.  Constitutional:      Appearance: Normal appearance.  HENT:     Head: Normocephalic.     Nose: Nose normal.     Mouth/Throat:     Mouth: Mucous membranes are moist.     Pharynx: Oropharynx is clear.  Eyes:     Pupils: Pupils are equal, round, and reactive to light.  Cardiovascular:     Rate and Rhythm: Normal rate and regular rhythm.     Pulses: Normal pulses.     Heart sounds: No murmur heard. Pulmonary:     Effort: Pulmonary effort is normal. No respiratory distress.     Breath sounds: Normal breath sounds. No rales.  Abdominal:     General: Abdomen is flat. Bowel sounds are normal.     Palpations: Abdomen is soft.  Musculoskeletal:        General: No swelling.     Cervical back: Neck supple.     Comments: Sensory exam  was normal No Open Wounds or calluses Good Pulses  Skin:    General: Skin is warm.  Neurological:     General: No focal deficit present.     Mental Status: He is alert and oriented to person, place, and time.  Psychiatric:        Mood and Affect: Mood normal.        Thought Content: Thought content normal.     Labs reviewed: Basic Metabolic Panel: Recent Labs    01/17/22 0000 05/30/22 0000 09/26/22  0000  NA 142 140  --   K 4.3 4.1  --   CL 107 102  --   CO2 21 25*  --   BUN 10 14  --   CREATININE 0.9 1.0  --   CALCIUM 9.3 9.9  --   TSH  --   --  3.05   Liver Function Tests: No results for input(s): "AST", "ALT", "ALKPHOS", "BILITOT", "PROT", "ALBUMIN" in the last 8760 hours. No results for input(s): "LIPASE", "AMYLASE" in the last 8760 hours. No results for input(s): "AMMONIA" in the last 8760 hours. CBC: Recent Labs    01/17/22 0000 09/26/22 0000  WBC 6.8 6.8  HGB 16.1 14.4  HCT 47 42  PLT 257 290   Lipid Panel: Recent Labs    09/26/22 0000  CHOL 138  HDL 38  LDLCALC 77  TRIG 117   Lab Results  Component Value Date   HGBA1C 6.4 09/26/2022    Procedures since last visit: No results found.  Assessment/Plan 1. Type 2 diabetes mellitus with hyperglycemia, without long-term current use of insulin (HCC) A1C good Foot exam normal Follow with ophthalmologist Continue Diet and exercise  2. Essential hypertension BP mild high Will Not change anything today Continue to monitor On Cozaar  3. Pure hypercholesterolemia On statin LDL good  4. Benign essential tremor Propanolol  5. Vitamin B12 deficiency B 12 on lower side Start supplementing with OTC B 12 500 mcg QD   Labs/tests ordered:  A1c Bmp,CBC,UA for Microalbumin Next appt:  03/05/2023

## 2022-12-08 DIAGNOSIS — H26493 Other secondary cataract, bilateral: Secondary | ICD-10-CM | POA: Diagnosis not present

## 2022-12-08 DIAGNOSIS — H5203 Hypermetropia, bilateral: Secondary | ICD-10-CM | POA: Diagnosis not present

## 2022-12-08 DIAGNOSIS — H532 Diplopia: Secondary | ICD-10-CM | POA: Diagnosis not present

## 2022-12-08 DIAGNOSIS — E119 Type 2 diabetes mellitus without complications: Secondary | ICD-10-CM | POA: Diagnosis not present

## 2022-12-13 DIAGNOSIS — H26491 Other secondary cataract, right eye: Secondary | ICD-10-CM | POA: Diagnosis not present

## 2022-12-20 DIAGNOSIS — L905 Scar conditions and fibrosis of skin: Secondary | ICD-10-CM | POA: Diagnosis not present

## 2022-12-20 DIAGNOSIS — L570 Actinic keratosis: Secondary | ICD-10-CM | POA: Diagnosis not present

## 2022-12-20 DIAGNOSIS — D229 Melanocytic nevi, unspecified: Secondary | ICD-10-CM | POA: Diagnosis not present

## 2022-12-20 DIAGNOSIS — L814 Other melanin hyperpigmentation: Secondary | ICD-10-CM | POA: Diagnosis not present

## 2022-12-20 DIAGNOSIS — H26492 Other secondary cataract, left eye: Secondary | ICD-10-CM | POA: Diagnosis not present

## 2022-12-20 DIAGNOSIS — L821 Other seborrheic keratosis: Secondary | ICD-10-CM | POA: Diagnosis not present

## 2023-01-04 DIAGNOSIS — Z23 Encounter for immunization: Secondary | ICD-10-CM | POA: Diagnosis not present

## 2023-01-30 DIAGNOSIS — E119 Type 2 diabetes mellitus without complications: Secondary | ICD-10-CM | POA: Diagnosis not present

## 2023-01-30 LAB — COMPREHENSIVE METABOLIC PANEL
Albumin: 4.1 (ref 3.5–5.0)
Calcium: 9 (ref 8.7–10.7)
Globulin: 2.3

## 2023-01-30 LAB — BASIC METABOLIC PANEL
BUN: 13 (ref 4–21)
CO2: 24 — AB (ref 13–22)
Chloride: 106 (ref 99–108)
Creatinine: 0.8 (ref 0.6–1.3)
Glucose: 110
Potassium: 4.1 meq/L (ref 3.5–5.1)
Sodium: 142 (ref 137–147)

## 2023-01-30 LAB — HEPATIC FUNCTION PANEL
ALT: 48 U/L — AB (ref 10–40)
AST: 32 (ref 14–40)
Alkaline Phosphatase: 57 (ref 25–125)
Bilirubin, Total: 0.5

## 2023-01-30 LAB — MICROALBUMIN, URINE: Microalb, Ur: <1.2

## 2023-01-30 LAB — PROTEIN / CREATININE RATIO, URINE: Creatinine, Urine: <1.2

## 2023-01-30 LAB — CBC AND DIFFERENTIAL
HCT: 46 (ref 41–53)
HCT: 46 (ref 41–53)
Hemoglobin: 15.6 (ref 13.5–17.5)
Hemoglobin: 15.6 (ref 13.5–17.5)
Platelets: 254 10*3/uL (ref 150–400)
Platelets: 254 10*3/uL (ref 150–400)
WBC: 6.8
WBC: 6.8

## 2023-01-30 LAB — HEMOGLOBIN A1C: Hemoglobin A1C: 6

## 2023-01-30 LAB — CBC
RBC: 4.63 (ref 3.87–5.11)
RBC: 4.63 (ref 3.87–5.11)

## 2023-02-02 NOTE — Progress Notes (Unsigned)
Location:  [facility]  POS: Clinic  Provider: Fletcher Anon, ANP  Code Status: DNR Goals of Care:     10/03/2022   10:43 AM  Advanced Directives  Does Patient Have a Medical Advance Directive? Yes  Type of Estate agent of [place];Living will;Out of facility DNR (pink MOST or yellow form)  Does patient want to make changes to medical advance directive? No - Patient declined  Copy of Healthcare Power of Attorney in Chart? Yes - validated most recent copy scanned in chart (See row information)     No chief complaint on file.   HPI: Patient is a 77 y.o. male seen today for medical management of chronic diseases.    Married with 2 children lives in [place]  DM II: Metformin increased in Oct of 2023 due to elevated blood sugars.  Has some loss of sensation in feet, no pain Doesn't wear shoes sometimes due to dermatitis  Essential tremor: Takes propranolol prn for writing. Scheduled dosing made his mouth dry  HLD on lipitor Lab Results  Component Value Date   LDLCALC 77 09/26/2022     Exercise: walk 30-45 min a few times a week   Diet: eats healthy with high volume. Losing weight Wt Readings from Last 3 Encounters:  10/03/22 224 lb 3.2 oz (101.7 kg)  06/23/22 230 lb (104.3 kg)  05/29/22 229 lb (103.9 kg)     Hx of BCC with cryotherapy, DVT, seizure when taking asa, DVT, carotid atherosclerosis, obesity, LVH  Has had enlarged prostate, no current issues. Did not tolerate meds in the past Past Medical History:  Diagnosis Date   Allergic rhinitis    Arthritis    Cancer (HCC)    basal cell nose   Carotid atherosclerosis, bilateral    dvt  ?14   Cataract    Removal both eyes    DVT (deep venous thrombosis) (HCC) 11/22/2012   GERD (gastroesophageal reflux disease)    occ   High blood pressure    Hypercholesteremia    Hyperlipidemia    Left atrial dilatation    LVH (left ventricular hypertrophy)    Obesity    Seizures (HCC)    "in 20's  from aspirin"    Past Surgical History:  Procedure Laterality Date   CATARACT EXTRACTION, BILATERAL     COLONOSCOPY  2015   LUMBAR LAMINECTOMY/DECOMPRESSION MICRODISCECTOMY Left 11/30/2016   Procedure: LEFT LUMBAR FOUR-FIVE LAMINECTOMY FOR FACET/SYNOVIAL CYST;  Surgeon: Coletta Memos, MD;  Location: [facility] OR;  Service: Neurosurgery;  Laterality: Left;  LAMINECTOMY FOR FACET/SYNOVIAL CYST LUMBAR 4-LUMBAR 5, LEFT   right leg fracture s/p surgical repair with screws Right 1966   SKIN CANCER EXCISION  2010   from his nose, no recurrence   TOTAL KNEE ARTHROPLASTY Right 2015    Allergies  Allergen Reactions   Asa [Aspirin] Other (See Comments)    Seizures     Outpatient Encounter Medications as of 02/05/2023  Medication Sig   acetaminophen (TYLENOL) 500 MG tablet Take 500 mg by mouth at bedtime as needed for moderate pain.    atorvastatin (LIPITOR) 20 MG tablet Take 1 tablet (20 mg total) by mouth daily.   Cholecalciferol (VITAMIN D3) 2000 units TABS Take 1,000 Units by mouth every evening.   lidocaine (HM LIDOCAINE PATCH) 4 % Place 1 patch onto the skin daily.   losartan (COZAAR) 100 MG tablet Take 1 tablet (100 mg total) by mouth daily.   metFORMIN (GLUCOPHAGE-XR) 500 MG 24 hr tablet Take 1  tablet (500 mg total) by mouth 2 (two) times daily with a meal.   metroNIDAZOLE (METROCREAM) 0.75 % cream Apply 1 Application topically as needed.   naproxen sodium (ALEVE) 220 MG tablet Take 220 mg by mouth daily as needed (pain).   propranolol (INDERAL) 10 MG tablet Take 1 tablet (10 mg total) by mouth daily as needed (tremor).   No facility-administered encounter medications on file as of 02/05/2023.    Review of Systems:  Review of Systems  Constitutional:  Negative for chills, diaphoresis and fever.  Respiratory:  Negative for cough and wheezing.   Cardiovascular:  Negative for chest pain and leg swelling.  Gastrointestinal:  Negative for abdominal pain, constipation, diarrhea, nausea and  vomiting.  Genitourinary:  Negative for dysuria and urgency.  Musculoskeletal:  Negative for back pain, myalgias and neck pain.  Skin:  Negative for rash.  Neurological:  Positive for numbness (feet). Negative for weakness.  All other systems reviewed and are negative.   Health Maintenance  Topic Date Due   OPHTHALMOLOGY EXAM  Never done   Diabetic kidney evaluation - Urine ACR  Never done   INFLUENZA VACCINE  11/02/2022   Medicare Annual Wellness (AWV)  03/01/2023   DTaP/Tdap/Td (2 - Td or Tdap) 04/04/2023   HEMOGLOBIN A1C  07/31/2023   FOOT EXAM  10/03/2023   Diabetic kidney evaluation - eGFR measurement  01/30/2024   Colonoscopy  05/27/2024   Pneumonia Vaccine 23+ Years old  Completed   COVID-19 Vaccine  Completed   Hepatitis C Screening  Completed   Zoster Vaccines- Shingrix  Completed   HPV VACCINES  Aged Out    Physical Exam: There were no vitals filed for this visit. There is no height or weight on file to calculate BMI. Physical Exam Vitals reviewed.  Constitutional:      General: He is not in acute distress.    Appearance: He is not diaphoretic.  HENT:     Head: Normocephalic and atraumatic.     Nose: Nose normal.     Mouth/Throat:     Mouth: Mucous membranes are moist.     Pharynx: Oropharynx is clear.  Eyes:     Conjunctiva/sclera: Conjunctivae normal.     Pupils: Pupils are equal, round, and reactive to light.  Neck:     Thyroid: No thyromegaly.     Vascular: No JVD.     Trachea: No tracheal deviation.  Cardiovascular:     Rate and Rhythm: Normal rate and regular rhythm.     Heart sounds: No murmur heard. Pulmonary:     Effort: Pulmonary effort is normal. No respiratory distress.     Breath sounds: Normal breath sounds. No wheezing.  Abdominal:     General: Bowel sounds are normal. There is no distension.     Palpations: Abdomen is soft.     Tenderness: There is no abdominal tenderness.  Lymphadenopathy:     Cervical: No cervical adenopathy.   Skin:    General: Skin is warm and dry.  Neurological:     Mental Status: He is alert and oriented to person, place, and time.     Cranial Nerves: No cranial nerve deficit.     Labs reviewed: Basic Metabolic Panel: Recent Labs    05/30/22 0000 09/26/22 0000 01/30/23 0000  NA 140  --  142  K 4.1  --  4.1  CL 102  --  106  CO2 25*  --  24*  BUN 14  --  13  CREATININE 1.0  --  0.8  CALCIUM 9.9  --  9.0  TSH  --  3.05  --    Liver Function Tests: Recent Labs    01/30/23 0000  AST 32  ALT 48*  ALKPHOS 57  ALBUMIN 4.1   No results for input(s): "LIPASE", "AMYLASE" in the last 8760 hours. No results for input(s): "AMMONIA" in the last 8760 hours. CBC: Recent Labs    09/26/22 0000 01/30/23 0000  WBC 6.8 6.8  6.8  HGB 14.4 15.6  15.6  HCT 42 46  46  PLT 290 254  254   Lipid Panel: Recent Labs    09/26/22 0000  CHOL 138  HDL 38  LDLCALC 77  TRIG 117   Lab Results  Component Value Date   HGBA1C 6.0 01/30/2023    Procedures since last visit: No results found.  Assessment/Plan  1. Type 2 diabetes mellitus with hyperglycemia, without long-term current use of insulin (HCC) A1C not done yet, needed for review Reordered On metformin no s/e Recommend not walking barefoot Eye exam annually   2. Essential hypertension Controlled On Cozaar  3. Pure hypercholesterolemia Continue lipitor   4. Benign essential tremor Propranolol prn  5. Obesity (BMI 30.0-34.9) Losing weight, doing great Continue to increase physical activity and reduce carbs in your diet.   Labs/tests ordered:  * No order type specified * BMP A1C in am  Next appt:  4 months with Dr Chales Abrahams with labs before apt also lipid tsh CBC BMP B12   Total time :  time greater than 50% of total time spent doing pt counseling and coordination of care

## 2023-02-05 ENCOUNTER — Encounter: Payer: Self-pay | Admitting: Adult Health

## 2023-02-05 ENCOUNTER — Non-Acute Institutional Stay: Payer: Medicare Other | Admitting: Adult Health

## 2023-02-05 VITALS — BP 140/76 | HR 100 | Temp 97.8°F | Resp 17 | Ht 71.0 in | Wt 226.2 lb

## 2023-02-05 DIAGNOSIS — I4510 Unspecified right bundle-branch block: Secondary | ICD-10-CM | POA: Diagnosis not present

## 2023-02-05 DIAGNOSIS — G250 Essential tremor: Secondary | ICD-10-CM

## 2023-02-05 DIAGNOSIS — M25552 Pain in left hip: Secondary | ICD-10-CM | POA: Diagnosis not present

## 2023-02-05 DIAGNOSIS — E1165 Type 2 diabetes mellitus with hyperglycemia: Secondary | ICD-10-CM

## 2023-02-05 DIAGNOSIS — I10 Essential (primary) hypertension: Secondary | ICD-10-CM

## 2023-02-05 DIAGNOSIS — G8929 Other chronic pain: Secondary | ICD-10-CM

## 2023-02-05 NOTE — Patient Instructions (Signed)
Check home blood pressures three times a week for two weeks If consistently >140/90 let us know

## 2023-02-12 ENCOUNTER — Other Ambulatory Visit: Payer: Self-pay | Admitting: Internal Medicine

## 2023-03-05 ENCOUNTER — Encounter: Payer: Self-pay | Admitting: Adult Health

## 2023-03-05 ENCOUNTER — Non-Acute Institutional Stay: Payer: Medicare Other | Admitting: Adult Health

## 2023-03-05 VITALS — BP 138/82 | HR 73 | Temp 97.7°F | Resp 17 | Ht 71.0 in | Wt 229.0 lb

## 2023-03-05 DIAGNOSIS — Z0000 Encounter for general adult medical examination without abnormal findings: Secondary | ICD-10-CM

## 2023-03-05 NOTE — Progress Notes (Signed)
Subjective:   Colton Hart is a 77 y.o. male who presents for Medicare Annual/Subsequent preventive examination at [facility].   Visit Complete: In person  Patient Medicare AWV questionnaire was completed by the patient on 03/05/23; I have confirmed that all information answered by patient is correct and no changes since this date.  Cardiac Risk Factors include: advanced age (>50men, >41 women);diabetes mellitus;hypertension     Objective:    Today's Vitals   03/05/23 1239  BP: 138/82  Pulse: 73  Resp: 17  Temp: 97.7 F (36.5 C)  TempSrc: Temporal  SpO2: 97%  Weight: 229 lb (103.9 kg)  Height: 5\' 11"  (1.803 m)   Body mass index is 31.94 kg/m.     03/05/2023   12:39 PM 02/05/2023    1:43 PM 10/03/2022   10:43 AM 09/07/2022    9:01 AM 06/23/2022   11:06 AM 05/29/2022    1:29 PM 02/28/2022    8:22 AM  Advanced Directives  Does Patient Have a Medical Advance Directive? Yes Yes Yes Yes Yes Yes Yes  Type of Estate agent of [place];Living will;Out of facility DNR (pink MOST or yellow form) Healthcare Power of [place];Living will;Out of facility DNR (pink MOST or yellow form) Healthcare Power of [place];Living will;Out of facility DNR (pink MOST or yellow form) Healthcare Power of [place];Living will;Out of facility DNR (pink MOST or yellow form) Healthcare Power of [place];Living will Healthcare Power of [place];Living will;Out of facility DNR (pink MOST or yellow form) Healthcare Power of [place];Out of facility DNR (pink MOST or yellow form)  Does patient want to make changes to medical advance directive? No - Patient declined No - Patient declined No - Patient declined No - Patient declined  No - Patient declined No - Patient declined  Copy of Healthcare Power of Attorney in Chart? No - copy requested No - copy requested Yes - validated most recent copy scanned in chart (See row information) Yes - validated most recent  copy scanned in chart (See row information) No - copy requested  Yes - validated most recent copy scanned in chart (See row information)  Would patient like information on creating a medical advance directive?     No - Patient declined    Pre-existing out of facility DNR order (yellow form or pink MOST form) Pink MOST form placed in chart (order not valid for inpatient use);Yellow form placed in chart (order not valid for inpatient use) Pink MOST form placed in chart (order not valid for inpatient use);Yellow form placed in chart (order not valid for inpatient use)     Yellow form placed in chart (order not valid for inpatient use);Pink MOST form placed in chart (order not valid for inpatient use)    Current Medications (verified) Outpatient Encounter Medications as of 03/05/2023  Medication Sig   acetaminophen (TYLENOL) 500 MG tablet Take 500 mg by mouth at bedtime as needed for moderate pain.    atorvastatin (LIPITOR) 20 MG tablet Take 1 tablet (20 mg total) by mouth daily.   Cholecalciferol (VITAMIN D3) 2000 units TABS Take 1,000 Units by mouth every evening.   losartan (COZAAR) 100 MG tablet Take 1 tablet (100 mg total) by mouth daily.   metFORMIN (GLUCOPHAGE-XR) 500 MG 24 hr tablet TAKE 1 TABLET TWICE A DAY  WITH MEALS   metroNIDAZOLE (METROCREAM) 0.75 % cream Apply 1 Application topically as needed.   naproxen sodium (ALEVE) 220 MG tablet Take 220 mg by mouth daily  as needed (pain).   propranolol (INDERAL) 10 MG tablet Take 1 tablet (10 mg total) by mouth daily as needed (tremor).   No facility-administered encounter medications on file as of 03/05/2023.    Allergies (verified) Asa [aspirin]   History: Past Medical History:  Diagnosis Date   Allergic rhinitis    Arthritis    Cancer (HCC)    basal cell nose   Carotid atherosclerosis, bilateral    dvt  ?14   Cataract    Removal both eyes    DVT (deep venous thrombosis) (HCC) 11/22/2012   GERD (gastroesophageal reflux disease)     occ   High blood pressure    Hypercholesteremia    Hyperlipidemia    Left atrial dilatation    LVH (left ventricular hypertrophy)    Obesity    Seizures (HCC)    "in 20's from aspirin"   Past Surgical History:  Procedure Laterality Date   CATARACT EXTRACTION, BILATERAL     COLONOSCOPY  2015   LUMBAR LAMINECTOMY/DECOMPRESSION MICRODISCECTOMY Left 11/30/2016   Procedure: LEFT LUMBAR FOUR-FIVE LAMINECTOMY FOR FACET/SYNOVIAL CYST;  Surgeon: Coletta Memos, MD;  Location: [facility] OR;  Service: Neurosurgery;  Laterality: Left;  LAMINECTOMY FOR FACET/SYNOVIAL CYST LUMBAR 4-LUMBAR 5, LEFT   right leg fracture s/p surgical repair with screws Right 1966   SKIN CANCER EXCISION  2010   from his nose, no recurrence   TOTAL KNEE ARTHROPLASTY Right 2015   Family History  Problem Relation Age of Onset   Alzheimer's disease Mother    Cancer Mother        Skin   Alzheimer's disease Father    Cancer Father        colon, liver, skin   Colon polyps Father    Colon cancer Father    Esophageal cancer Neg Hx    Rectal cancer Neg Hx    Stomach cancer Neg Hx    Social History   Socioeconomic History   Marital status: Married    Spouse name: Not on file   Number of children: Not on file   Years of education: Not on file   Highest education level: Not on file  Occupational History   Not on file  Tobacco Use   Smoking status: Former    Current packs/day: 0.00    Average packs/day: 1 pack/day for 20.0 years (20.0 ttl pk-yrs)    Types: Cigarettes    Start date: 11/27/1968    Quit date: 11/27/1988    Years since quitting: 34.2   Smokeless tobacco: Never  Vaping Use   Vaping status: Never Used  Substance and Sexual Activity   Alcohol use: No   Drug use: No   Sexual activity: Not on file  Other Topics Concern   Not on file  Social History Narrative   Diet?        Do you drink/eat things with caffeine? Yes      Marital status?            Married                        What year were you  married? 2001      Do you live in a house, apartment, assisted living, condo, trailer, etc.? home      Is it one or more stories? no      How many persons live in your home? 2      Do you have any pets in your  home? (please list) no      Current or past profession: IT consultant      Do you exercise?      No                                Type & how often? Walk 1-2 miles, 1-2 times per week      Do you have a living will? No      Do you have a DNR form?    No                              If not, do you want to discuss one? Yes      Do you have signed POA/HPOA for forms?  Yes   Social Determinants of Health   Financial Resource Strain: Low Risk  (02/05/2018)   Overall Financial Resource Strain (CARDIA)    Difficulty of Paying Living Expenses: Not hard at all  Food Insecurity: No Food Insecurity (02/05/2018)   Hunger Vital Sign    Worried About Running Out of Food in the Last Year: Never true    Ran Out of Food in the Last Year: Never true  Transportation Needs: No Transportation Needs (02/05/2018)   PRAPARE - Administrator, Civil Service (Medical): No    Lack of Transportation (Non-Medical): No  Physical Activity: Sufficiently Active (02/05/2018)   Exercise Vital Sign    Days of Exercise per Week: 7 days    Minutes of Exercise per Session: 30 min  Stress: No Stress Concern Present (02/05/2018)   [employer] of Occupational Health - Occupational Stress Questionnaire    Feeling of Stress : Only a little  Social Connections: Somewhat Isolated (02/05/2018)   Social Connection and Isolation Panel [NHANES]    Frequency of Communication with Friends and Family: More than three times a week    Frequency of Social Gatherings with Friends and Family: More than three times a week    Attends Religious Services: Never    Database administrator or Organizations: No    Attends Engineer, structural: Never    Marital Status: Married    Tobacco  Counseling Counseling given: Not Answered   Clinical Intake:     Pain : No/denies pain     BMI - recorded: 31.94 Nutritional Status: BMI > 30  Obese Nutritional Risks: None Diabetes: Yes  How often do you need to have someone help you when you read instructions, pamphlets, or other written materials from your doctor or pharmacy?: 1 - Never What is the last grade level you completed in school?: grad school  Interpreter Needed?: No      Activities of Daily Living    03/05/2023    1:33 PM 03/05/2023    1:01 PM  In your present state of health, do you have any difficulty performing the following activities:  Hearing? 0 0  Vision? 1 0  Difficulty concentrating or making decisions? 0 0  Walking or climbing stairs? 0 0  Dressing or bathing? 0 0  Doing errands, shopping? 0 0  Preparing Food and eating ? N   Using the Toilet? N   In the past six months, have you accidently leaked urine? N   Do you have problems with loss of bowel control? N   Managing your Medications? N   Managing your Finances?  N   Housekeeping or managing your Housekeeping? N     Patient Care Team: Mahlon Gammon, MD as PCP - General (Internal Medicine)  Indicate any recent Medical Services you may have received from other than [facility] providers in the past year (date may be approximate).     Assessment:   This is a routine wellness examination for [place].  Hearing/Vision screen Hearing Screening - Comments:: In 2023 Vision Screening - Comments:: Yes in the last 12 months    Goals Addressed             This Visit's Progress    Maintain Mobility and Function       Evidence-based guidance:  Acknowledge and validate impact of pain, loss of strength and potential disfigurement (hand osteoarthritis) on mental health and daily life, such as social isolation, anxiety, depression, impaired sexual relationship and   injury from falls.  Anticipate referral to physical or occupational therapy for  assessment, therapeutic exercise and recommendation for adaptive equipment or assistive devices; encourage participation.  Assess impact on ability to perform activities of daily living, as well as engage in sports and leisure events or requirements of work or school.  Provide anticipatory guidance and reassurance about the benefit of exercise to maintain function; acknowledge and normalize fear that exercise may worsen symptoms.  Encourage regular exercise, at least 10 minutes at a time for 45 minutes per week; consider yoga, water exercise and proprioceptive exercises; encourage use of wearable activity tracker to increase motivation and adherence.  Encourage maintenance or resumption of daily activities, including employment, as pain allows and with minimal exposure to trauma.  Assist patient to advocate for adaptations to the work environment.  Consider level of pain and function, gender, age, lifestyle, patient preference, quality of life, readiness and ?ocapacity to benefit? when recommending patients for orthopaedic surgery consultation.  Explore strategies, such as changes to medication regimen or activity that enables patient to anticipate and manage flare-ups that increase deconditioning and disability.  Explore patient preferences; encourage exposure to a broader range of activities that have been avoided for fear of experiencing pain.  Identify barriers to participation in therapy or exercise, such as pain with activity, anticipated or imagined pain.  Monitor postoperative joint replacement or any preexisting joint replacement for ongoing pain and loss of function; provide social support and encouragement throughout recovery.   Notes:        Depression Screen    03/05/2023   12:59 PM 03/05/2023   12:39 PM 02/05/2023    1:42 PM 10/03/2022   10:42 AM 05/29/2022    1:28 PM 02/28/2022    8:20 AM 01/23/2022    1:28 PM  PHQ 2/9 Scores  PHQ - 2 Score 0 0 0 0 0 0 0    Fall Risk     03/05/2023   12:59 PM 03/05/2023   12:39 PM 02/05/2023    1:42 PM 10/03/2022   10:42 AM 09/07/2022    9:01 AM  Fall Risk   Falls in the past year? 0 0 0 0 0  Number falls in past yr: 0 0  0 0  Injury with Fall? 0 0 0 0 0  Risk for fall due to :  No Fall Risks No Fall Risks No Fall Risks No Fall Risks  Follow up  Falls evaluation completed Falls evaluation completed Falls evaluation completed Falls evaluation completed    MEDICARE RISK AT HOME: Medicare Risk at Home Any stairs in or around the home?: No  If so, are there any without handrails?: No Home free of loose throw rugs in walkways, pet beds, electrical cords, etc?: Yes Adequate lighting in your home to reduce risk of falls?: Yes Life alert?: No Use of a cane, walker or w/c?: No Grab bars in the bathroom?: Yes Shower chair or bench in shower?: No Elevated toilet seat or a handicapped toilet?: No  TIMED UP AND GO:  Was the test performed?  Yes  Length of time to ambulate 10 feet: 12 sec Gait slow and steady without use of assistive device    Cognitive Function:    03/05/2023    1:31 PM 03/05/2023    1:01 PM 02/05/2018    8:47 AM 01/09/2017   10:22 AM  MMSE - Mini Mental State Exam  Orientation to time 5 5 5 5   Orientation to Place 5 5 5 5   Registration 3 3 3 3   Attention/ Calculation 5 3 5 5   Recall 3 2 3 3   Language- name 2 objects 2 2 2 2   Language- repeat 1 1 1 1   Language- follow 3 step command 3 3 3 3   Language- read & follow direction 1 1 1 1   Write a sentence 1 1 1 1   Copy design 1 1 1 1   Total score 30 27 30 30         02/28/2022    8:22 AM 02/22/2021    9:22 AM 02/19/2020    8:39 AM 01/23/2019    1:16 PM  6CIT Screen  What Year? 0 points 0 points 0 points 0 points  What month? 0 points 0 points 0 points 0 points  What time? 0 points 0 points 0 points 0 points  Count back from 20 0 points 0 points 0 points 0 points  Months in reverse 0 points 0 points 0 points 0 points  Repeat phrase 0 points 0 points  0 points 2 points  Total Score 0 points 0 points 0 points 2 points    Immunizations Immunization History  Administered Date(s) Administered   Fluad Quad(high Dose 65+) 01/03/2022, 01/08/2023   Hep A / Hep B 04/03/2002   Influenza, High Dose Seasonal PF 01/09/2014, 01/07/2015, 01/09/2018, 01/10/2019, 01/30/2020   Influenza-Unspecified 02/18/2016, 01/22/2017, 01/21/2021   Moderna Covid-19 Fall Seasonal Vaccine 2yrs & older 07/26/2021, 02/01/2022, 07/27/2022   Moderna SARS-COV2 Booster Vaccination 09/02/2020   Moderna Sars-Covid-2 Vaccination 04/15/2019, 05/14/2019, 02/17/2020, 09/02/2020   Pneumococcal Conjugate,unspecified 09/30/2013   Pneumococcal Conjugate-13 09/19/2017   Pneumococcal Polysaccharide-23 09/30/2013   Pneumococcal-Unspecified 09/30/2013, 01/02/2015   Tdap 04/03/2013   Zoster Recombinant(Shingrix) 08/18/2017, 10/20/2017, 11/29/2017    TDAP status: Up to date  Flu Vaccine status: Up to date  Pneumococcal vaccine status: Due, Education has been provided regarding the importance of this vaccine. Advised may receive this vaccine at local pharmacy or Health Dept. Aware to provide a copy of the vaccination record if obtained from local pharmacy or Health Dept. Verbalized acceptance and understanding.  Covid-19 vaccine status: Completed vaccines  Qualifies for Shingles Vaccine? Yes   Zostavax completed Yes   Shingrix Completed?: Yes  Screening Tests Health Maintenance  Topic Date Due   DTaP/Tdap/Td (2 - Td or Tdap) 04/04/2023   HEMOGLOBIN A1C  07/31/2023   FOOT EXAM  10/03/2023   OPHTHALMOLOGY EXAM  12/08/2023   Diabetic kidney evaluation - eGFR measurement  01/30/2024   Diabetic kidney evaluation - Urine ACR  01/30/2024   Medicare Annual Wellness (AWV)  03/04/2024   Colonoscopy  05/27/2024  Pneumonia Vaccine 59+ Years old  Completed   INFLUENZA VACCINE  Completed   COVID-19 Vaccine  Completed   Hepatitis C Screening  Completed   Zoster Vaccines- Shingrix   Completed   HPV VACCINES  Aged Out    Health Maintenance  There are no preventive care reminders to display for this patient.   Colorectal cancer screening: Referral to GI placed Pt has seen Dr Adela Lank and had colon polyps removed in 2021. Told to return in 7 years. Pt aware the office will call re: appt.  Lung Cancer Screening: (Low Dose CT Chest recommended if Age 8-80 years, 20 pack-year currently smoking OR have quit w/in 15years.) does not qualify.  He quit smoking in the 1980s  Lung Cancer Screening Referral: NA  Additional Screening:  Hepatitis C Screening: does not qualify; Completed NA  Vision Screening: Recommended annual ophthalmology exams for early detection of glaucoma and other disorders of the eye. Is the patient up to date with their annual eye exam?  Yes  Who is the provider or what is the name of the office in which the patient attends annual eye exams? [facility] If pt is not established with a provider, would they like to be referred to a provider to establish care? No .   Dental Screening: Recommended annual dental exams for proper oral hygiene  Diabetic Foot Exam: Diabetic Foot Exam: Completed 10/2022  Community Resource Referral / Chronic Care Management: CRR required this visit?  No   CCM required this visit?  No     Plan:     I have personally reviewed and noted the following in the patient's chart:   Medical and social history Use of alcohol, tobacco or illicit drugs  Current medications and supplements including opioid prescriptions. Patient is not currently taking opioid prescriptions. Functional ability and status Nutritional status Physical activity Advanced directives List of other physicians Hospitalizations, surgeries, and ER visits in previous 12 months Vitals Screenings to include cognitive, depression, and falls Referrals and appointments  In addition, I have reviewed and discussed with patient certain preventive protocols,  quality metrics, and best practice recommendations. A written personalized care plan for preventive services as well as general preventive health recommendations were provided to patient.     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 NO symptoms. Recommend monitoring, report if dizziness or syncope.

## 2023-03-05 NOTE — Patient Instructions (Signed)
Colton Hart , Thank you for taking time to come for your Medicare Wellness Visit. I appreciate your ongoing commitment to your health goals. Please review the following plan we discussed and let me know if I can assist you in the future.   Screening recommendations/referrals: Colonoscopy Done in 2021 recommended in 7 years  Recommended yearly ophthalmology/optometry visit for glaucoma screening and checkup Recommended yearly dental visit for hygiene and checkup  Vaccinations: Influenza vaccine due annually in September/October Pneumococcal vaccine Recommend Prevnar 20 Tdap vaccine up to date  Shingles vaccine up to date     Advanced directives: reviewed   Conditions/risks identified: Cardiac risk, modify risk factors through diet, exercise, medication management.   Next appointment: 1 year.   Preventive Care 75 Years and Older, Male Preventive care refers to lifestyle choices and visits with your health care provider that can promote health and wellness. What does preventive care include? A yearly physical exam. This is also called an annual well check. Dental exams once or twice a year. Routine eye exams. Ask your health care provider how often you should have your eyes checked. Personal lifestyle choices, including: Daily care of your teeth and gums. Regular physical activity. Eating a healthy diet. Avoiding tobacco and drug use. Limiting alcohol use. Practicing safe sex. Taking low doses of aspirin every day. Taking vitamin and mineral supplements as recommended by your health care provider. What happens during an annual well check? The services and screenings done by your health care provider during your annual well check will depend on your age, overall health, lifestyle risk factors, and family history of disease. Counseling  Your health care provider may ask you questions about your: Alcohol use. Tobacco use. Drug use. Emotional well-being. Home and relationship  well-being. Sexual activity. Eating habits. History of falls. Memory and ability to understand (cognition). Work and work Astronomer. Screening  You may have the following tests or measurements: Height, weight, and BMI. Blood pressure. Lipid and cholesterol levels. These may be checked every 5 years, or more frequently if you are over 75 years old. Skin check. Lung cancer screening. You may have this screening every year starting at age 75 if you have a 30-pack-year history of smoking and currently smoke or have quit within the past 15 years. Fecal occult blood test (FOBT) of the stool. You may have this test every year starting at age 75. Flexible sigmoidoscopy or colonoscopy. You may have a sigmoidoscopy every 5 years or a colonoscopy every 10 years starting at age 75. Prostate cancer screening. Recommendations will vary depending on your family history and other risks. Hepatitis C blood test. Hepatitis B blood test. Sexually transmitted disease (STD) testing. Diabetes screening. This is done by checking your blood sugar (glucose) after you have not eaten for a while (fasting). You may have this done every 1-3 years. Abdominal aortic aneurysm (AAA) screening. You may need this if you are a current or former smoker. Osteoporosis. You may be screened starting at age 75 if you are at high risk. Talk with your health care provider about your test results, treatment options, and if necessary, the need for more tests. Vaccines  Your health care provider may recommend certain vaccines, such as: Influenza vaccine. This is recommended every year. Tetanus, diphtheria, and acellular pertussis (Tdap, Td) vaccine. You may need a Td booster every 10 years. Zoster vaccine. You may need this after age 75. Pneumococcal 13-valent conjugate (PCV13) vaccine. One dose is recommended after age 75. Pneumococcal polysaccharide (PPSV23) vaccine.  One dose is recommended after age 75. Talk to your health care  provider about which screenings and vaccines you need and how often you need them. This information is not intended to replace advice given to you by your health care provider. Make sure you discuss any questions you have with your health care provider. Document Released: 04/16/2015 Document Revised: 12/08/2015 Document Reviewed: 01/19/2015 Elsevier Interactive Patient Education  2017 [employer].  Fall Prevention in the Home Falls can cause injuries. They can happen to people of all ages. There are many things you can do to make your home safe and to help prevent falls. What can I do on the outside of my home? Regularly fix the edges of walkways and driveways and fix any cracks. Remove anything that might make you trip as you walk through a door, such as a raised step or threshold. Trim any bushes or trees on the path to your home. Use bright outdoor lighting. Clear any walking paths of anything that might make someone trip, such as rocks or tools. Regularly check to see if handrails are loose or broken. Make sure that both sides of any steps have handrails. Any raised decks and porches should have guardrails on the edges. Have any leaves, snow, or ice cleared regularly. Use sand or salt on walking paths during winter. Clean up any spills in your garage right away. This includes oil or grease spills. What can I do in the bathroom? Use night lights. Install grab bars by the toilet and in the tub and shower. Do not use towel bars as grab bars. Use non-skid mats or decals in the tub or shower. If you need to sit down in the shower, use a plastic, non-slip stool. Keep the floor dry. Clean up any water that spills on the floor as soon as it happens. Remove soap buildup in the tub or shower regularly. Attach bath mats securely with double-sided non-slip rug tape. Do not have throw rugs and other things on the floor that can make you trip. What can I do in the bedroom? Use night lights. Make  sure that you have a light by your bed that is easy to reach. Do not use any sheets or blankets that are too big for your bed. They should not hang down onto the floor. Have a firm chair that has side arms. You can use this for support while you get dressed. Do not have throw rugs and other things on the floor that can make you trip. What can I do in the kitchen? Clean up any spills right away. Avoid walking on wet floors. Keep items that you use a lot in easy-to-reach places. If you need to reach something above you, use a strong step stool that has a grab bar. Keep electrical cords out of the way. Do not use floor polish or wax that makes floors slippery. If you must use wax, use non-skid floor wax. Do not have throw rugs and other things on the floor that can make you trip. What can I do with my stairs? Do not leave any items on the stairs. Make sure that there are handrails on both sides of the stairs and use them. Fix handrails that are broken or loose. Make sure that handrails are as long as the stairways. Check any carpeting to make sure that it is firmly attached to the stairs. Fix any carpet that is loose or worn. Avoid having throw rugs at the top or bottom of  the stairs. If you do have throw rugs, attach them to the floor with carpet tape. Make sure that you have a light switch at the top of the stairs and the bottom of the stairs. If you do not have them, ask someone to add them for you. What else can I do to help prevent falls? Wear shoes that: Do not have high heels. Have rubber bottoms. Are comfortable and fit you well. Are closed at the toe. Do not wear sandals. If you use a stepladder: Make sure that it is fully opened. Do not climb a closed stepladder. Make sure that both sides of the stepladder are locked into place. Ask someone to hold it for you, if possible. Clearly mark and make sure that you can see: Any grab bars or handrails. First and last steps. Where the  edge of each step is. Use tools that help you move around (mobility aids) if they are needed. These include: Canes. Walkers. Scooters. Crutches. Turn on the lights when you go into a dark area. Replace any light bulbs as soon as they burn out. Set up your furniture so you have a clear path. Avoid moving your furniture around. If any of your floors are uneven, fix them. If there are any pets around you, be aware of where they are. Review your medicines with your doctor. Some medicines can make you feel dizzy. This can increase your chance of falling. Ask your doctor what other things that you can do to help prevent falls. This information is not intended to replace advice given to you by your health care provider. Make sure you discuss any questions you have with your health care provider. Document Released: 01/14/2009 Document Revised: 08/26/2015 Document Reviewed: 04/24/2014 Elsevier Interactive Patient Education  2017 [employer].

## 2023-04-18 ENCOUNTER — Other Ambulatory Visit: Payer: Self-pay

## 2023-04-18 DIAGNOSIS — E7800 Pure hypercholesterolemia, unspecified: Secondary | ICD-10-CM

## 2023-04-18 MED ORDER — ATORVASTATIN CALCIUM 20 MG PO TABS: 20.0000 mg | ORAL_TABLET | Freq: Every day | ORAL | 1 refills | Status: DC

## 2023-04-26 ENCOUNTER — Encounter: Payer: Self-pay | Admitting: Internal Medicine

## 2023-04-26 DIAGNOSIS — I10 Essential (primary) hypertension: Secondary | ICD-10-CM

## 2023-04-26 DIAGNOSIS — E7800 Pure hypercholesterolemia, unspecified: Secondary | ICD-10-CM

## 2023-04-26 MED ORDER — METFORMIN HCL ER 500 MG PO TB24: 500.0000 mg | ORAL_TABLET | Freq: Two times a day (BID) | ORAL | 1 refills | Status: DC

## 2023-04-26 MED ORDER — ATORVASTATIN CALCIUM 20 MG PO TABS: 20.0000 mg | ORAL_TABLET | Freq: Every day | ORAL | 1 refills | Status: DC

## 2023-04-26 MED ORDER — LOSARTAN POTASSIUM 100 MG PO TABS: 100.0000 mg | ORAL_TABLET | Freq: Every day | ORAL | 1 refills | Status: DC

## 2023-05-29 LAB — BASIC METABOLIC PANEL
BUN: 15 (ref 4–21)
CO2: 19 (ref 13–22)
Chloride: 104 (ref 99–108)
Creatinine: 0.9 (ref 0.6–1.3)
Glucose: 144
Potassium: 4 meq/L (ref 3.5–5.1)
Sodium: 139 (ref 137–147)

## 2023-05-29 LAB — COMPREHENSIVE METABOLIC PANEL
Calcium: 9.2 (ref 8.7–10.7)
eGFR: 89

## 2023-05-29 LAB — LAB REPORT - SCANNED
A1c: 6.3
EGFR: 89

## 2023-06-05 ENCOUNTER — Encounter: Payer: Self-pay | Admitting: Internal Medicine

## 2023-06-05 ENCOUNTER — Non-Acute Institutional Stay: Payer: Medicare Other | Admitting: Internal Medicine

## 2023-06-05 VITALS — BP 136/78 | HR 88 | Temp 98.0°F | Resp 18 | Ht 71.0 in | Wt 234.4 lb

## 2023-06-05 DIAGNOSIS — I10 Essential (primary) hypertension: Secondary | ICD-10-CM | POA: Diagnosis not present

## 2023-06-05 DIAGNOSIS — E1165 Type 2 diabetes mellitus with hyperglycemia: Secondary | ICD-10-CM | POA: Diagnosis not present

## 2023-06-05 DIAGNOSIS — M1612 Unilateral primary osteoarthritis, left hip: Secondary | ICD-10-CM

## 2023-06-05 DIAGNOSIS — G250 Essential tremor: Secondary | ICD-10-CM

## 2023-06-05 NOTE — Progress Notes (Signed)
 Location:  [facility] features editor of Service:  Clinic (12)  Provider:   Code Status:  Goals of Care:     06/05/2023   12:59 PM  Advanced Directives  Does Patient Have a Medical Advance Directive? Yes  Type of Advance Directive Out of facility DNR (pink MOST or yellow form);Living will;Healthcare Power of Attorney  Does patient want to make changes to medical advance directive? No - Patient declined  Copy of Healthcare Power of Attorney in Chart? No - copy requested     Chief Complaint  Patient presents with   Medical Management of Chronic Issues    4 month follow up. Discuss the need for Tdap.    HPI: Patient is a 78 y.o. male seen today for medical management of chronic diseases.    Discussed the use of AI scribe software for clinical note transcription with the patient, who gave verbal consent to proceed.  History of Present Illness   Left Hip Pain The patient, with a history of sciatica, presents with left hip pain that has been worsening over the past few weeks. The pain intensifies with walking and has been particularly severe today after a walk from his home to the clinic. The patient has previously received cortisone injections for hip pain, which provided some relief. However, the patient is concerned about the possibility of needing a hip replacement due to his age and wants to address this sooner rather than later. The patient denies any recent falls or injuries to the hip. Diabetes Type 2 In addition to the hip pain, the patient has a history of diabetes, which is managed with metformin. The patient reports no side effects from the medication and is generally compliant with the regimen. The patient's A1c has slightly increased, but remains within the pre-diabetic range. The patient admits to not strictly monitoring his diet, but avoids desserts and consumes a lot of fruit.  Essential tremors Takes Propanolol Hypertension BPH The patient is also  currently taking psychology classes for personal interest      Past Medical History:  Diagnosis Date   Allergic rhinitis    Arthritis    Cancer (HCC)    basal cell nose   Carotid atherosclerosis, bilateral    dvt  ?14   Cataract    Removal both eyes    DVT (deep venous thrombosis) (HCC) 11/22/2012   GERD (gastroesophageal reflux disease)    occ   High blood pressure    Hypercholesteremia    Hyperlipidemia    Left atrial dilatation    LVH (left ventricular hypertrophy)    Obesity    Seizures (HCC)    "in 20's from aspirin"    Past Surgical History:  Procedure Laterality Date   CATARACT EXTRACTION, BILATERAL     COLONOSCOPY  2015   LUMBAR LAMINECTOMY/DECOMPRESSION MICRODISCECTOMY Left 11/30/2016   Procedure: LEFT LUMBAR FOUR-FIVE LAMINECTOMY FOR FACET/SYNOVIAL CYST;  Surgeon: Coletta Memos, MD;  Location: [facility] OR;  Service: Neurosurgery;  Laterality: Left;  LAMINECTOMY FOR FACET/SYNOVIAL CYST LUMBAR 4-LUMBAR 5, LEFT   right leg fracture s/p surgical repair with screws Right 1966   SKIN CANCER EXCISION  2010   from his nose, no recurrence   TOTAL KNEE ARTHROPLASTY Right 2015    Allergies  Allergen Reactions   Asa [Aspirin] Other (See Comments)    Seizures     Outpatient Encounter Medications as of 06/05/2023  Medication Sig   acetaminophen (TYLENOL) 500 MG tablet Take 500 mg by mouth at  bedtime as needed for moderate pain.    atorvastatin (LIPITOR) 20 MG tablet Take 1 tablet (20 mg total) by mouth daily.   Cholecalciferol (VITAMIN D3) 2000 units TABS Take 1,000 Units by mouth every evening.   losartan (COZAAR) 100 MG tablet Take 1 tablet (100 mg total) by mouth daily.   metFORMIN (GLUCOPHAGE-XR) 500 MG 24 hr tablet Take 1 tablet (500 mg total) by mouth 2 (two) times daily with a meal.   metroNIDAZOLE (METROCREAM) 0.75 % cream Apply 1 Application topically as needed.   naproxen sodium (ALEVE) 220 MG tablet Take 220 mg by mouth daily as needed (pain).   propranolol (INDERAL)  10 MG tablet Take 1 tablet (10 mg total) by mouth daily as needed (tremor).   No facility-administered encounter medications on file as of 06/05/2023.    Review of Systems:  Review of Systems  Constitutional:  Negative for activity change, appetite change and unexpected weight change.  HENT: Negative.    Respiratory:  Negative for cough and shortness of breath.   Cardiovascular:  Negative for leg swelling.  Gastrointestinal:  Negative for constipation.  Genitourinary:  Negative for frequency.  Musculoskeletal:  Positive for arthralgias. Negative for gait problem and myalgias.  Skin: Negative.  Negative for rash.  Neurological:  Negative for dizziness and weakness.  Psychiatric/Behavioral:  Negative for confusion and sleep disturbance.   All other systems reviewed and are negative.   Health Maintenance  Topic Date Due   DTaP/Tdap/Td (2 - Td or Tdap) 04/04/2023   COVID-19 Vaccine (9 - 2024-25 season) 06/21/2023 (Originally 12/03/2022)   FOOT EXAM  10/03/2023   HEMOGLOBIN A1C  11/26/2023   OPHTHALMOLOGY EXAM  12/08/2023   Diabetic kidney evaluation - Urine ACR  01/30/2024   Medicare Annual Wellness (AWV)  03/04/2024   Colonoscopy  05/27/2024   Diabetic kidney evaluation - eGFR measurement  05/28/2024   Pneumonia Vaccine 29+ Years old  Completed   INFLUENZA VACCINE  Completed   Hepatitis C Screening  Completed   Zoster Vaccines- Shingrix  Completed   HPV VACCINES  Aged Out    Physical Exam: Vitals:   06/05/23 0855  BP: 136/78  Pulse: 88  Resp: 18  Temp: 98 F (36.7 C)  TempSrc: Temporal  SpO2: 93%  Weight: 234 lb 6.4 oz (106.3 kg)  Height: 5\' 11"  (1.803 m)   Body mass index is 32.69 kg/m. Physical Exam Vitals reviewed.  Constitutional:      Appearance: Normal appearance.  HENT:     Head: Normocephalic.     Nose: Nose normal.     Mouth/Throat:     Mouth: Mucous membranes are moist.     Pharynx: Oropharynx is clear.  Eyes:     Pupils: Pupils are equal, round, and  reactive to light.  Cardiovascular:     Rate and Rhythm: Normal rate and regular rhythm.     Pulses: Normal pulses.     Heart sounds: No murmur heard. Pulmonary:     Effort: Pulmonary effort is normal. No respiratory distress.     Breath sounds: Normal breath sounds. No rales.  Abdominal:     General: Abdomen is flat. Bowel sounds are normal.     Palpations: Abdomen is soft.  Musculoskeletal:        General: No swelling.     Cervical back: Neck supple.     Comments: Hip Exam was normal  Skin:    General: Skin is warm.  Neurological:     General: No focal  deficit present.     Mental Status: He is alert and oriented to person, place, and time.  Psychiatric:        Mood and Affect: Mood normal.        Thought Content: Thought content normal.     Labs reviewed: Basic Metabolic Panel: Recent Labs    09/26/22 0000 01/30/23 0000 05/29/23 0000  NA  --  142 139  K  --  4.1 4.0  CL  --  106 104  CO2  --  24* 19  BUN  --  13 15  CREATININE  --  0.8 0.9  CALCIUM  --  9.0 9.2  TSH 3.05  --   --    Liver Function Tests: Recent Labs    01/30/23 0000  AST 32  ALT 48*  ALKPHOS 57  ALBUMIN 4.1   No results for input(s): "LIPASE", "AMYLASE" in the last 8760 hours. No results for input(s): "AMMONIA" in the last 8760 hours. CBC: Recent Labs    09/26/22 0000 01/30/23 0000  WBC 6.8 6.8  6.8  HGB 14.4 15.6  15.6  HCT 42 46  46  PLT 290 254  254   Lipid Panel: Recent Labs    09/26/22 0000  CHOL 138  HDL 38  LDLCALC 77  TRIG 117   Lab Results  Component Value Date   HGBA1C 6.0 01/30/2023    Procedures since last visit: No results found.  Assessment/Plan 1. Primary osteoarthritis of left hip (Primary) Wants to discuss Arthroplasty - Ambulatory referral to Orthopedic Surgery  2. Essential hypertension, benign Controlled on Cozaar  3. Type 2 diabetes mellitus with hyperglycemia, without long-term current use of insulin (HCC) Metformin Annual  Opthalmology  4. Benign essential tremor Propanolol PRn          Labs/tests ordered:  Labs Before visit Next appt:  Visit date not found

## 2023-06-05 NOTE — Patient Instructions (Addendum)
 Dr Raylene Everts

## 2023-10-08 ENCOUNTER — Encounter: Payer: Self-pay | Admitting: Adult Health

## 2023-10-08 ENCOUNTER — Non-Acute Institutional Stay: Admitting: Adult Health

## 2023-10-08 DIAGNOSIS — E1165 Type 2 diabetes mellitus with hyperglycemia: Secondary | ICD-10-CM | POA: Diagnosis not present

## 2023-10-08 NOTE — Patient Instructions (Signed)
 Please have your labs done on Thursday I will reach out to the clinic nurse and she should contact you  I recommend a Tdap vaccine

## 2023-10-08 NOTE — Progress Notes (Signed)
 Location:  [facility]  POS: Clinic  Provider: Tawni America, ANP  Goals of Care:     06/05/2023   12:59 PM  Advanced Directives  Does Patient Have a Medical Advance Directive? Yes  Type of Advance Directive Out of facility DNR (pink MOST or yellow form);Living will;Healthcare Power of Attorney  Does patient want to make changes to medical advance directive? No - Patient declined  Copy of Healthcare Power of Attorney in Chart? No - copy requested     Chief Complaint  Patient presents with   Follow-up    4 month follow up     History of Present Illness   Colton Hart is a 78 year old male with diabetes who presents for a follow-up visit. Hx of BCC with cryotherapy, seizure when taking asa, DVT, carotid atherosclerosis, obesity, LVH  Married with 2 children from prior marriage lives in [place]  He is currently taking metformin  without side effects such as diarrhea. He limits himself to one dessert a week to manage his weight, which has decreased from 234 pounds in March to 228 pounds currently, although this was not deliberate. He does not engage in regular physical activity. No numbness or tingling in his feet, although the bottom of his feet is somewhat numb. He has dermatitis on his foot for which he uses a mild steroid cream as prescribed by a podiatrist.  He has a history of hip pain and was referred to an orthopedist who diagnosed him with bursitis. He manages the pain with Tylenol  and rarely uses Aleve, about half a dozen times a year. He has a history of knee joint replacement and arthritis in all joints. After his ortho apt he was pleased to learn that he would not need a hip replacement.   He is taking propranolol  for tremor but uses it rarely due to it causing dryness. He is also on Lipitor for cholesterol management and losartan  for blood pressure control.  He has not received a TDAP vaccine recently and is overdue for it. He visits the eye doctor annually and uses a  podiatrist for foot care.      Wt Readings from Last 3 Encounters:  10/08/23 228 lb 12.8 oz (103.8 kg)  06/05/23 234 lb 6.4 oz (106.3 kg)  03/05/23 229 lb (103.9 kg)     EKG 12 lead 03/05/23: with 1st degree AVB possible left anterior hemiblock  Past Medical History:  Diagnosis Date   Allergic rhinitis    Arthritis    Cancer (HCC)    basal cell nose   Carotid atherosclerosis, bilateral    dvt  ?14   Cataract    Removal both eyes    DVT (deep venous thrombosis) (HCC) 11/22/2012   GERD (gastroesophageal reflux disease)    occ   High blood pressure    Hypercholesteremia    Hyperlipidemia    Left atrial dilatation    LVH (left ventricular hypertrophy)    Obesity    Seizures (HCC)    in 20's from aspirin    Past Surgical History:  Procedure Laterality Date   CATARACT EXTRACTION, BILATERAL     COLONOSCOPY  2015   LUMBAR LAMINECTOMY/DECOMPRESSION MICRODISCECTOMY Left 11/30/2016   Procedure: LEFT LUMBAR FOUR-FIVE LAMINECTOMY FOR FACET/SYNOVIAL CYST;  Surgeon: Gillie Duncans, MD;  Location: [facility] OR;  Service: Neurosurgery;  Laterality: Left;  LAMINECTOMY FOR FACET/SYNOVIAL CYST LUMBAR 4-LUMBAR 5, LEFT   right leg fracture s/p surgical repair with screws Right 1966   SKIN CANCER  EXCISION  2010   from his nose, no recurrence   TOTAL KNEE ARTHROPLASTY Right 2015    Allergies  Allergen Reactions   Asa [Aspirin] Other (See Comments)    Seizures     Outpatient Encounter Medications as of 10/08/2023  Medication Sig   acetaminophen  (TYLENOL ) 500 MG tablet Take 500 mg by mouth at bedtime as needed for moderate pain.    atorvastatin  (LIPITOR) 20 MG tablet Take 1 tablet (20 mg total) by mouth daily.   Cholecalciferol  (VITAMIN D3) 2000 units TABS Take 1,000 Units by mouth every evening.   losartan  (COZAAR ) 100 MG tablet Take 1 tablet (100 mg total) by mouth daily.   metFORMIN  (GLUCOPHAGE -XR) 500 MG 24 hr tablet Take 1 tablet (500 mg total) by mouth 2 (two) times daily with a meal.    metroNIDAZOLE (METROCREAM) 0.75 % cream Apply 1 Application topically as needed.   naproxen sodium (ALEVE) 220 MG tablet Take 220 mg by mouth daily as needed (pain).   propranolol  (INDERAL ) 10 MG tablet Take 1 tablet (10 mg total) by mouth daily as needed (tremor).   No facility-administered encounter medications on file as of 10/08/2023.    Review of Systems:  Review of Systems  Constitutional:  Negative for activity change, appetite change, chills, diaphoresis, fatigue, fever and unexpected weight change.  Respiratory:  Negative for cough, shortness of breath, wheezing and stridor.   Cardiovascular:  Negative for chest pain, palpitations and leg swelling.  Gastrointestinal:  Negative for abdominal distention, abdominal pain, constipation and diarrhea.  Genitourinary:  Negative for difficulty urinating and dysuria.  Musculoskeletal:  Negative for arthralgias, back pain, gait problem, joint swelling and myalgias.  Neurological:  Negative for dizziness, seizures, syncope, facial asymmetry, speech difficulty, weakness and headaches.  Hematological:  Negative for adenopathy. Does not bruise/bleed easily.  Psychiatric/Behavioral:  Negative for agitation, behavioral problems and confusion.     Health Maintenance  Topic Date Due   Diabetic kidney evaluation - Urine ACR  Never done   Hepatitis B Vaccines (2 of 3 - 19+ 3-dose series) 05/01/2002   DTaP/Tdap/Td (2 - Td or Tdap) 04/04/2023   COVID-19 Vaccine (9 - 2024-25 season) 02/29/2024 (Originally 12/03/2022)   INFLUENZA VACCINE  11/02/2023   HEMOGLOBIN A1C  11/26/2023   OPHTHALMOLOGY EXAM  12/08/2023   Medicare Annual Wellness (AWV)  03/04/2024   Colonoscopy  05/27/2024   Diabetic kidney evaluation - eGFR measurement  05/28/2024   FOOT EXAM  10/07/2024   Pneumococcal Vaccine: 50+ Years  Completed   Hepatitis C Screening  Completed   Zoster Vaccines- Shingrix  Completed   HPV VACCINES  Aged Out   Meningococcal B Vaccine  Aged Out     Physical Exam: Vitals:   10/08/23 1306  BP: 136/78  Pulse: 85  Temp: 97.6 F (36.4 C)  SpO2: 96%  Weight: 228 lb 12.8 oz (103.8 kg)  Height: 5' 11 (1.803 m)   Body mass index is 31.91 kg/m. Physical Exam Vitals and nursing note reviewed.  Constitutional:      Appearance: Normal appearance.  HENT:     Head: Normocephalic and atraumatic.     Right Ear: Tympanic membrane normal.     Left Ear: Tympanic membrane normal.     Nose: Nose normal.     Mouth/Throat:     Mouth: Mucous membranes are moist.     Pharynx: Oropharynx is clear.  Eyes:     Conjunctiva/sclera: Conjunctivae normal.     Pupils: Pupils are equal, round, and  reactive to light.  Cardiovascular:     Rate and Rhythm: Normal rate and regular rhythm.     Pulses:          Dorsalis pedis pulses are 2+ on the left side.       Posterior tibial pulses are 2+ on the right side and 2+ on the left side.     Heart sounds: No murmur heard. Pulmonary:     Effort: Pulmonary effort is normal. No respiratory distress.     Breath sounds: Normal breath sounds. No wheezing.  Abdominal:     General: Bowel sounds are normal. There is no distension.     Palpations: Abdomen is soft.     Tenderness: There is no abdominal tenderness.  Musculoskeletal:     Cervical back: Normal range of motion. No rigidity.     Right lower leg: No edema.     Left lower leg: No edema.     Right foot: Normal range of motion. No deformity.     Left foot: Normal range of motion. No deformity.  Feet:     Left foot:     Skin integrity: Skin integrity normal.  Lymphadenopathy:     Cervical: No cervical adenopathy.  Skin:    General: Skin is warm and dry.  Neurological:     General: No focal deficit present.     Mental Status: He is alert and oriented to person, place, and time. Mental status is at baseline.  Psychiatric:        Mood and Affect: Mood normal.     Labs reviewed: Basic Metabolic Panel: Recent Labs    01/30/23 0000  05/29/23 0000  NA 142 139  K 4.1 4.0  CL 106 104  CO2 24* 19  BUN 13 15  CREATININE 0.8 0.9  CALCIUM  9.0 9.2   Liver Function Tests: Recent Labs    01/30/23 0000  AST 32  ALT 48*  ALKPHOS 57  ALBUMIN  4.1   No results for input(s): LIPASE, AMYLASE in the last 8760 hours. No results for input(s): AMMONIA in the last 8760 hours. CBC: Recent Labs    01/30/23 0000  WBC 6.8  6.8  HGB 15.6  15.6  HCT 46  46  PLT 254  254   Lipid Panel: No results for input(s): CHOL, HDL, LDLCALC, TRIG, CHOLHDL, LDLDIRECT in the last 8760 hours. Lab Results  Component Value Date   HGBA1C 6.0 01/30/2023    Procedures since last visit: No results found.  Assessment/Plan Assessment and Plan    Diabetes Mellitus Type 2 Chronic condition managed with metformin . A1c previously 6.3.  - Order A1c - Continue metformin . Interest in Ozempic  for weight loss. Discussed potential side effects and insurance coverage. Concerns about compounding pharmacies noted. - Check insurance coverage for Ozempic . - Consider starting Ozempic  if covered. - Schedule follow-up in four weeks if Ozempic  is started.  Hip Bursitis Pain managed with Tylenol . Avoid frequent Aleve use due to kidney concerns. - Manage pain with Tylenol  as needed. - Avoid frequent use of Aleve.  Hypertension Blood pressure 136/78, managed with losartan .  Hyperlipidemia LDL last recorded at 77, managed with Lipitor. - Order lipid panel  Venous Insufficiency Dermatitis and discoloration on foot. Advised lifestyle modifications and monitoring for swelling. - Elevate feet. - Monitor for swelling. - Advise to wear compression hose if swelling occurs. - Advise to watch salt intake.  Essential Tremor Managed with propranolol , taken rarely due to side effects.  General Health Maintenance Due  for Tdap. Discussed flu and COVID vaccinations for fall. Regular eye and podiatrist visits reported. - Recommend  Tdap - Plan for flu and COVID vaccinations in the fall.  Follow-up Discussed need for earlier follow-up if Ozempic  is started. - Schedule four-month follow-up appointment. - Schedule earlier follow-up if Ozempic  is started.          Labs/tests ordered:  * No order type specified * CBC CMP Urine lipid A1C Thursday 7/10 Also ordered labs prior to next apt in Nov BMP A1C urine micro  Next appt:  Visit date not found   Total time :  time greater than 50% of total time spent doing pt counseling and coordination of care

## 2023-10-09 MED ORDER — OZEMPIC (0.25 OR 0.5 MG/DOSE) 2 MG/1.5ML ~~LOC~~ SOPN: 0.2500 mg | PEN_INJECTOR | SUBCUTANEOUS | 0 refills | Status: DC

## 2023-10-11 LAB — COMPREHENSIVE METABOLIC PANEL WITH GFR
Albumin: 4.4 (ref 3.5–5.0)
Calcium: 9.3 (ref 8.7–10.7)
Globulin: 2.3
eGFR: 77

## 2023-10-11 LAB — BASIC METABOLIC PANEL WITH GFR
BUN: 13 (ref 4–21)
CO2: 20 (ref 13–22)
Chloride: 105 (ref 99–108)
Creatinine: 1 (ref 0.6–1.3)
Glucose: 118
Potassium: 4.3 meq/L (ref 3.5–5.1)
Sodium: 142 (ref 137–147)

## 2023-10-11 LAB — LIPID PANEL
Cholesterol: 138 (ref 0–200)
HDL: 31 — AB (ref 35–70)
LDL Cholesterol: 83
Triglycerides: 117 (ref 40–160)

## 2023-10-11 LAB — CBC: RBC: 4.7 (ref 3.87–5.11)

## 2023-10-11 LAB — HEMOGLOBIN A1C: Hemoglobin A1C: 6

## 2023-10-11 LAB — CBC AND DIFFERENTIAL
HCT: 47 (ref 41–53)
Hemoglobin: 15.6 (ref 13.5–17.5)
Platelets: 293 K/uL (ref 150–400)
WBC: 7.3

## 2023-10-11 LAB — HEPATIC FUNCTION PANEL
ALT: 53 U/L — AB (ref 10–40)
AST: 37 (ref 14–40)
Alkaline Phosphatase: 64 (ref 25–125)
Bilirubin, Total: 0.4

## 2023-10-15 ENCOUNTER — Encounter: Payer: Self-pay | Admitting: Internal Medicine

## 2023-10-31 ENCOUNTER — Other Ambulatory Visit: Payer: Self-pay | Admitting: Internal Medicine

## 2023-10-31 DIAGNOSIS — E7800 Pure hypercholesterolemia, unspecified: Secondary | ICD-10-CM

## 2023-11-01 ENCOUNTER — Telehealth: Payer: Self-pay

## 2023-11-01 NOTE — Telephone Encounter (Signed)
 Patient called back & scheduled for 11/12/23 with Dr. Darlean.

## 2023-11-01 NOTE — Telephone Encounter (Signed)
 Copied from CRM [id number]. Topic: Appointments - Appointment Scheduling >> Nov 01, 2023  9:30 AM Diannia H wrote: Patient/patient representative is calling to schedule an appointment for a 1 month follow up for his ozempic . The problem is the providers next appointment is 08/11 and he is due for another injection on 08/07. Would it be to late for the appointment on 08/11? Could you please assist? Patients callback number is [phone].

## 2023-11-12 ENCOUNTER — Encounter: Payer: Self-pay | Admitting: Adult Health

## 2023-11-12 ENCOUNTER — Ambulatory Visit: Admitting: Adult Health

## 2023-11-12 VITALS — BP 130/76 | HR 91 | Temp 97.2°F | Ht 71.0 in | Wt 224.2 lb

## 2023-11-12 DIAGNOSIS — E66811 Obesity, class 1: Secondary | ICD-10-CM | POA: Diagnosis not present

## 2023-11-12 DIAGNOSIS — E1165 Type 2 diabetes mellitus with hyperglycemia: Secondary | ICD-10-CM

## 2023-11-12 DIAGNOSIS — I10 Essential (primary) hypertension: Secondary | ICD-10-CM | POA: Diagnosis not present

## 2023-11-12 MED ORDER — OZEMPIC (0.25 OR 0.5 MG/DOSE) 2 MG/1.5ML ~~LOC~~ SOPN: 0.2500 mg | PEN_INJECTOR | SUBCUTANEOUS | 2 refills | Status: AC

## 2023-11-12 NOTE — Progress Notes (Signed)
 Location:  [facility]  POS: Clinic  Provider: Tawni America, ANP   Goals of Care:     06/05/2023   12:59 PM  Advanced Directives  Does Patient Have a Medical Advance Directive? Yes  Type of Advance Directive Out of facility DNR (pink MOST or yellow form);Living will;Healthcare Power of Attorney  Does patient want to make changes to medical advance directive? No - Patient declined  Copy of Healthcare Power of Attorney in Chart? No - copy requested     Chief Complaint  Patient presents with   Medical Management of Chronic Issues    1 month follow up after starting ozempic .     HPI: Discussed the use of AI scribe software for clinical note transcription with the patient, who gave verbal consent to proceed.  History of Present Illness    History of Present Illness   Colton Hart is a 78 year old male with diabetes who presents for follow-up regarding weight loss and diabetes management.  Weight loss and appetite changes - 10 pound weight loss since March - Current BMI is 31.27 - Satisfaction with current weight - Decreased appetite - Eating smaller meals  Gastrointestinal symptoms - Hiccups and increased burping after meals, non-bothersome - No constipation, nausea, weakness, or fatigue  Diabetes management - Current A1c is 6.0 as of July - On metformin  and Ozempic  0.25 mg weekly - Follow-up laboratory appointment scheduled for November to monitor kidney function and other parameters - Previous laboratory results: BUN 13, creatinine 1.0         Past Medical History:  Diagnosis Date   Allergic rhinitis    Arthritis    Cancer (HCC)    basal cell nose   Carotid atherosclerosis, bilateral    dvt  ?14   Cataract    Removal both eyes    DVT (deep venous thrombosis) (HCC) 11/22/2012   GERD (gastroesophageal reflux disease)    occ   High blood pressure    Hypercholesteremia    Hyperlipidemia    Left atrial dilatation    LVH (left ventricular hypertrophy)     Obesity    Seizures (HCC)    in 20's from aspirin    Past Surgical History:  Procedure Laterality Date   CATARACT EXTRACTION, BILATERAL     COLONOSCOPY  2015   LUMBAR LAMINECTOMY/DECOMPRESSION MICRODISCECTOMY Left 11/30/2016   Procedure: LEFT LUMBAR FOUR-FIVE LAMINECTOMY FOR FACET/SYNOVIAL CYST;  Surgeon: Gillie Duncans, MD;  Location: [facility] OR;  Service: Neurosurgery;  Laterality: Left;  LAMINECTOMY FOR FACET/SYNOVIAL CYST LUMBAR 4-LUMBAR 5, LEFT   right leg fracture s/p surgical repair with screws Right 1966   SKIN CANCER EXCISION  2010   from his nose, no recurrence   TOTAL KNEE ARTHROPLASTY Right 2015    Allergies  Allergen Reactions   Asa [Aspirin] Other (See Comments)    Seizures     Outpatient Encounter Medications as of 11/12/2023  Medication Sig   acetaminophen  (TYLENOL ) 500 MG tablet Take 500 mg by mouth at bedtime as needed for moderate pain.    atorvastatin  (LIPITOR) 20 MG tablet TAKE 1 TABLET DAILY   Cholecalciferol  (VITAMIN D3) 2000 units TABS Take 1,000 Units by mouth every evening.   losartan  (COZAAR ) 100 MG tablet Take 1 tablet (100 mg total) by mouth daily.   metFORMIN  (GLUCOPHAGE -XR) 500 MG 24 hr tablet TAKE 1 TABLET TWICE A DAY WITH MEALS   metroNIDAZOLE (METROCREAM) 0.75 % cream Apply 1 Application topically as needed.   naproxen sodium (ALEVE) 220  MG tablet Take 220 mg by mouth daily as needed (pain).   propranolol  (INDERAL ) 10 MG tablet Take 1 tablet (10 mg total) by mouth daily as needed (tremor).   [DISCONTINUED] Semaglutide ,0.25 or 0.5MG /DOS, (OZEMPIC , 0.25 OR 0.5 MG/DOSE,) 2 MG/1.5ML SOPN Inject 0.25 mg into the skin once a week.   Semaglutide ,0.25 or 0.5MG /DOS, (OZEMPIC , 0.25 OR 0.5 MG/DOSE,) 2 MG/1.5ML SOPN Inject 0.25 mg into the skin once a week.   No facility-administered encounter medications on file as of 11/12/2023.    Review of Systems:  Review of Systems  Constitutional:  Negative for activity change, appetite change, chills, diaphoresis,  fatigue, fever and unexpected weight change.  Respiratory:  Negative for cough, shortness of breath, wheezing and stridor.   Cardiovascular:  Negative for chest pain, palpitations and leg swelling.  Gastrointestinal:  Negative for abdominal distention, abdominal pain, constipation and diarrhea.       Belching  Genitourinary:  Negative for difficulty urinating and dysuria.  Musculoskeletal:  Negative for arthralgias, back pain, gait problem, joint swelling and myalgias.  Neurological:  Positive for tremors. Negative for dizziness, seizures, syncope, facial asymmetry, speech difficulty, weakness and headaches.  Hematological:  Negative for adenopathy. Does not bruise/bleed easily.  Psychiatric/Behavioral:  Negative for agitation, behavioral problems and confusion.     Health Maintenance  Topic Date Due   Diabetic kidney evaluation - Urine ACR  Never done   Hepatitis B Vaccines (2 of 3 - 19+ 3-dose series) 05/01/2002   INFLUENZA VACCINE  11/02/2023   COVID-19 Vaccine (9 - 2024-25 season) 02/29/2024 (Originally 12/03/2022)   OPHTHALMOLOGY EXAM  12/08/2023   Medicare Annual Wellness (AWV)  03/04/2024   HEMOGLOBIN A1C  04/12/2024   Colonoscopy  05/27/2024   FOOT EXAM  10/07/2024   Diabetic kidney evaluation - eGFR measurement  10/10/2024   DTaP/Tdap/Td (3 - Td or Tdap) 10/10/2033   Pneumococcal Vaccine: 50+ Years  Completed   Hepatitis C Screening  Completed   Zoster Vaccines- Shingrix  Completed   HPV VACCINES  Aged Out   Meningococcal B Vaccine  Aged Out    Physical Exam: Vitals:   11/12/23 1401  BP: 130/76  Pulse: 91  Temp: (!) 97.2 F (36.2 C)  SpO2: 95%  Weight: 224 lb 3.2 oz (101.7 kg)  Height: 5' 11 (1.803 m)   Body mass index is 31.27 kg/m. Wt Readings from Last 3 Encounters:  11/12/23 224 lb 3.2 oz (101.7 kg)  10/08/23 228 lb 12.8 oz (103.8 kg)  06/05/23 234 lb 6.4 oz (106.3 kg)    Physical Exam Vitals reviewed.  Constitutional:      Appearance: Normal  appearance.  Cardiovascular:     Rate and Rhythm: Normal rate and regular rhythm.  Pulmonary:     Effort: Pulmonary effort is normal.     Breath sounds: Normal breath sounds.  Abdominal:     General: Bowel sounds are normal. There is no distension.     Palpations: Abdomen is soft.     Tenderness: There is no abdominal tenderness.  Neurological:     Mental Status: He is alert and oriented to person, place, and time.  Psychiatric:        Mood and Affect: Mood normal.     Labs reviewed: Basic Metabolic Panel: Recent Labs    01/30/23 0000 05/29/23 0000 10/11/23 0000  NA 142 139 142  K 4.1 4.0 4.3  CL 106 104 105  CO2 24* 19 20  BUN 13 15 13   CREATININE 0.8 0.9  1.0  CALCIUM  9.0 9.2 9.3   Liver Function Tests: Recent Labs    01/30/23 0000 10/11/23 0000  AST 32 37  ALT 48* 53*  ALKPHOS 57 64  ALBUMIN  4.1 4.4   No results for input(s): LIPASE, AMYLASE in the last 8760 hours. No results for input(s): AMMONIA in the last 8760 hours. CBC: Recent Labs    01/30/23 0000 10/11/23 1131  WBC 6.8  6.8 7.3  HGB 15.6  15.6 15.6  HCT 46  46 47  PLT 254  254 293   Lipid Panel: Recent Labs    10/11/23 0000  CHOL 138  HDL 31*  LDLCALC 83  TRIG 882   Lab Results  Component Value Date   HGBA1C 6.0 10/11/2023    Procedures since last visit: No results found.   Assessment & Plan    Assessment and Plan      Gastrointestinal side effects of GLP-1 agonist therapy Hiccups and burping likely due to GLP-1 agonist therapy.  - Advise dietary modifications to reduce symptoms. -small frequent meals - Consider Pepcid if symptoms worsen.  Type 2 diabetes mellitus, well controlled A1c 6.0 as of July. Discussed potential metformin  dosage reduction if glycemic control maintained. - Continue current diabetes management. - Evaluate metformin  dosage adjustment at next visit.  Essential hypertension, well controlled Blood pressure within normal range. Ozempic   may contribute to lower blood pressure.     Obesity Managed with GLP-1 agonist therapy (Ozempic ). Significant weight loss from 234 lbs to 224 lbs. Current BMI 31.27 Discussed potential for medication's effectiveness to taper off and importance of strength training. - Continue Ozempic  0.25 mg weekly. - Encourage strength training. - Monitor weight and side effects; adjust dosage if needed.    Labs/tests ordered:  * No order type specified * Next appt:  02/05/2024   Total time :  time greater than 50% of total time spent doing pt counseling and coordination of care

## 2023-11-12 NOTE — Patient Instructions (Addendum)
  VISIT SUMMARY: Today, you came in for a follow-up visit to discuss your weight loss and diabetes management. We reviewed your recent weight loss, gastrointestinal symptoms, and current diabetes control.  YOUR PLAN: -OBESITY: Obesity means having an excessive amount of body fat. You have experienced significant weight loss from 234 lbs to 224 lbs, and your current BMI is 31.27. We will continue your current medication, Ozempic  0.25 mg weekly, and encourage you to engage in strength training. We will monitor your weight and any side effects, and adjust the dosage if needed.  -GASTROINTESTINAL SIDE EFFECTS OF GLP-1 AGONIST THERAPY: The hiccups and burping you are experiencing are likely side effects of your current medication, Ozempic . These symptoms are expected to improve over time. We recommend dietary modifications to help reduce these symptoms and may consider adding Pepcid if they worsen.  -TYPE 2 DIABETES MELLITUS, WELL CONTROLLED: Type 2 diabetes is a condition where your body does not use insulin properly. Your A1c is currently 6.0, indicating good control of your diabetes. We will continue with your current diabetes management plan and evaluate the possibility of reducing your metformin  dosage at your next visit.  -ESSENTIAL HYPERTENSION, WELL CONTROLLED: Essential hypertension is high blood pressure with no identifiable cause. Your blood pressure is currently within the normal range, and your medication, Ozempic , may be contributing to this improvement.  INSTRUCTIONS: Please continue with your current medications and follow the dietary recommendations provided. Engage in strength training exercises and monitor your weight and any side effects. Your next follow-up laboratory appointment is scheduled for November to monitor your kidney function and other parameters. We will also evaluate your metformin  dosage at your next visit.                      Contains text generated  by [employer]bridge.                                 Contains text generated by Abridge.

## 2023-12-12 ENCOUNTER — Other Ambulatory Visit: Payer: Self-pay | Admitting: Internal Medicine

## 2023-12-12 DIAGNOSIS — I10 Essential (primary) hypertension: Secondary | ICD-10-CM

## 2024-01-08 LAB — OPHTHALMOLOGY REPORT-SCANNED

## 2024-01-10 ENCOUNTER — Other Ambulatory Visit: Payer: Self-pay | Admitting: Medical Genetics

## 2024-01-31 ENCOUNTER — Other Ambulatory Visit

## 2024-01-31 DIAGNOSIS — Z006 Encounter for examination for normal comparison and control in clinical research program: Secondary | ICD-10-CM

## 2024-02-04 ENCOUNTER — Encounter: Payer: Self-pay | Admitting: Internal Medicine

## 2024-02-05 ENCOUNTER — Encounter: Payer: Self-pay | Admitting: Internal Medicine

## 2024-02-09 LAB — GENECONNECT MOLECULAR SCREEN: Genetic Analysis Overall Interpretation: NEGATIVE

## 2024-02-12 LAB — BASIC METABOLIC PANEL WITH GFR
BUN: 12 (ref 4–21)
CO2: 25 — AB (ref 13–22)
Chloride: 105 (ref 99–108)
Creatinine: 0.9 (ref 0.6–1.3)
Glucose: 91
Potassium: 4.5 meq/L (ref 3.5–5.1)
Sodium: 143 (ref 137–147)

## 2024-02-12 LAB — HEMOGLOBIN A1C: Hemoglobin A1C: 5.5

## 2024-02-12 LAB — COMPREHENSIVE METABOLIC PANEL WITH GFR
Calcium: 9.9 (ref 8.7–10.7)
eGFR: 86

## 2024-02-19 LAB — MICROALBUMIN, URINE
Creatinine, POC: 203 mg/dL
Microalb, Ur: <1.2

## 2024-02-19 LAB — PROTEIN / CREATININE RATIO, URINE: Creatinine, Urine: 203

## 2024-02-26 ENCOUNTER — Ambulatory Visit: Admitting: Internal Medicine

## 2024-02-26 ENCOUNTER — Encounter: Payer: Self-pay | Admitting: Internal Medicine

## 2024-02-26 VITALS — BP 120/82 | HR 80 | Temp 97.2°F | Ht 71.0 in | Wt 207.0 lb

## 2024-02-26 DIAGNOSIS — Z7985 Long-term (current) use of injectable non-insulin antidiabetic drugs: Secondary | ICD-10-CM

## 2024-02-26 DIAGNOSIS — I10 Essential (primary) hypertension: Secondary | ICD-10-CM

## 2024-02-26 DIAGNOSIS — E1165 Type 2 diabetes mellitus with hyperglycemia: Secondary | ICD-10-CM | POA: Diagnosis not present

## 2024-02-26 DIAGNOSIS — G250 Essential tremor: Secondary | ICD-10-CM

## 2024-02-26 DIAGNOSIS — E7800 Pure hypercholesterolemia, unspecified: Secondary | ICD-10-CM

## 2024-02-26 DIAGNOSIS — Z7984 Long term (current) use of oral hypoglycemic drugs: Secondary | ICD-10-CM

## 2024-02-26 MED ORDER — ATORVASTATIN CALCIUM 40 MG PO TABS
40.0000 mg | ORAL_TABLET | Freq: Every day | ORAL | 3 refills | Status: AC
Start: 2024-02-26 — End: ?

## 2024-02-26 MED ORDER — METFORMIN HCL ER 500 MG PO TB24: 500.0000 mg | ORAL_TABLET | Freq: Every day | ORAL | Status: AC

## 2024-02-26 NOTE — Progress Notes (Signed)
 Location:   [facility]    Place of Service: Clinic    Provider:   Code Status:  Goals of Care:     06/05/2023   12:59 PM  Advanced Directives  Does Patient Have a Medical Advance Directive? Yes  Type of Advance Directive Out of facility DNR (pink MOST or yellow form);Living will;Healthcare Power of Attorney  Does patient want to make changes to medical advance directive? No - Patient declined  Copy of Healthcare Power of Attorney in Chart? No - copy requested     Chief Complaint  Patient presents with   Follow-up    4 month follow up    HPI: Patient is a 78 y.o. male seen today for medical management of chronic diseases.   [place] in [place] in [place]  Type 2 diabetes Doing well on Ozempic  No Nausea Any more Has lost more then 20 lbs Tolerating well  Hip pain Saw Ortho was told it is bursitis Resolved now  Essential tremors Takes Propanolol Hypertension BPH Past Medical History:  Diagnosis Date   Allergic rhinitis    Arthritis    Cancer (HCC)    basal cell nose   Carotid atherosclerosis, bilateral    dvt  ?14   Cataract    Removal both eyes    DVT (deep venous thrombosis) (HCC) 11/22/2012   GERD (gastroesophageal reflux disease)    occ   High blood pressure    Hypercholesteremia    Hyperlipidemia    Left atrial dilatation    LVH (left ventricular hypertrophy)    Obesity    Seizures (HCC)    in 20's from aspirin    Past Surgical History:  Procedure Laterality Date   CATARACT EXTRACTION, BILATERAL     COLONOSCOPY  2015   LUMBAR LAMINECTOMY/DECOMPRESSION MICRODISCECTOMY Left 11/30/2016   Procedure: LEFT LUMBAR FOUR-FIVE LAMINECTOMY FOR FACET/SYNOVIAL CYST;  Surgeon: Gillie Duncans, MD;  Location: [facility] OR;  Service: Neurosurgery;  Laterality: Left;  LAMINECTOMY FOR FACET/SYNOVIAL CYST LUMBAR 4-LUMBAR 5, LEFT   right leg fracture s/p surgical repair with screws Right 1966   SKIN CANCER EXCISION  2010   from his nose, no recurrence   TOTAL KNEE ARTHROPLASTY  Right 2015    Allergies  Allergen Reactions   Asa [Aspirin] Other (See Comments)    Seizures     Outpatient Encounter Medications as of 02/26/2024  Medication Sig   acetaminophen  (TYLENOL ) 500 MG tablet Take 500 mg by mouth at bedtime as needed for moderate pain.    atorvastatin  (LIPITOR) 40 MG tablet Take 1 tablet (40 mg total) by mouth daily.   Cholecalciferol  (VITAMIN D3) 2000 units TABS Take 1,000 Units by mouth every evening.   losartan  (COZAAR ) 100 MG tablet TAKE 1 TABLET DAILY   metroNIDAZOLE (METROCREAM) 0.75 % cream Apply 1 Application topically as needed.   naproxen sodium (ALEVE) 220 MG tablet Take 220 mg by mouth daily as needed (pain).   propranolol  (INDERAL ) 10 MG tablet Take 1 tablet (10 mg total) by mouth daily as needed (tremor).   Semaglutide ,0.25 or 0.5MG /DOS, (OZEMPIC , 0.25 OR 0.5 MG/DOSE,) 2 MG/1.5ML SOPN Inject 0.25 mg into the skin once a week.   [DISCONTINUED] atorvastatin  (LIPITOR) 20 MG tablet TAKE 1 TABLET DAILY   [DISCONTINUED] metFORMIN  (GLUCOPHAGE -XR) 500 MG 24 hr tablet TAKE 1 TABLET TWICE A DAY WITH MEALS   metFORMIN  (GLUCOPHAGE -XR) 500 MG 24 hr tablet Take 1 tablet (500 mg total) by mouth daily with breakfast.   No facility-administered encounter medications on file  as of 02/26/2024.    Review of Systems:  Review of Systems  Constitutional:  Negative for activity change, appetite change and unexpected weight change.  HENT: Negative.    Respiratory:  Negative for cough and shortness of breath.   Cardiovascular:  Negative for leg swelling.  Gastrointestinal:  Negative for constipation.  Genitourinary:  Negative for frequency.  Musculoskeletal:  Negative for arthralgias, gait problem and myalgias.  Skin: Negative.  Negative for rash.  Neurological:  Negative for dizziness and weakness.  Psychiatric/Behavioral:  Negative for confusion and sleep disturbance.   All other systems reviewed and are negative.   Health Maintenance  Topic Date Due    Diabetic kidney evaluation - Urine ACR  Never done   Medicare Annual Wellness (AWV)  03/04/2024   Colonoscopy  05/27/2024   COVID-19 Vaccine (10 - 2025-26 season) 06/30/2024   HEMOGLOBIN A1C  08/11/2024   FOOT EXAM  10/07/2024   OPHTHALMOLOGY EXAM  01/07/2025   Diabetic kidney evaluation - eGFR measurement  02/11/2025   DTaP/Tdap/Td (3 - Td or Tdap) 10/10/2033   Pneumococcal Vaccine: 50+ Years  Completed   Influenza Vaccine  Completed   Hepatitis C Screening  Completed   Zoster Vaccines- Shingrix  Completed   Meningococcal B Vaccine  Aged Out   Hepatitis B Vaccines 19-59 Average Risk  Discontinued    Physical Exam: Vitals:   02/26/24 0840  BP: 120/82  Pulse: 80  Temp: (!) 97.2 F (36.2 C)  SpO2: 98%  Weight: 207 lb (93.9 kg)  Height: 5' 11 (1.803 m)   Body mass index is 28.87 kg/m. Physical Exam Vitals reviewed.  Constitutional:      Appearance: Normal appearance.  HENT:     Head: Normocephalic.     Nose: Nose normal.     Mouth/Throat:     Mouth: Mucous membranes are moist.     Pharynx: Oropharynx is clear.  Eyes:     Pupils: Pupils are equal, round, and reactive to light.  Cardiovascular:     Rate and Rhythm: Normal rate and regular rhythm.     Pulses: Normal pulses.     Heart sounds: No murmur heard. Pulmonary:     Effort: Pulmonary effort is normal. No respiratory distress.     Breath sounds: Normal breath sounds. No rales.  Abdominal:     General: Abdomen is flat. Bowel sounds are normal.     Palpations: Abdomen is soft.     Comments: Hernia present in Upper abdominal area  Musculoskeletal:        General: No swelling.     Cervical back: Neck supple.  Skin:    General: Skin is warm.  Neurological:     General: No focal deficit present.     Mental Status: He is alert and oriented to person, place, and time.  Psychiatric:        Mood and Affect: Mood normal.        Thought Content: Thought content normal.     Labs reviewed: Basic Metabolic  Panel: Recent Labs    05/29/23 0000 10/11/23 0000 02/12/24 0000  NA 139 142 143  K 4.0 4.3 4.5  CL 104 105 105  CO2 19 20 25*  BUN 15 13 12   CREATININE 0.9 1.0 0.9  CALCIUM  9.2 9.3 9.9   Liver Function Tests: Recent Labs    10/11/23 0000  AST 37  ALT 53*  ALKPHOS 64  ALBUMIN  4.4   No results for input(s): LIPASE, AMYLASE in the  last 8760 hours. No results for input(s): AMMONIA in the last 8760 hours. CBC: Recent Labs    10/11/23 1131  WBC 7.3  HGB 15.6  HCT 47  PLT 293   Lipid Panel: Recent Labs    10/11/23 0000  CHOL 138  HDL 31*  LDLCALC 83  TRIG 882   Lab Results  Component Value Date   HGBA1C 5.5 02/12/2024    Procedures since last visit: No results found.  Assessment/Plan Assessment and Plan    Type 2 diabetes mellitus Managed with metformin  and Ozempic . Reports decreased appetite and loss of taste, common side effects of Ozempic . Regular bowel movements since starting Ozempic .  - Reduced metformin  to once daily dosing. - Continue Ozempic  as prescribed.  Hyperlipidemia Cholesterol levels slightly elevated, LDL at 83 mg/dL, HDL low at 31 mg/dL. Current atorvastatin  dose may be insufficient.  - Increased atorvastatin  dose to 40 mg daily. - Instructed to double up on current atorvastatin  supply until new prescription is filled.  General Health Maintenance Received COVID and flu vaccinations. Eye exams are current.      Essential hypertension, benign Controlled on Cozaar  Benign essential tremor Propanolol PRn   Labs/tests ordered:  CBC,Cmp,A1C, B 12, TSH Next appt:  Visit date not found

## 2024-07-07 ENCOUNTER — Encounter: Admitting: Adult Health
